# Patient Record
Sex: Male | Born: 1935
Health system: Southern US, Community
[De-identification: ages and names within clinical notes are randomized; demographics above are authoritative.]

## PROBLEM LIST (undated history)

## (undated) DIAGNOSIS — E785 Hyperlipidemia, unspecified: Secondary | ICD-10-CM

## (undated) DIAGNOSIS — C801 Malignant (primary) neoplasm, unspecified: Secondary | ICD-10-CM

## (undated) DIAGNOSIS — J189 Pneumonia, unspecified organism: Secondary | ICD-10-CM

## (undated) DIAGNOSIS — K219 Gastro-esophageal reflux disease without esophagitis: Secondary | ICD-10-CM

## (undated) DIAGNOSIS — M6289 Other specified disorders of muscle: Secondary | ICD-10-CM

## (undated) DIAGNOSIS — Z923 Personal history of irradiation: Secondary | ICD-10-CM

## (undated) DIAGNOSIS — I10 Essential (primary) hypertension: Secondary | ICD-10-CM

## (undated) DIAGNOSIS — K635 Polyp of colon: Secondary | ICD-10-CM

## (undated) DIAGNOSIS — N442 Benign cyst of testis: Secondary | ICD-10-CM

## (undated) HISTORY — PX: KNEE SURGERY: SHX244

## (undated) HISTORY — DX: Personal history of irradiation: Z92.3

## (undated) HISTORY — DX: Hyperlipidemia, unspecified: E78.5

## (undated) HISTORY — DX: Polyp of colon: K63.5

## (undated) HISTORY — PX: LUMBAR LAMINECTOMY: SHX95

## (undated) HISTORY — DX: Gastro-esophageal reflux disease without esophagitis: K21.9

## (undated) HISTORY — PX: CATARACT EXTRACTION: SUR2

## (undated) HISTORY — DX: Essential (primary) hypertension: I10

## (undated) HISTORY — DX: Other specified disorders of muscle: M62.89

## (undated) HISTORY — DX: Pneumonia, unspecified organism: J18.9

## (undated) HISTORY — PX: TRANSURETHRAL RESECTION OF PROSTATE: SHX73

## (undated) HISTORY — DX: Benign cyst of testis: N44.2

## (undated) SURGERY — IMAGING PROCEDURE, GI TRACT, INTRALUMINAL, VIA CAPSULE
Anesthesia: LOCAL

---

## 1998-07-27 ENCOUNTER — Other Ambulatory Visit: Admission: RE | Admit: 1998-07-27 | Discharge: 1998-07-27 | Payer: Self-pay | Admitting: Urology

## 1998-08-16 ENCOUNTER — Inpatient Hospital Stay (HOSPITAL_COMMUNITY): Admission: RE | Admit: 1998-08-16 | Discharge: 1998-08-18 | Payer: Self-pay | Admitting: Urology

## 1999-04-10 ENCOUNTER — Encounter: Payer: Self-pay | Admitting: Specialist

## 1999-04-15 ENCOUNTER — Observation Stay (HOSPITAL_COMMUNITY): Admission: RE | Admit: 1999-04-15 | Discharge: 1999-04-16 | Payer: Self-pay | Admitting: Specialist

## 1999-04-15 ENCOUNTER — Encounter: Payer: Self-pay | Admitting: Specialist

## 1999-04-15 ENCOUNTER — Encounter (INDEPENDENT_AMBULATORY_CARE_PROVIDER_SITE_OTHER): Payer: Self-pay

## 2000-05-19 DIAGNOSIS — J189 Pneumonia, unspecified organism: Secondary | ICD-10-CM

## 2000-05-19 HISTORY — DX: Pneumonia, unspecified organism: J18.9

## 2000-06-11 ENCOUNTER — Inpatient Hospital Stay (HOSPITAL_COMMUNITY): Admission: EM | Admit: 2000-06-11 | Discharge: 2000-06-13 | Payer: Self-pay | Admitting: Emergency Medicine

## 2000-06-11 ENCOUNTER — Encounter: Payer: Self-pay | Admitting: Emergency Medicine

## 2000-06-12 ENCOUNTER — Encounter: Payer: Self-pay | Admitting: Internal Medicine

## 2000-07-09 ENCOUNTER — Encounter (INDEPENDENT_AMBULATORY_CARE_PROVIDER_SITE_OTHER): Payer: Self-pay | Admitting: Specialist

## 2000-07-09 ENCOUNTER — Other Ambulatory Visit: Admission: RE | Admit: 2000-07-09 | Discharge: 2000-07-09 | Payer: Self-pay | Admitting: Internal Medicine

## 2002-06-28 ENCOUNTER — Ambulatory Visit (HOSPITAL_COMMUNITY): Admission: RE | Admit: 2002-06-28 | Discharge: 2002-06-28 | Payer: Self-pay | Admitting: Internal Medicine

## 2003-11-06 ENCOUNTER — Encounter: Payer: Self-pay | Admitting: Internal Medicine

## 2003-11-16 ENCOUNTER — Encounter: Payer: Self-pay | Admitting: Internal Medicine

## 2004-05-27 ENCOUNTER — Ambulatory Visit: Payer: Self-pay | Admitting: Internal Medicine

## 2005-09-19 ENCOUNTER — Ambulatory Visit: Payer: Self-pay | Admitting: Internal Medicine

## 2005-10-03 ENCOUNTER — Ambulatory Visit: Payer: Self-pay | Admitting: Internal Medicine

## 2005-11-17 ENCOUNTER — Ambulatory Visit: Payer: Self-pay | Admitting: Internal Medicine

## 2006-04-21 ENCOUNTER — Ambulatory Visit: Payer: Self-pay | Admitting: Internal Medicine

## 2006-10-21 ENCOUNTER — Ambulatory Visit: Payer: Self-pay | Admitting: Internal Medicine

## 2007-04-29 ENCOUNTER — Ambulatory Visit: Payer: Self-pay | Admitting: Internal Medicine

## 2007-04-29 DIAGNOSIS — Z860100 Personal history of colon polyps, unspecified: Secondary | ICD-10-CM | POA: Insufficient documentation

## 2007-04-29 DIAGNOSIS — I10 Essential (primary) hypertension: Secondary | ICD-10-CM

## 2007-04-29 DIAGNOSIS — E785 Hyperlipidemia, unspecified: Secondary | ICD-10-CM

## 2007-04-29 DIAGNOSIS — K219 Gastro-esophageal reflux disease without esophagitis: Secondary | ICD-10-CM

## 2007-04-30 LAB — CONVERTED CEMR LAB
ALT: 21 units/L (ref 0–53)
AST: 24 units/L (ref 0–37)
Albumin: 3.8 g/dL (ref 3.5–5.2)
Alkaline Phosphatase: 47 units/L (ref 39–117)
BUN: 11 mg/dL (ref 6–23)
Bilirubin, Direct: 0.1 mg/dL (ref 0.0–0.3)
CO2: 29 meq/L (ref 19–32)
Calcium: 9 mg/dL (ref 8.4–10.5)
Chloride: 105 meq/L (ref 96–112)
Cholesterol: 179 mg/dL (ref 0–200)
Creatinine, Ser: 1.1 mg/dL (ref 0.4–1.5)
GFR calc Af Amer: 85 mL/min
GFR calc non Af Amer: 70 mL/min
Glucose, Bld: 97 mg/dL (ref 70–99)
HDL: 31.7 mg/dL — ABNORMAL LOW (ref >39.0)
LDL Cholesterol: 119 mg/dL — ABNORMAL HIGH (ref 0–99)
Potassium: 4.5 meq/L (ref 3.5–5.1)
Sodium: 141 meq/L (ref 135–145)
Total Bilirubin: 0.8 mg/dL (ref 0.3–1.2)
Total CHOL/HDL Ratio: 5.6
Total Protein: 6.6 g/dL (ref 6.0–8.3)
Triglycerides: 144 mg/dL (ref 0–149)
VLDL: 29 mg/dL (ref 0–40)

## 2007-10-29 ENCOUNTER — Ambulatory Visit: Payer: Self-pay | Admitting: Internal Medicine

## 2008-02-02 ENCOUNTER — Encounter: Payer: Self-pay | Admitting: Internal Medicine

## 2008-04-28 ENCOUNTER — Ambulatory Visit: Payer: Self-pay | Admitting: Internal Medicine

## 2008-05-01 LAB — CONVERTED CEMR LAB
ALT: 18 units/L (ref 0–53)
AST: 21 units/L (ref 0–37)
Albumin: 4.1 g/dL (ref 3.5–5.2)
Alkaline Phosphatase: 38 units/L — ABNORMAL LOW (ref 39–117)
BUN: 22 mg/dL (ref 6–23)
Bilirubin, Direct: 0.1 mg/dL (ref 0.0–0.3)
CO2: 30 meq/L (ref 19–32)
Calcium: 9.4 mg/dL (ref 8.4–10.5)
Chloride: 101 meq/L (ref 96–112)
Cholesterol: 174 mg/dL (ref 0–200)
Creatinine, Ser: 1.3 mg/dL (ref 0.4–1.5)
Direct LDL: 104.9 mg/dL
GFR calc Af Amer: 70 mL/min
GFR calc non Af Amer: 58 mL/min
Glucose, Bld: 88 mg/dL (ref 70–99)
HDL: 31.9 mg/dL — ABNORMAL LOW (ref >39.0)
PSA: 0.93 ng/mL (ref 0.10–4.00)
Potassium: 4.5 meq/L (ref 3.5–5.1)
Sodium: 139 meq/L (ref 135–145)
Total Bilirubin: 0.7 mg/dL (ref 0.3–1.2)
Total CHOL/HDL Ratio: 5.5
Total Protein: 7.1 g/dL (ref 6.0–8.3)
Triglycerides: 227 mg/dL (ref 0–149)
VLDL: 45 mg/dL — ABNORMAL HIGH (ref 0–40)

## 2008-09-06 ENCOUNTER — Encounter (INDEPENDENT_AMBULATORY_CARE_PROVIDER_SITE_OTHER): Payer: Self-pay | Admitting: *Deleted

## 2008-10-24 ENCOUNTER — Ambulatory Visit: Payer: Self-pay | Admitting: Internal Medicine

## 2008-11-07 ENCOUNTER — Ambulatory Visit: Payer: Self-pay | Admitting: Internal Medicine

## 2009-04-27 ENCOUNTER — Encounter: Payer: Self-pay | Admitting: Internal Medicine

## 2009-12-24 ENCOUNTER — Ambulatory Visit: Payer: Self-pay | Admitting: Internal Medicine

## 2009-12-25 LAB — CONVERTED CEMR LAB
ALT: 16 units/L (ref 0–53)
AST: 19 units/L (ref 0–37)
Albumin: 4.3 g/dL (ref 3.5–5.2)
Alkaline Phosphatase: 40 units/L (ref 39–117)
BUN: 21 mg/dL (ref 6–23)
Basophils Absolute: 0 10*3/uL (ref 0.0–0.1)
Basophils Relative: 0.2 % (ref 0.0–3.0)
Bilirubin, Direct: 0.1 mg/dL (ref 0.0–0.3)
CO2: 30 meq/L (ref 19–32)
Calcium: 9.1 mg/dL (ref 8.4–10.5)
Chloride: 104 meq/L (ref 96–112)
Cholesterol: 167 mg/dL (ref 0–200)
Creatinine, Ser: 1.2 mg/dL (ref 0.4–1.5)
Direct LDL: 93.8 mg/dL
Eosinophils Absolute: 0.1 10*3/uL (ref 0.0–0.7)
Eosinophils Relative: 2.1 % (ref 0.0–5.0)
GFR calc non Af Amer: 61.84 mL/min (ref >60)
Glucose, Bld: 97 mg/dL (ref 70–99)
HCT: 42.1 % (ref 39.0–52.0)
HDL: 36.2 mg/dL — ABNORMAL LOW (ref >39.00)
Hemoglobin: 14.4 g/dL (ref 13.0–17.0)
Lymphocytes Relative: 33.5 % (ref 12.0–46.0)
Lymphs Abs: 2.2 10*3/uL (ref 0.7–4.0)
MCHC: 34.2 g/dL (ref 30.0–36.0)
MCV: 96.8 fL (ref 78.0–100.0)
Monocytes Absolute: 0.4 10*3/uL (ref 0.1–1.0)
Monocytes Relative: 6.7 % (ref 3.0–12.0)
Neutro Abs: 3.7 10*3/uL (ref 1.4–7.7)
Neutrophils Relative %: 57.5 % (ref 43.0–77.0)
PSA: 1.01 ng/mL (ref 0.10–4.00)
Platelets: 208 10*3/uL (ref 150.0–400.0)
Potassium: 4.6 meq/L (ref 3.5–5.1)
RBC: 4.35 M/uL (ref 4.22–5.81)
RDW: 13 % (ref 11.5–14.6)
Sodium: 143 meq/L (ref 135–145)
TSH: 2.77 microintl units/mL (ref 0.35–5.50)
Total Bilirubin: 0.6 mg/dL (ref 0.3–1.2)
Total CHOL/HDL Ratio: 5
Total Protein: 6.7 g/dL (ref 6.0–8.3)
Triglycerides: 209 mg/dL — ABNORMAL HIGH (ref 0.0–149.0)
VLDL: 41.8 mg/dL — ABNORMAL HIGH (ref 0.0–40.0)
WBC: 6.5 10*3/uL (ref 4.5–10.5)

## 2010-06-18 NOTE — Letter (Signed)
Summary: Request for Surgical Clearance/[facility]  Request for Surgical Clearance/[facility]   Imported By: Maryln Gottron 06/01/2009 09:26:00  _____________________________________________________________________  External Attachment:    Type:   Image     Comment:   External Document

## 2010-06-18 NOTE — Assessment & Plan Note (Signed)
Summary: emp/pt coming in fasting/cjr   Vital Signs:  Patient profile:   76 year old male Height:      71.5 inches Weight:      200 pounds BMI:     27.60 Pulse rate:   60 / minute Pulse rhythm:   regular Resp:     12 per minute BP sitting:   144 / 78  (left arm) Cuff size:   regular  Vitals Entered By: Gladis Riffle, RN (December 24, 2009 9:51 AM) CC: annual review of systems, fasting--needs Rx prilosec and HCTZ Is Patient Diabetic? No   CC:  annual review of systems and fasting--needs Rx prilosec and HCTZ.  History of Present Illness: Here for Medicare AWV:  1.   Risk factors based on Past M, S, F history: see list 2.   Physical Activities: --able to do all activities 3.   Depression/mood: ---denies 4.   Hearing: --denies complaints 5.   ADL's: --able to do all  6.   Fall Risk: --none noted 7.   Home Safety: ---no concerns 8.   Height, weight, &visual acuity:---see exam---q 6 month ophthalmologic exam 9.   Counseling: --advised regular exercise---he walks regularly 10.   Labs ordered based on risk factors: --see orders 11.           Referral Coordination---none necessary 12.           Care Plan--- advised regular exercise 13.            Cognitive Assessment---pt  is alert, motor and sensory functions are intact  Current Problems:  HYPERLIPIDEMIA (ICD-272.4): chronically low hdl despite normal weight and regular exercise HYPERTENSION (ICD-401.9): tolerating meds without difficulty GERD (ICD-530.81): no sxs on PPI    All other systems reviewed and were negative    Preventive Screening-Counseling & Management  Alcohol-Tobacco     Smoking Status: quit  Current Problems (verified): 1)  Special Screening Malignant Neoplasm of Prostate  (ICD-V76.44) 2)  Hyperlipidemia  (ICD-272.4) 3)  Hypertension  (ICD-401.9) 4)  Gerd  (ICD-530.81) 5)  Colonic Polyps, Hx of  (ICD-V12.72)  Current Medications (verified): 1)  Prilosec Otc 20 Mg  Tbec (Omeprazole Magnesium) .... Take  1 Tablet By Mouth Once A Day 2)  Hydrochlorothiazide 25 Mg  Tabs (Hydrochlorothiazide) .... .1/2tablet Once Daily 3)  Centrum Silver   Tabs (Multiple Vitamins-Minerals) .... Once Daily 4)  Vitamin C Cr 500 Mg  Tbcr (Ascorbic Acid) .... Once Daily 5)  Fish Oil   Oil (Fish Oil) .... Once Daily 6)  Icaps  Caps (Multiple Vitamins-Minerals) .... Two Daily 7)  Antioxidant A/c/e   Tabs (Multiple Vitamin)  Allergies (verified): No Known Drug Allergies  Past History:  Past Medical History: Colonic polyps, hx of GERD Hypertension pnueumonia 2002 Hyperlipidemia macular degeneration  Past Surgical History: knee surgery testicular cyst Lumbar laminectomy Transurethral resection of prostate Cataract extraction--left eye  Physical Exam  General:  Well-developed,well-nourished,in no acute distress; alert,appropriate and cooperative throughout examination Head:  normocephalic and atraumatic.   Eyes:  pupils equal and pupils round.   Ears:  R ear normal and L ear normal.   Nose:  no external deformity and no external erythema.   Neck:  No deformities, masses, or tenderness noted. Lungs:  normal respiratory effort and no intercostal retractions.   Heart:  normal rate and regular rhythm.   Abdomen:  soft and non-tender.   Msk:  No deformity or scoliosis noted of thoracic or lumbar spine.   Pulses:  R radial  normal and L radial normal.   Neurologic:  cranial nerves II-XII intact and gait normal.   Skin:  turgor normal and color normal.   Cervical Nodes:  no anterior cervical adenopathy and no posterior cervical adenopathy.   Psych:  memory intact for recent and remote and good eye contact.     Impression & Recommendations:  Problem # 1:  HYPERLIPIDEMIA (ICD-272.4)  real issue is HDL continue regular exercise  Labs Reviewed: SGOT: 21 (04/28/2008)   SGPT: 18 (04/28/2008)   HDL:31.9 (04/28/2008), 31.7 (04/29/2007)  LDL:DEL (04/28/2008), 119 (04/29/2007)  Chol:174 (04/28/2008), 179  (04/29/2007)  Trig:227 (04/28/2008), 144 (04/29/2007)  Orders: TLB-Lipid Panel (80061-LIPID) TLB-Hepatic/Liver Function Pnl (80076-HEPATIC) TLB-TSH (Thyroid Stimulating Hormone) (84443-TSH)  Problem # 2:  HYPERTENSION (ICD-401.9)  reasonable control continue current medications  His updated medication list for this problem includes:    Hydrochlorothiazide 25 Mg Tabs (Hydrochlorothiazide) ..... .1/2tablet once daily  BP today: 144/78 Prior BP: 110/80 (04/28/2008)  Labs Reviewed: K+: 4.5 (04/28/2008) Creat: : 1.3 (04/28/2008)   Chol: 174 (04/28/2008)   HDL: 31.9 (04/28/2008)   LDL: DEL (04/28/2008)   TG: 227 (04/28/2008)  Orders: TLB-BMP (Basic Metabolic Panel-BMET) (80048-METABOL)  Problem # 3:  GERD (ICD-530.81)  well controlled continue current medications  His updated medication list for this problem includes:    Prilosec Otc 20 Mg Tbec (Omeprazole magnesium) .Marland Kitchen... Take 1 tablet by mouth once a day  Orders: TLB-CBC Platelet - w/Differential (85025-CBCD)  Problem # 4:  PREVENTIVE HEALTH CARE (ICD-V70.0) health maint utd check labs today Colonoscopy: Location:  [facility].   (11/07/2008) Flu Vax: fluvirin (02/01/2008)   Pneumovax: pt's report (05/19/2002) Chol: 174 (04/28/2008)   HDL: 31.9 (04/28/2008)   LDL: DEL (04/28/2008)   TG: 227 (04/28/2008) PSA: 0.93 (04/28/2008) Next Colonoscopy due:: 11/2015 (11/07/2008)  Complete Medication List: 1)  Prilosec Otc 20 Mg Tbec (Omeprazole magnesium) .... Take 1 tablet by mouth once a day 2)  Hydrochlorothiazide 25 Mg Tabs (Hydrochlorothiazide) .... .1/2tablet once daily 3)  Centrum Silver Tabs (Multiple vitamins-minerals) .... Once daily 4)  Vitamin C Cr 500 Mg Tbcr (Ascorbic acid) .... Once daily 5)  Fish Oil Oil (Fish oil) .... Once daily 6)  Icaps Caps (Multiple vitamins-minerals) .... Two daily 7)  Antioxidant A/c/e Tabs (Multiple vitamin)  Other Orders: UA Dipstick w/o Micro (automated)   (81003) TLB-PSA (Prostate Specific Antigen) (84153-PSA)  Prescriptions: HYDROCHLOROTHIAZIDE 25 MG  TABS (HYDROCHLOROTHIAZIDE) .1/2tablet once daily  #90 x 3   Entered and Authorized by:   Birdie Sons MD   Signed by:   Birdie Sons MD on 12/24/2009   Method used:   Print then Give to Patient   RxID:   [id number] PRILOSEC OTC 20 MG  TBEC (OMEPRAZOLE MAGNESIUM) Take 1 tablet by mouth once a day  #90 x 3   Entered and Authorized by:   Birdie Sons MD   Signed by:   Birdie Sons MD on 12/24/2009   Method used:   Print then Give to Patient   RxID:   [id number]   Appended Document: emp/pt coming in fasting/cjr   Immunizations Administered:  Tetanus Vaccine:    Vaccine Type: Td    Site: left deltoid    Mfr: Sanofi Pasteur    Dose: 0.5 ml    Route: IM    Given by: Gladis Riffle, RN    Exp. Date: 07/21/2010    Lot #: [id number]   Appended Document: Orders Update    Clinical Lists Changes  Orders: Added new  Service order of Specimen Handling ([id number]) - Signed      Appended Document: emp/pt coming in fasting/cjr  Laboratory Results   Urine Tests    Routine Urinalysis   Color: yellow Appearance: Clear Glucose: negative   (Normal Range: Negative) Bilirubin: negative   (Normal Range: Negative) Ketone: negative   (Normal Range: Negative) Spec. Gravity: 1.020   (Normal Range: 1.003-1.035) Blood: negative   (Normal Range: Negative) pH: 6.0   (Normal Range: 5.0-8.0) Protein: negative   (Normal Range: Negative) Urobilinogen: 0.2   (Normal Range: 0-1) Nitrite: negative   (Normal Range: Negative) Leukocyte Esterace: negative   (Normal Range: Negative)    Comments: Rita Ohara  December 24, 2009 3:51 PM

## 2010-10-04 NOTE — Op Note (Signed)
   NAME:  LOGON, UTTECH                        ACCOUNT NO.:  [id number]   MEDICAL RECORD NO.:  [id number]                   PATIENT TYPE:  AMB   LOCATION:  ENDO                                 FACILITY:  [facility]   PHYSICIAN:  Lina Sar, M.D. [facility]               DATE OF BIRTH:  [birth year]   DATE OF PROCEDURE:  DATE OF DISCHARGE:                                 OPERATIVE REPORT   NAME OF PROCEDURE:  Flexible sigmoidoscopy and banding of hemorrhoids.   INDICATIONS:  This 76 year old gentleman has had symptomatic hemorrhoids  which have not responded to conservative measures.  These were found on last  colonoscopy in February 2002.  He has been treated with Anusol suppositories  and reexamination in December 2003 and in January 2004 hemorrhoids were  bleeding, first to second grade internal hemorrhoids.  He is now undergoing  banding after failure of conservative treatment.   ENDOSCOPE:  Olympus single channel video endoscope.   SEDATION:  Versed 5 mg IV, Demerol 25 mg IV.   FINDINGS:  Olympus single channel video endoscope passed under direct vision  through rectum to sigmoid colon.  The patient was monitored by pulse  oximeter.  His oxygen saturations were normal.  Prep was excellent.  Anal  canal was normal with first to second grade internal hemorrhoids.  At least  three or four of them were seen on retroflexed view of the rectal ampulla.  The sigmoid colon itself was normal up to 35 cm.  Endoscope was then brought  back into the rectum, retroflexed, and using a 6-0 Furniture conservator/restorer, four of the hemorrhoids were banded from retroflex view.  There was  minimal amount of bleeding from the banded hemorrhoids.  Reinspection of the  rectal ampulla confirmed successful banding of the four hemorrhoids.  The  patient tolerated the procedure well.   IMPRESSION:  Second grade internal hemorrhoids status post banding, band  ligation.   PLAN:  1. Anusol suppositories q.h.s.  x1 week.  2. Reinspection of the hemorrhoids in six weeks.  This may be a two-stage     procedure to make sure that all hemorrhoids have been banded.                                               Lina Sar, M.D. [facility]    DB/MEDQ  D:  06/28/2002  T:  06/28/2002  Job:  [id number]

## 2010-10-04 NOTE — H&P (Signed)
[facility]  Patient:    Tim Ross, Tim Ross                    MRN: [id number] Adm. Date:  06/11/00 Attending:  Titus Dubin. Alwyn Ren, M.D. [facility]                         History and Physical  REASON FOR ADMISSION:  Tim Ross is a 76 year old white male admitted with FUO.  HISTORY OF PRESENT ILLNESS:  His symptoms began approximately 10 days ago with onset of diarrhea for two days.  Following frequent bowel movements, he did have blood on toilet tissue, but denied rectal bleeding or melena.  He has had intermittent fever since.  In the last 24 hours his fevers have been 102 to 103.4, associated with rigor and chills.  For the last 2-3 days he has had shortness of breath without cough or sputum production.  He denied any other localizing signs or symptoms related to the respiratory tract, GI system or genitourinary system.  Specifically, there have been no purulent secretions, rash or localized erythema or edema.  PAST SURGICAL HISTORY:  He has had back surgery.  He also had knee surgery bilaterally.  On the left knee the procedure was an open procedure.  He has also had "testicular cyst" removed.  MEDICATIONS:  Presently he is on Nexium which was begun June 08, 2000 when he was seen by Dr. Arthur Holms with some epigastric pain.  ALLERGIES:  No known drug allergies.  SOCIAL HISTORY:  He no longer smokes or drinks.  FAMILY HISTORY:  Positive for myocardial infarction in his father, diabetes in his mother and cancer of the colon in an aunt.  He has had colon polyps removed by Dr. Lina Sar.  PHYSICAL EXAMINATION:  GENERAL:  On exam at this time he appears somewhat weak but in no acute distress.  VITAL SIGNS:  At the time of admission his temperature was 101.6, respiratory rate 22, pulse 116 and blood pressure 137/79.  He received Motrin with resolution of his fever.  His pulse is now 65 and regular.  Blood pressure is now 102/65.  O2 saturations are  96% on room air.  HEENT:  Fundal exam is normal.  Thyroid is small.  Otolaryngologic exam is unremarkable.  NECK:  He has no lymphadenopathy about the head, neck or axilla.  There are no carotid bruits.  CHEST:  Essentially clear with only minimal rales at the bases.  Dr. ______ stated he had heard rales, which apparently have cleared.  CARDIOVASCULAR:  He has an S4 without significant murmur.  ABDOMEN:  Nontender with normal bowel sounds.  There is no abdominal tenderness to deep palpation.  GENITOURINARY:  There are some minor varicocele changes in the right scrotum. There is a suggestion of a possible prostatic nodule on the right.  RECTAL:  There is no periprostate tenderness or pain on rectal exam.  EXTREMITIES:  Pedal pulses are intact but slightly decreased.  He has no edema.  Homans sign is negative.  SKIN:  No significant skin lesion were noted.  LABORATORY DATA:  His PO2 on 2 liters was 68 with a PCO2 of 36.  Urine revealed 0-5 white cells and 6-7 red cells.  White count is 7000 with 9% monos.  Potassium is 3.4, glucose 133 and calcium 8.3.  The remainder of the labs are normal.  ADMITTING IMPRESSION: 1. Fever of unknown origin. 2. Hypoxemia. 3. Diarrhea,  resolved.  This may represent a self-limited diverticulitis, but at this time there ear no localizing signs or symptoms, and as stated, the diarrhea has resolved.  He will be admitted for monitoring with blood cultures for temperature spikes. He has been started on Tequin and this will be continued.  If fevers persist, then a 2-D echocardiogram will be performed, although there is no murmur at this time.  Additionally, a follow-up film will be performed after he has been hydrated, as he has been febrile intermittently for 10 days.  ADDENDUM:  He has well water, which his wife drinks as well.  She is well. They have no sick pets and there is no significant travel exposure. DD:  06/11/00 TD:  06/11/00 Job:  [id number]

## 2010-10-04 NOTE — Discharge Summary (Signed)
[facility]  Patient:    Tim Ross, Tim Ross                     MRN: [id number] Adm. Date:  [id number] Disc. Date: [id number] Attending:  Dolores Patty CC:         Titus Dubin. Alwyn Ren, M.D. [facility]   Discharge Summary  DISCHARGE DIAGNOSES: 1. Right lower lobe pneumonia. 2. Fever secondary to above. 3. History of back surgery. 4. Bilateral knee surgery. 5. History of a testicular cyst removal.  DISCHARGE MEDICATIONS: 1. Nexium 40 mg p.o. q.d. 2. Tequin 400 mg p.o. q.d.  DISCHARGE CONDITION:  Improved.  FOLLOW-UP PLANS:  Dr. Alwyn Ren in two to three weeks (chest x-ray may need to be repeated).  HOSPITAL PROCEDURES:  Chest x-ray on June 12, 2000, demonstrated possible right lower lobe pneumonia.  The streaky opacities seen in the right lower lobe were not present on the admission chest x-ray of June 11, 2000.  Hospital laboratories:  Urine and blood cultures negative x 1 day.  PSA 0.88, free PSA 0.2%, free is 23%.  Urine microscopy: 6-10 red blood cells per high power field.  CBC on admission.  Urinalysis on admission demonstrated a trace ketones, moderate blood.  HOSPITAL COURSE:  The patient was admitted to the hospital service on June 11, 2000, by Dr. Alwyn Ren.  See his hospital note for details.  He was admitted with possible FUO.  The patient had appropriate studies performed, and then was started on IV Tequin.  The patient started to feel markedly better on June 12, 2000.  At that time, he was tapered off of his oxygen, medications changed to oral antibiotics.  Chest x-ray revealed right lower lobe pneumonia. At the time of discharge, the patient had no complaints, was well and was ambulating and eating without difficulty.  Genitourinary:  The patient was apparently noted to have a prostatic nodule on examination by Dr. Alwyn Ren.  PSA as above.  He did have 6-10 red blood cells per high power field.  This can be followed up as  an outpatient. DD:  06/13/00 TD:  06/13/00 Job: [id number]

## 2010-12-20 ENCOUNTER — Encounter: Payer: Self-pay | Admitting: Internal Medicine

## 2010-12-30 ENCOUNTER — Ambulatory Visit (INDEPENDENT_AMBULATORY_CARE_PROVIDER_SITE_OTHER): Payer: Medicare Other | Admitting: Internal Medicine

## 2010-12-30 ENCOUNTER — Encounter: Payer: Self-pay | Admitting: Internal Medicine

## 2010-12-30 DIAGNOSIS — Z0000 Encounter for general adult medical examination without abnormal findings: Secondary | ICD-10-CM

## 2010-12-30 DIAGNOSIS — K219 Gastro-esophageal reflux disease without esophagitis: Secondary | ICD-10-CM

## 2010-12-30 DIAGNOSIS — Z125 Encounter for screening for malignant neoplasm of prostate: Secondary | ICD-10-CM

## 2010-12-30 DIAGNOSIS — Z23 Encounter for immunization: Secondary | ICD-10-CM

## 2010-12-30 DIAGNOSIS — E785 Hyperlipidemia, unspecified: Secondary | ICD-10-CM

## 2010-12-30 DIAGNOSIS — I10 Essential (primary) hypertension: Secondary | ICD-10-CM

## 2010-12-30 LAB — CBC WITH DIFFERENTIAL/PLATELET
Basophils Relative: 0.2 % (ref 0.0–3.0)
Eosinophils Absolute: 0.2 10*3/uL (ref 0.0–0.7)
MCHC: 33.5 g/dL (ref 30.0–36.0)
MCV: 96.7 fl (ref 78.0–100.0)
Monocytes Absolute: 0.4 10*3/uL (ref 0.1–1.0)
Neutrophils Relative %: 60.1 % (ref 43.0–77.0)
Platelets: 216 10*3/uL (ref 150.0–400.0)
RBC: 4.48 Mil/uL (ref 4.22–5.81)
RDW: 12.6 % (ref 11.5–14.6)

## 2010-12-30 LAB — HEPATIC FUNCTION PANEL
ALT: 20 U/L (ref 0–53)
Bilirubin, Direct: 0.1 mg/dL (ref 0.0–0.3)
Total Bilirubin: 0.9 mg/dL (ref 0.3–1.2)
Total Protein: 6.8 g/dL (ref 6.0–8.3)

## 2010-12-30 LAB — LIPID PANEL
HDL: 39.9 mg/dL (ref >39.00)
LDL Cholesterol: 112 mg/dL — ABNORMAL HIGH (ref 0–99)
VLDL: 21.6 mg/dL (ref 0.0–40.0)

## 2010-12-30 LAB — POCT URINALYSIS DIPSTICK
Glucose, UA: NEGATIVE
Ketones, UA: NEGATIVE
Leukocytes, UA: NEGATIVE
Protein, UA: NEGATIVE
Urobilinogen, UA: 0.2

## 2010-12-30 LAB — TSH: TSH: 3.49 u[IU]/mL (ref 0.35–5.50)

## 2010-12-30 LAB — PSA: PSA: 1.2 ng/mL (ref 0.10–4.00)

## 2010-12-30 LAB — BASIC METABOLIC PANEL
Calcium: 9.1 mg/dL (ref 8.4–10.5)
Creatinine, Ser: 1.3 mg/dL (ref 0.4–1.5)
Sodium: 138 mEq/L (ref 135–145)

## 2010-12-30 MED ORDER — OMEPRAZOLE 20 MG PO CPDR: 20.0000 mg | DELAYED_RELEASE_CAPSULE | Freq: Every day | ORAL | Status: DC

## 2010-12-30 MED ORDER — HYDROCHLOROTHIAZIDE 25 MG PO TABS: 25.0000 mg | ORAL_TABLET | Freq: Every day | ORAL | Status: DC

## 2010-12-30 NOTE — Assessment & Plan Note (Signed)
No sxs Check labs today 

## 2010-12-30 NOTE — Assessment & Plan Note (Signed)
Adequate control--check labs today

## 2010-12-30 NOTE — Progress Notes (Signed)
Subjective:    Tim Ross is a 76 y.o. male who presents for Medicare Initial preventive examination.   In addition. Pt has the following problems: Patient Active Problem List  Diagnoses  . HYPERLIPIDEMIA---no treatment  . HYPERTENSION--tolerating hctz  . GERD--needs Rx    Preventive Screening-Counseling & Management  Tobacco History  Smoking status  . Former Smoker -- 40 years  . Types: Cigarettes  . Quit date: 12/30/1990  Smokeless tobacco  . Not on file    Problems Prior to Visit 1.   Current Problems (verified) Patient Active Problem List  Diagnoses  . HYPERLIPIDEMIA  . HYPERTENSION  . GERD  . COLONIC POLYPS, HX OF    Medications Prior to Visit Current Outpatient Prescriptions on File Prior to Visit  Medication Sig Dispense Refill  . fish oil-omega-3 fatty acids 1000 MG capsule Take 1 g by mouth daily.        . Multiple Vitamin (MULTIVITAMIN) tablet Take 1 tablet by mouth daily.        . vitamin C (ASCORBIC ACID) 500 MG tablet Take 500 mg by mouth daily.          Current Medications (verified) Current Outpatient Prescriptions  Medication Sig Dispense Refill  . fish oil-omega-3 fatty acids 1000 MG capsule Take 1 g by mouth daily.        . hydrochlorothiazide 25 MG tablet Take 1 tablet (25 mg total) by mouth daily.  90 tablet  3  . Multiple Vitamin (MULTIVITAMIN) tablet Take 1 tablet by mouth daily.        Marland Kitchen omeprazole (PRILOSEC) 20 MG capsule Take 1 capsule (20 mg total) by mouth daily.  90 capsule  3  . vitamin C (ASCORBIC ACID) 500 MG tablet Take 500 mg by mouth daily.           Allergies (verified) Review of patient's allergies indicates no known allergies.   PAST HISTORY  Family History Family History  Problem Relation Age of Onset  . Diabetes Mother   . Hypertension Mother   . Heart attack Father   . Heart disease Brother     Social History History  Substance Use Topics  . Smoking status: Former Smoker -- 40 years    Types:  Cigarettes    Quit date: 12/30/1990  . Smokeless tobacco: Not on file  . Alcohol Use: No    Are there smokers in your home (other than you)?  No  Risk Factors Current exercise habits: walks every morning  Dietary issues discussed: he eats a gealthy diet   Cardiac risk factors: advanced age (older than 67 for men, 22 for women) and dyslipidemia.  Depression Screen (Note: if answer to either of the following is "Yes", a more complete depression screening is indicated)   Q1: Over the past two weeks, have you felt down, depressed or hopeless? No Activities of Daily Living In your present state of health, do you have any difficulty performing the following activities?:  Driving?no Managing money?  no Feeding yourself? no Getting from bed to chair? no Climbing a flight of stairs? no Preparing food and eating?: No Bathing or showering? No   Hearing Difficulties: No Do you feel that you have a problem with memory? No Cognitive Testing  Alert? Yes  Normal Appearance?Yes  Oriented to person? Yes  Place? Yes   Time? Yes Advanced Directives have been discussed with the patient? No  Immunization History  Administered Date(s) Administered  . Influenza Whole 02/17/2007, 02/01/2008, 02/16/2010  .  Td 12/24/2009  . Zoster 05/19/2005    Screening Tests Health Maintenance  Topic Date Due  . Pneumococcal Polysaccharide Vaccine Age 71 And Over  09/15/2001  . Influenza Vaccine  02/17/2011  . Colonoscopy  11/08/2018  . Tetanus/tdap  12/25/2019  . Zostavax  Completed    All answers were reviewed with the patient and necessary referrals were made:  Judie Petit, MD   12/30/2010   History reviewed: allergies, current medications, past family history, past medical history, past social history, past surgical history and problem list  Review of Systems  patient denies chest pain, shortness of breath, orthopnea. Denies lower extremity edema, abdominal pain, change in appetite, change in  bowel movements. Patient denies rashes, musculoskeletal complaints. No other specific complaints in a complete review of systems. Tick bite May...still has as nodule which can be pruritic  Objective:      Blood pressure 142/84, pulse 68, temperature 97.1 F (36.2 C), temperature source Oral, height 6' (1.829 m), weight 196 lb (88.905 kg). Body mass index is 26.58 kg/(m^2).     Well-developed male in no acute distress. HEENT exam atraumatic, normocephalic, extraocular muscles are intact. Conjunctivae are pink without exudate. Neck is supple without lymphadenopathy, thyromegaly, jugular venous distention. Chest is clear to auscultation without increased work of breathing. Cardiac exam S1-S2 are regular. The PMI is normal. No significant murmurs or gallops. Abdominal exam active bowel sounds, soft, nontender. No abdominal bruits. Extremities no clubbing cyanosis or edema. Peripheral pulses are normal without bruits. Neurologic exam alert and oriented without any motor or sensory deficits. Rectal exam normal tone prostate normal size without masses or asymmetry.  Assessment:  Well visit        Plan:     During the course of the visit the patient was educated and counseled about appropriate screening and preventive services including:    Pneumococcal vaccine   Influenza vaccine  Td vaccine  Prostate cancer screening  Colorectal cancer screening  Diet review for nutrition referral? Yes ____  Not Indicated ___X_   Patient Instructions (the written plan) was given to the patient.  Medicare Attestation I have personally reviewed: The patient's medical and social history Their use of alcohol, tobacco or illicit drugs Their current medications and supplements The patient's functional ability including ADLs,fall risks, home safety risks, cognitive, and hearing and visual impairment Diet and physical activities Evidence for depression or mood disorders  The patient's weight,  height, BMI, and visual acuity have been recorded in the chart.  I have made referrals, counseling, and provided education to the patient based on review of the above and I have provided the patient with a written personalized care plan for preventive services.     Judie Petit, MD   12/30/2010

## 2010-12-30 NOTE — Assessment & Plan Note (Signed)
This has really been a problem of dyslipidemia. Will check today

## 2011-07-03 DIAGNOSIS — H353190 Nonexudative age-related macular degeneration, unspecified eye, stage unspecified: Secondary | ICD-10-CM | POA: Diagnosis not present

## 2011-07-03 DIAGNOSIS — H2510 Age-related nuclear cataract, unspecified eye: Secondary | ICD-10-CM | POA: Diagnosis not present

## 2011-07-03 DIAGNOSIS — H35369 Drusen (degenerative) of macula, unspecified eye: Secondary | ICD-10-CM | POA: Diagnosis not present

## 2011-07-03 DIAGNOSIS — H40029 Open angle with borderline findings, high risk, unspecified eye: Secondary | ICD-10-CM | POA: Diagnosis not present

## 2011-09-17 DIAGNOSIS — H40029 Open angle with borderline findings, high risk, unspecified eye: Secondary | ICD-10-CM | POA: Diagnosis not present

## 2011-12-31 ENCOUNTER — Ambulatory Visit (INDEPENDENT_AMBULATORY_CARE_PROVIDER_SITE_OTHER): Payer: Medicare Other | Admitting: Internal Medicine

## 2011-12-31 VITALS — BP 138/82 | HR 54 | Temp 98.1°F | Wt 197.0 lb

## 2011-12-31 DIAGNOSIS — K219 Gastro-esophageal reflux disease without esophagitis: Secondary | ICD-10-CM

## 2011-12-31 DIAGNOSIS — E785 Hyperlipidemia, unspecified: Secondary | ICD-10-CM | POA: Diagnosis not present

## 2011-12-31 DIAGNOSIS — I10 Essential (primary) hypertension: Secondary | ICD-10-CM | POA: Diagnosis not present

## 2011-12-31 LAB — LIPID PANEL: Cholesterol: 165 mg/dL (ref 0–200)

## 2011-12-31 MED ORDER — OMEPRAZOLE 20 MG PO CPDR: 20.0000 mg | DELAYED_RELEASE_CAPSULE | Freq: Every day | ORAL | Status: DC

## 2011-12-31 NOTE — Assessment & Plan Note (Signed)
Lab Results  Component Value Date   CHOL 173 12/30/2010   HDL 39.90 12/30/2010   LDLCALC 112* 12/30/2010   LDLDIRECT 93.8 12/24/2009   TRIG 108.0 12/30/2010   CHOLHDL 4 12/30/2010   BP Readings from Last 3 Encounters:  12/31/11 138/82  12/30/10 142/84  12/24/09 144/78   Reasonable control-- continue same meds

## 2011-12-31 NOTE — Assessment & Plan Note (Signed)
Check labs- CBC Refilled meds

## 2011-12-31 NOTE — Progress Notes (Signed)
Patient ID: Tim Ross, male   DOB: 03/21/1935, 77 y.o.   MRN: [id number] htn- tolerating meds Lipids-- no medications at this time GERD- no sxs  Past Medical History  Diagnosis Date  . GERD (gastroesophageal reflux disease)   . Hypertension   . Hyperlipidemia   . Colonic polyp   . Pneumonia 2002  . Muscular degeneration   . Testicular cyst     History   Social History  . Marital Status: Married    Spouse Name: N/A    Number of Children: N/A  . Years of Education: N/A   Occupational History  . Not on file.   Social History Main Topics  . Smoking status: Former Smoker -- 40 years    Types: Cigarettes    Quit date: 12/30/1990  . Smokeless tobacco: Not on file  . Alcohol Use: No  . Drug Use: No  . Sexually Active: Not on file   Other Topics Concern  . Not on file   Social History Narrative  . No narrative on file    Past Surgical History  Procedure Date  . Knee surgery   . Lumbar laminectomy   . Transurethral resection of prostate   . Cataract extraction     Family History  Problem Relation Age of Onset  . Diabetes Mother   . Hypertension Mother   . Heart attack Father   . Heart disease Brother     No Known Allergies  Current Outpatient Prescriptions on File Prior to Visit  Medication Sig Dispense Refill  . hydrochlorothiazide 25 MG tablet Take 1 tablet (25 mg total) by mouth daily.  90 tablet  3  . Multiple Vitamin (MULTIVITAMIN) tablet Take 1 tablet by mouth daily.        Marland Kitchen omeprazole (PRILOSEC) 20 MG capsule Take 1 capsule (20 mg total) by mouth daily.  90 capsule  3  . vitamin C (ASCORBIC ACID) 500 MG tablet Take 500 mg by mouth daily.           patient denies chest pain, shortness of breath, orthopnea. Denies lower extremity edema, abdominal pain, change in appetite, change in bowel movements. Patient denies rashes, musculoskeletal complaints. No other specific complaints in a complete review of systems.   BP 138/82  Pulse 54  Temp 98.1  F (36.7 C) (Oral)  Wt 197 lb (89.359 kg)  well-developed well-nourished male in no acute distress. HEENT exam atraumatic, normocephalic, neck supple without jugular venous distention. Chest clear to auscultation cardiac exam S1-S2 are regular. Abdominal exam overweight with bowel sounds, soft and nontender. Extremities no edema. Neurologic exam is alert with a normal gait.

## 2011-12-31 NOTE — Assessment & Plan Note (Signed)
Check lipid panel today 

## 2012-01-12 ENCOUNTER — Telehealth: Payer: Self-pay | Admitting: Internal Medicine

## 2012-01-12 MED ORDER — HYDROCHLOROTHIAZIDE 25 MG PO TABS: 25.0000 mg | ORAL_TABLET | Freq: Every day | ORAL | Status: DC

## 2012-01-12 NOTE — Telephone Encounter (Signed)
rx sent in electronically 

## 2012-01-12 NOTE — Telephone Encounter (Signed)
Patient called stating that he need his hydrochlorothiazide called into [facility] in [place]. Please assist.

## 2012-02-23 DIAGNOSIS — Z23 Encounter for immunization: Secondary | ICD-10-CM | POA: Diagnosis not present

## 2012-07-12 ENCOUNTER — Other Ambulatory Visit: Payer: Self-pay | Admitting: Dermatology

## 2012-07-12 DIAGNOSIS — D1801 Hemangioma of skin and subcutaneous tissue: Secondary | ICD-10-CM | POA: Diagnosis not present

## 2012-07-12 DIAGNOSIS — I781 Nevus, non-neoplastic: Secondary | ICD-10-CM | POA: Diagnosis not present

## 2012-07-12 DIAGNOSIS — D485 Neoplasm of uncertain behavior of skin: Secondary | ICD-10-CM | POA: Diagnosis not present

## 2012-07-12 DIAGNOSIS — D239 Other benign neoplasm of skin, unspecified: Secondary | ICD-10-CM | POA: Diagnosis not present

## 2012-07-12 DIAGNOSIS — L821 Other seborrheic keratosis: Secondary | ICD-10-CM | POA: Diagnosis not present

## 2012-10-12 DIAGNOSIS — H2510 Age-related nuclear cataract, unspecified eye: Secondary | ICD-10-CM | POA: Diagnosis not present

## 2012-10-12 DIAGNOSIS — H353190 Nonexudative age-related macular degeneration, unspecified eye, stage unspecified: Secondary | ICD-10-CM | POA: Diagnosis not present

## 2012-10-12 DIAGNOSIS — H26499 Other secondary cataract, unspecified eye: Secondary | ICD-10-CM | POA: Diagnosis not present

## 2012-10-12 DIAGNOSIS — H40019 Open angle with borderline findings, low risk, unspecified eye: Secondary | ICD-10-CM | POA: Diagnosis not present

## 2012-10-21 DIAGNOSIS — H26499 Other secondary cataract, unspecified eye: Secondary | ICD-10-CM | POA: Diagnosis not present

## 2012-10-21 DIAGNOSIS — Z961 Presence of intraocular lens: Secondary | ICD-10-CM | POA: Diagnosis not present

## 2012-10-21 DIAGNOSIS — H2510 Age-related nuclear cataract, unspecified eye: Secondary | ICD-10-CM | POA: Diagnosis not present

## 2012-10-21 DIAGNOSIS — H40019 Open angle with borderline findings, low risk, unspecified eye: Secondary | ICD-10-CM | POA: Diagnosis not present

## 2012-12-29 DIAGNOSIS — M76899 Other specified enthesopathies of unspecified lower limb, excluding foot: Secondary | ICD-10-CM | POA: Diagnosis not present

## 2012-12-29 DIAGNOSIS — M5137 Other intervertebral disc degeneration, lumbosacral region: Secondary | ICD-10-CM | POA: Diagnosis not present

## 2013-01-27 DIAGNOSIS — H02839 Dermatochalasis of unspecified eye, unspecified eyelid: Secondary | ICD-10-CM | POA: Diagnosis not present

## 2013-01-27 DIAGNOSIS — H353190 Nonexudative age-related macular degeneration, unspecified eye, stage unspecified: Secondary | ICD-10-CM | POA: Diagnosis not present

## 2013-01-27 DIAGNOSIS — H40029 Open angle with borderline findings, high risk, unspecified eye: Secondary | ICD-10-CM | POA: Diagnosis not present

## 2013-01-31 ENCOUNTER — Encounter (INDEPENDENT_AMBULATORY_CARE_PROVIDER_SITE_OTHER): Payer: Medicare Other | Admitting: Ophthalmology

## 2013-01-31 DIAGNOSIS — H2510 Age-related nuclear cataract, unspecified eye: Secondary | ICD-10-CM

## 2013-01-31 DIAGNOSIS — H3530 Unspecified macular degeneration: Secondary | ICD-10-CM | POA: Diagnosis not present

## 2013-01-31 DIAGNOSIS — H43819 Vitreous degeneration, unspecified eye: Secondary | ICD-10-CM | POA: Diagnosis not present

## 2013-01-31 DIAGNOSIS — H35039 Hypertensive retinopathy, unspecified eye: Secondary | ICD-10-CM

## 2013-01-31 DIAGNOSIS — I10 Essential (primary) hypertension: Secondary | ICD-10-CM | POA: Diagnosis not present

## 2013-02-02 ENCOUNTER — Telehealth: Payer: Self-pay | Admitting: Internal Medicine

## 2013-02-02 ENCOUNTER — Ambulatory Visit (INDEPENDENT_AMBULATORY_CARE_PROVIDER_SITE_OTHER): Payer: Medicare Other | Admitting: Internal Medicine

## 2013-02-02 ENCOUNTER — Encounter: Payer: Self-pay | Admitting: Internal Medicine

## 2013-02-02 VITALS — BP 128/64 | HR 68 | Temp 98.2°F | Wt 201.0 lb

## 2013-02-02 DIAGNOSIS — M7918 Myalgia, other site: Secondary | ICD-10-CM

## 2013-02-02 DIAGNOSIS — Z23 Encounter for immunization: Secondary | ICD-10-CM | POA: Diagnosis not present

## 2013-02-02 DIAGNOSIS — M25551 Pain in right hip: Secondary | ICD-10-CM

## 2013-02-02 DIAGNOSIS — M25559 Pain in unspecified hip: Secondary | ICD-10-CM

## 2013-02-02 DIAGNOSIS — IMO0001 Reserved for inherently not codable concepts without codable children: Secondary | ICD-10-CM

## 2013-02-02 MED ORDER — CELECOXIB 200 MG PO CAPS: 200.0000 mg | ORAL_CAPSULE | Freq: Every day | ORAL | Status: DC

## 2013-02-02 NOTE — Patient Instructions (Signed)
Can try adding celebrex oince a day woth caution at this time  This is an antiinflammatory   . uncertain  If pt other rx would be helpful  Will try to arrange earlier appt with ortho.

## 2013-02-02 NOTE — Telephone Encounter (Signed)
Patient Information:  Caller Name: EDITH  Phone: [phone]  Patient: Tim Ross, Tim Ross  Gender: Male  DOB: 06/27/1934  Age: 78 Years  PCP: Birdie Sons (Adults only)  Office Follow Up:  Does the office need to follow up with this patient?: No  Instructions For The Office: N/A  RN Note:  Pt was seen by Orthopedic at [facility] 11-16-12, for Right Hip Pain, diagnosed w/ Bursitis, Cortizone shot given, negative xray.  Cortizone helped for approx 2 weeks, pain has worsen than when elevated. Pt has appt w/ Orthopedic on 10-22, unable to get in any sooner per Wife.  Pt's pain becomes unbearable when he tries to get up or bend hip joint, denies injury, numbness or coolness to Right Leg/Hip.  Hip non-injury protocol used in [facility], ED dispo d/t unable to move weight bearing joing. Pt would like to discuss option w/ Dr Cato Mulligan instead of going to ED.  Dr Cato Mulligan unavailable same day or next day.  Appt scheduled at 11:30 w/ Dr Fabian Sharp on 9-17.  Wife verbalized understanding.   Symptoms  Reason For Call & Symptoms: Bursitis F/U  Reviewed Health History In EMR: Yes  Reviewed Medications In EMR: Yes  Reviewed Allergies In EMR: Yes  Reviewed Surgeries / Procedures: Yes  Date of Onset of Symptoms: 01/03/2013  Treatments Tried: Cortizone Shot, Tramadol, Ibuprofen, Xtra strength Tylenol  Treatments Tried Worked: No  Guideline(s) Used:  No Protocol Available - Information Only  No Protocol Available - Sick Adult  Disposition Per Guideline:   See Today or Tomorrow in Office  Reason For Disposition Reached:   Nursing judgment  Advice Given:  N/A  Patient Will Follow Care Advice:  YES  Appointment Scheduled:  02/02/2013 11:30:00 Appointment Scheduled Provider:  Berniece Andreas (Family Practice)

## 2013-02-02 NOTE — Telephone Encounter (Signed)
Noted  

## 2013-02-02 NOTE — Progress Notes (Signed)
Chief Complaint  Patient presents with  . Hip Pain    Rt side.  Started in June.  Has been seen for this before by ortho.  Has received a cortizone shot.  Tramadol does not help.    HPI: Patient comes in today for SDA for  new problem evaluation. PCP NA Has an ongoing problem with what he calls right hip pain. Started in June saw orthopedic doctor Gioffre  a shot of cortisone and a 12 day prednisone taper. It helped a good bit but only lasted for 2 weeks and then his pain slowly came back. He is listed going back for followup but his wife family realize how much pain he was in and call for an [facility] with Dr.Olin however Cant get in untiul October 22  .  Gradually came back  After that.  Pain for   A least a month    X ray showed?busritis and arthrits  advil had not worked when he tried it before not for long.  Back operation years agao.  Tryingtramadol for pain.   No help with this wakes up in the morning thinks he is a little bit better but starts walking or moving around and he has significant pain begins to limp pain is located in the lateral right hip and buttocks area  ROS: See pertinent positives and negatives per HPI. No history of fever falling recent trauma GI bleeding has some hypertension.  Past Medical History  Diagnosis Date  . GERD (gastroesophageal reflux disease)   . Hypertension   . Hyperlipidemia   . Colonic polyp   . Pneumonia 2002  . Muscular degeneration   . Testicular cyst     Family History  Problem Relation Age of Onset  . Diabetes Mother   . Hypertension Mother   . Heart attack Father   . Heart disease Brother     History   Social History  . Marital Status: Married    Spouse Name: N/A    Number of Children: N/A  . Years of Education: N/A   Social History Main Topics  . Smoking status: Former Smoker -- 40 years    Types: Cigarettes    Quit date: 12/30/1990  . Smokeless tobacco: None  . Alcohol Use: No  . Drug Use: No  .  Sexual Activity: None   Other Topics Concern  . None   Social History Narrative  . None    Outpatient Encounter Prescriptions as of 02/02/2013  Medication Sig Dispense Refill  . hydrochlorothiazide (HYDRODIURIL) 25 MG tablet Take 1 tablet (25 mg total) by mouth daily.  90 tablet  3  . Multiple Vitamin (MULTIVITAMIN) tablet Take 1 tablet by mouth daily.        . Multiple Vitamins-Minerals (ICAPS) CAPS Take by mouth.      . Omega-3 Fatty Acids (FISH OIL) 1000 MG CPDR Take by mouth.      Marland Kitchen omeprazole (PRILOSEC) 20 MG capsule Take 1 capsule (20 mg total) by mouth daily.  90 capsule  3  . vitamin C (ASCORBIC ACID) 500 MG tablet Take 500 mg by mouth daily.        Marland Kitchen VITAMIN E PO Take by mouth.      . celecoxib (CELEBREX) 200 MG capsule Take 1 capsule (200 mg total) by mouth daily.  9 capsule  0   No facility-administered encounter medications on file as of 02/02/2013.    EXAM:  BP 128/64  Pulse 68  Temp(Src) 98.2  F (36.8 C) (Oral)  Wt 201 lb (91.173 kg)  BMI 27.25 kg/m2  SpO2 97%  Body mass index is 27.25 kg/(m^2).  GENERAL: vitals reviewed and listed above, alert, oriented, appears well hydrated and in no acute distress ear with his wife nontoxic cognitively intact walks with a limp HEENT: atraumatic, conjunctiva  clear, no obvious abnormalities on inspection of external nose and ears  NECK: no obvious masses on inspection palpation  MS: moves all extremities tenderness right middle buttocks area negative midline well-healed scar on the back range of motion of joint is adequate toe and heel walk is normal. He has no groin pain or swelling unusual rashes PSYCH: pleasant and cooperative, no obvious depression or anxiety  ASSESSMENT AND PLAN:  Discussed the following assessment and plan:  Hip pain, right  Right buttock pain - Presumed to be hip bursitis with minimal to no response to previous treatment. Options discussed samples Celebrex #9 one a day Contact or so office for  earlier   Need for prophylactic vaccination and inoculation against influenza - Plan: Flu Vaccine QUAD 36+ mos PF IM (Fluarix) Risk benefit of medication discussed of Celebrex. This would be short-term anyway. Orthopedics officer going to call the patient and wife about getting him in sooner. -Patient advised to return or notify health care team  if symptoms worsen or persist or new concerns arise.  Patient Instructions  Can try adding celebrex oince a day woth caution at this time  This is an antiinflammatory   . uncertain  If pt other rx would be helpful  Will try to arrange earlier appt with ortho.    [name]. Panosh M.D.

## 2013-02-10 DIAGNOSIS — M76899 Other specified enthesopathies of unspecified lower limb, excluding foot: Secondary | ICD-10-CM | POA: Diagnosis not present

## 2013-02-17 DIAGNOSIS — R262 Difficulty in walking, not elsewhere classified: Secondary | ICD-10-CM | POA: Diagnosis not present

## 2013-02-18 DIAGNOSIS — R262 Difficulty in walking, not elsewhere classified: Secondary | ICD-10-CM | POA: Diagnosis not present

## 2013-02-22 DIAGNOSIS — R262 Difficulty in walking, not elsewhere classified: Secondary | ICD-10-CM | POA: Diagnosis not present

## 2013-02-24 DIAGNOSIS — R262 Difficulty in walking, not elsewhere classified: Secondary | ICD-10-CM | POA: Diagnosis not present

## 2013-02-25 DIAGNOSIS — R262 Difficulty in walking, not elsewhere classified: Secondary | ICD-10-CM | POA: Diagnosis not present

## 2013-03-04 DIAGNOSIS — R262 Difficulty in walking, not elsewhere classified: Secondary | ICD-10-CM | POA: Diagnosis not present

## 2013-03-07 ENCOUNTER — Ambulatory Visit (INDEPENDENT_AMBULATORY_CARE_PROVIDER_SITE_OTHER): Payer: Medicare Other | Admitting: Internal Medicine

## 2013-03-07 ENCOUNTER — Encounter: Payer: Self-pay | Admitting: Internal Medicine

## 2013-03-07 VITALS — BP 148/88 | HR 60 | Temp 97.9°F | Ht 72.5 in | Wt 201.0 lb

## 2013-03-07 DIAGNOSIS — Z0000 Encounter for general adult medical examination without abnormal findings: Secondary | ICD-10-CM

## 2013-03-07 LAB — HEPATIC FUNCTION PANEL
ALT: 23 U/L (ref 0–53)
AST: 33 U/L (ref 0–37)
Albumin: 4.1 g/dL (ref 3.5–5.2)
Alkaline Phosphatase: 43 U/L (ref 39–117)
Bilirubin, Direct: 0.1 mg/dL (ref 0.0–0.3)
Total Bilirubin: 0.7 mg/dL (ref 0.3–1.2)

## 2013-03-07 LAB — CBC WITH DIFFERENTIAL/PLATELET
Basophils Absolute: 0 10*3/uL (ref 0.0–0.1)
Basophils Relative: 0.2 % (ref 0.0–3.0)
Eosinophils Absolute: 0.3 10*3/uL (ref 0.0–0.7)
Eosinophils Relative: 4.2 % (ref 0.0–5.0)
HCT: 40.1 % (ref 39.0–52.0)
Hemoglobin: 13.6 g/dL (ref 13.0–17.0)
Lymphs Abs: 2.2 10*3/uL (ref 0.7–4.0)
MCV: 96 fl (ref 78.0–100.0)
Monocytes Absolute: 0.6 10*3/uL (ref 0.1–1.0)
Monocytes Relative: 9 % (ref 3.0–12.0)
Neutro Abs: 3.4 10*3/uL (ref 1.4–7.7)
RDW: 13.4 % (ref 11.5–14.6)
WBC: 6.4 10*3/uL (ref 4.5–10.5)

## 2013-03-07 LAB — POCT URINALYSIS DIPSTICK
Bilirubin, UA: NEGATIVE
Blood, UA: NEGATIVE
Ketones, UA: NEGATIVE
Leukocytes, UA: NEGATIVE
Protein, UA: NEGATIVE
Spec Grav, UA: 1.02
Urobilinogen, UA: 0.2
pH, UA: 7

## 2013-03-07 LAB — PSA: PSA: 0.93 ng/mL (ref 0.10–4.00)

## 2013-03-07 LAB — BASIC METABOLIC PANEL
BUN: 28 mg/dL — ABNORMAL HIGH (ref 6–23)
Chloride: 105 mEq/L (ref 96–112)
GFR: 60.17 mL/min (ref >60.00)
Glucose, Bld: 112 mg/dL — ABNORMAL HIGH (ref 70–99)
Potassium: 4.4 mEq/L (ref 3.5–5.1)
Sodium: 141 mEq/L (ref 135–145)

## 2013-03-07 LAB — IBC PANEL: Saturation Ratios: 31.6 % (ref 20.0–50.0)

## 2013-03-07 LAB — TSH: TSH: 3.39 u[IU]/mL (ref 0.35–5.50)

## 2013-03-07 NOTE — Progress Notes (Signed)
cpx  Past Medical History  Diagnosis Date  . GERD (gastroesophageal reflux disease)   . Hypertension   . Hyperlipidemia   . Colonic polyp   . Pneumonia 2002  . Muscular degeneration   . Testicular cyst     History   Social History  . Marital Status: Married    Spouse Name: N/A    Number of Children: N/A  . Years of Education: N/A   Occupational History  . Not on file.   Social History Main Topics  . Smoking status: Former Smoker -- 40 years    Types: Cigarettes    Quit date: 12/30/1990  . Smokeless tobacco: Not on file  . Alcohol Use: No  . Drug Use: No  . Sexual Activity: Not on file   Other Topics Concern  . Not on file   Social History Narrative  . No narrative on file    Past Surgical History  Procedure Laterality Date  . Knee surgery    . Lumbar laminectomy    . Transurethral resection of prostate    . Cataract extraction      Family History  Problem Relation Age of Onset  . Diabetes Mother   . Hypertension Mother   . Heart attack Father   . Heart disease Brother     Allergies  Allergen Reactions  . Celebrex [Celecoxib] Hives    Current Outpatient Prescriptions on File Prior to Visit  Medication Sig Dispense Refill  . hydrochlorothiazide (HYDRODIURIL) 25 MG tablet Take 1 tablet (25 mg total) by mouth daily.  90 tablet  3  . Multiple Vitamin (MULTIVITAMIN) tablet Take 1 tablet by mouth daily.        . Multiple Vitamins-Minerals (ICAPS) CAPS Take by mouth.      . Omega-3 Fatty Acids (FISH OIL) 1000 MG CPDR Take by mouth.      Marland Kitchen omeprazole (PRILOSEC) 20 MG capsule Take 1 capsule (20 mg total) by mouth daily.  90 capsule  3  . VITAMIN E PO Take by mouth.       No current facility-administered medications on file prior to visit.     patient denies chest pain, shortness of breath, orthopnea. Denies lower extremity edema, abdominal pain, change in appetite, change in bowel movements. Patient denies rashes, musculoskeletal complaints. No other  specific complaints in a complete review of systems.   BP 148/88  Pulse 60  Temp(Src) 97.9 F (36.6 C) (Oral)  Ht 6' 0.5" (1.842 m)  Wt 201 lb (91.173 kg)  BMI 26.87 kg/m2  well-developed well-nourished male in no acute distress. HEENT exam atraumatic, normocephalic, neck supple without jugular venous distention. Chest clear to auscultation cardiac exam S1-S2 are regular. Abdominal exam overweight with bowel sounds, soft and nontender. Extremities no edema. Neurologic exam is alert with a normal gait.   Well visit: health maint UTD

## 2013-03-08 DIAGNOSIS — R262 Difficulty in walking, not elsewhere classified: Secondary | ICD-10-CM | POA: Diagnosis not present

## 2013-03-11 DIAGNOSIS — R262 Difficulty in walking, not elsewhere classified: Secondary | ICD-10-CM | POA: Diagnosis not present

## 2013-03-14 DIAGNOSIS — R262 Difficulty in walking, not elsewhere classified: Secondary | ICD-10-CM | POA: Diagnosis not present

## 2013-05-26 ENCOUNTER — Telehealth: Payer: Self-pay | Admitting: Internal Medicine

## 2013-05-26 ENCOUNTER — Ambulatory Visit (INDEPENDENT_AMBULATORY_CARE_PROVIDER_SITE_OTHER): Payer: Medicare Other | Admitting: Family Medicine

## 2013-05-26 ENCOUNTER — Encounter: Payer: Self-pay | Admitting: Family Medicine

## 2013-05-26 ENCOUNTER — Ambulatory Visit (INDEPENDENT_AMBULATORY_CARE_PROVIDER_SITE_OTHER)
Admission: RE | Admit: 2013-05-26 | Discharge: 2013-05-26 | Disposition: A | Discharge status: Home / Self Care | Payer: Medicare Other | Source: Ambulatory Visit | Attending: Family Medicine | Admitting: Family Medicine

## 2013-05-26 VITALS — BP 155/84 | HR 90 | Temp 100.8°F | Resp 26 | Ht 72.5 in | Wt 205.0 lb

## 2013-05-26 DIAGNOSIS — R509 Fever, unspecified: Secondary | ICD-10-CM

## 2013-05-26 DIAGNOSIS — R059 Cough, unspecified: Secondary | ICD-10-CM

## 2013-05-26 DIAGNOSIS — J111 Influenza due to unidentified influenza virus with other respiratory manifestations: Secondary | ICD-10-CM

## 2013-05-26 DIAGNOSIS — R55 Syncope and collapse: Secondary | ICD-10-CM

## 2013-05-26 DIAGNOSIS — R69 Illness, unspecified: Principal | ICD-10-CM

## 2013-05-26 DIAGNOSIS — R05 Cough: Secondary | ICD-10-CM

## 2013-05-26 MED ORDER — OSELTAMIVIR PHOSPHATE 75 MG PO CAPS
75.0000 mg | ORAL_CAPSULE | Freq: Two times a day (BID) | ORAL | Status: DC
Start: 2013-05-26 — End: 2014-05-29

## 2013-05-26 MED ORDER — HYDROCODONE-HOMATROPINE 5-1.5 MG/5ML PO SYRP: ORAL_SOLUTION | ORAL | Status: DC

## 2013-05-26 NOTE — Progress Notes (Addendum)
OFFICE NOTE  05/26/2013  CC:  Chief Complaint  Patient presents with  . Cough    x Monday  . Fever     HPI: Patient is a 79 y.o. Caucasian male who is here for cough. Onset 4d/a ST, nasal congest, SOB/wheezy, achy all over.  No n/v/d. Hurts bad in chest to cough.  No n/v/d.   He had some presyncope last night in middle of night when he got up to get water/cough drop--actually passed out briefly, no injury. EMS was called and per pt report VS normal.  EKG tracing brought with him today shows nothing acute. He did get flu vaccine this year.  Pertinent PMH:  Past Medical History  Diagnosis Date  . GERD (gastroesophageal reflux disease)   . Hypertension   . Hyperlipidemia   . Colonic polyp   . Pneumonia 2002  . Muscular degeneration   . Testicular cyst     MEDS:  Outpatient Prescriptions Prior to Visit  Medication Sig Dispense Refill  . aspirin 81 MG tablet Take 81 mg by mouth daily.      . hydrochlorothiazide (HYDRODIURIL) 25 MG tablet Take 1 tablet (25 mg total) by mouth daily.  90 tablet  3  . Multiple Vitamin (MULTIVITAMIN) tablet Take 1 tablet by mouth daily.        . Multiple Vitamins-Minerals (ICAPS) CAPS Take by mouth.      . Omega-3 Fatty Acids (FISH OIL) 1000 MG CPDR Take by mouth.      Marland Kitchen omeprazole (PRILOSEC) 20 MG capsule Take 1 capsule (20 mg total) by mouth daily.  90 capsule  3  . VITAMIN E PO Take by mouth.       No facility-administered medications prior to visit.    PE: Blood pressure 155/84, pulse 90, temperature 100.8 F (38.2 C), temperature source Temporal, resp. rate 26, height 6' 0.5" (1.842 m), weight 205 lb (92.987 kg), SpO2 100.00%. Gen: alert, shivering but in no resp distress.  He attends well, is oriented x 4. AFFECT: pleasant, lucid thought and speech. HEENT: eyes without injection, drainage, or swelling.  Ears: EACs clear, TMs with normal light reflex and landmarks.  Nose: Clear rhinorrhea, with some dried, crusty exudate adherent to  mildly injected mucosa.  No purulent d/c.  No paranasal sinus TTP.  No facial swelling.  Throat and mouth without focal lesion.  No pharyngial swelling, erythema, or exudate.   Neck: supple, no LAD.   LUNGS: CTA bilat, nonlabored resps.  Some pseudowheezing heard.  RR in the 40s, no signs of accessory muscle use. CV: Regular, tachy to 100-110, no m/r/g. EXT: no c/c/e SKIN: no rash   IMPRESSION AND PLAN:  Influenza-like illness With recent orthostatic vs cough-related syncope. He is shivering in office currently but VS stable. I am going to send him for a CXR to r/o infiltrate since he is 4 days into this. I will go ahead and start tamiflu b/c I consider him a high risk pt: 75mg  bid x 5d. Mucinex DM +/- hycodan (for aches/cough). Tylenol 1000 mg q6h fever/aches.  Encouraged hydration with clear liquids. Signs/symptoms to call or return for were reviewed and pt expressed understanding.    An After Visit Summary was printed and given to the patient.  FOLLOW UP: prn   ADDENDUM 05/26/13 at 2:30 pm:   CXR today was normal.  Pt's wife notified.  No new orders.

## 2013-05-26 NOTE — Assessment & Plan Note (Signed)
With recent orthostatic vs cough-related syncope. He is shivering in office currently but VS stable. I am going to send him for a CXR to r/o infiltrate since he is 4 days into this. I will go ahead and start tamiflu b/c I consider him a high risk pt: 75mg  bid x 5d. Mucinex DM +/- hycodan (for aches/cough). Tylenol 1000 mg q6h fever/aches.  Encouraged hydration with clear liquids. Signs/symptoms to call or return for were reviewed and pt expressed understanding.

## 2013-05-26 NOTE — Telephone Encounter (Signed)
Patient Information:  Caller Name: Tim Ross  Phone: [phone]  Patient: Tim Ross, Tim Ross  Gender: Male  DOB: [birth year]  Age: 79 Years  PCP: Phoebe Sharps (Adults only)  Office Follow Up:  Does the office need to follow up with this patient?: Yes  Instructions For The Office: Please advise patient if they need to go to ED or schedule appointment in office.   Symptoms  Reason For Call & Symptoms: Edith/ spouse calling.  States he fainted at 01:30 when he got up to get something from the kitchen.  . Cough with pain in ribs, chills  and sore throat onset 05/23/13.  Go to ED Now or to Office with PCP Approval per Fainting guideline.  due to Age > 50 years.  Frequent cough heard during the call.  Reviewed Health History In EMR: Yes  Reviewed Medications In EMR: Yes  Reviewed Allergies In EMR: Yes  Reviewed Surgeries / Procedures: Yes  Date of Onset of Symptoms: 05/26/2013  Treatments Tried: EMS came; advised to follow up with PCP on 05/26/13.  Tylenol for fever, throat lozenges.  Treatments Tried Worked: No  Any Fever: Yes  Fever Taken: Oral  Fever Time Of Reading: 08:15:00  Fever Last Reading: 101.9  Guideline(s) Used:  Fainting  Disposition Per Guideline:   Go to ED Now (or to Office with PCP Approval)  Reason For Disposition Reached:   Age > 50 years  Advice Given:  Treatment:  Drink some fruit juice, especially if you have missed a meal or have not eaten in over 6 hours.  Warning Symptoms for Fainting:  Fainting usually has early warning symptoms (e.g., dizziness, blurred vision, nausea, feeling cold, or warm).  If you feel these warning symptoms, immediately lie down to prevent falling down. You only have 5 seconds to act (Reason: almost impossible to faint when lying down).  Lying down (even on the floor) is less embarrassing than fainting, no matter where you are.  Sitting down (with head between knees) is certainly better than staying standing up but is less effective than  lying down.  Call Back If:  You pass out again on the same day  You become worse.  RN Overrode Recommendation:  Document Patient  Please advise patient if they need to go to ED or schedule appointment in office.

## 2013-05-26 NOTE — Patient Instructions (Signed)
Take mucinex DM every 12h for cough/congestion

## 2013-05-26 NOTE — Progress Notes (Signed)
Pre visit review using our clinic review tool, if applicable. No additional management support is needed unless otherwise documented below in the visit note. 

## 2013-05-26 NOTE — Telephone Encounter (Signed)
appt scheduled with Dr Anitra Lauth at [facility]

## 2013-06-08 ENCOUNTER — Encounter: Payer: Self-pay | Admitting: Family Medicine

## 2013-06-08 ENCOUNTER — Ambulatory Visit (INDEPENDENT_AMBULATORY_CARE_PROVIDER_SITE_OTHER): Payer: Medicare Other | Admitting: Family Medicine

## 2013-06-08 VITALS — BP 154/82 | HR 85 | Temp 98.6°F | Ht 72.5 in | Wt 200.0 lb

## 2013-06-08 DIAGNOSIS — J019 Acute sinusitis, unspecified: Secondary | ICD-10-CM

## 2013-06-08 DIAGNOSIS — M546 Pain in thoracic spine: Secondary | ICD-10-CM | POA: Diagnosis not present

## 2013-06-08 MED ORDER — AMOXICILLIN-POT CLAVULANATE 875-125 MG PO TABS: 1.0000 | ORAL_TABLET | Freq: Two times a day (BID) | ORAL | Status: DC

## 2013-06-08 NOTE — Progress Notes (Signed)
   Subjective:    Patient ID: Tim Ross, male    DOB: 1934/07/26, 78 y.o.   MRN: [id number]  HPI Here for sinus congestion and for a pain in the right upper back beneath the shoulder blade. He became ill 2 weeks ago with what sounds like the flu, and he had aches, fevers, and a cough. The aches and fevers resolved but the cough has continued. It is non-productive. He developed the back pain when the coughing started. Heat and Advil help. Now he has sinus pressure and PND.    Review of Systems  Constitutional: Negative.   HENT: Positive for congestion, postnasal drip and sinus pressure.   Eyes: Negative.   Respiratory: Positive for cough.        Objective:   Physical Exam  Constitutional: He appears well-developed and well-nourished. No distress.  HENT:  Right Ear: External ear normal.  Left Ear: External ear normal.  Nose: Nose normal.  Mouth/Throat: Oropharynx is clear and moist.  Eyes: Conjunctivae are normal.  Pulmonary/Chest: Effort normal and breath sounds normal.  Musculoskeletal:  Tender in the right upper back between the spine and the scapula   Lymphadenopathy:    He has no cervical adenopathy.          Assessment & Plan:  He seems to have gotten over influenza but he has developed a sinusitis. Also he has a muscle strain in the back from coughing. Given Augmentin and he will use Advil for the back pain.

## 2013-06-08 NOTE — Progress Notes (Signed)
Pre visit review using our clinic review tool, if applicable. No additional management support is needed unless otherwise documented below in the visit note. 

## 2013-08-08 ENCOUNTER — Ambulatory Visit (INDEPENDENT_AMBULATORY_CARE_PROVIDER_SITE_OTHER): Payer: Medicare Other | Admitting: Family Medicine

## 2013-08-08 ENCOUNTER — Encounter: Payer: Self-pay | Admitting: Family Medicine

## 2013-08-08 VITALS — BP 132/78 | Temp 97.5°F | Wt 205.0 lb

## 2013-08-08 DIAGNOSIS — M79609 Pain in unspecified limb: Secondary | ICD-10-CM | POA: Diagnosis not present

## 2013-08-08 DIAGNOSIS — M79643 Pain in unspecified hand: Secondary | ICD-10-CM

## 2013-08-08 MED ORDER — DOXYCYCLINE HYCLATE 100 MG PO TABS: 100.0000 mg | ORAL_TABLET | Freq: Two times a day (BID) | ORAL | Status: DC

## 2013-08-08 NOTE — Progress Notes (Signed)
Chief Complaint  Patient presents with  . swollen hand    HPI:  Acute visit for hand pain: -after working working in yard 1 week ago -thinks got bit by something and strained hand cutting artificial flowers -had swelling and pain in this area the next day, now improving -denies: fevers, chills, malaise ROS: See pertinent positives and negatives per HPI.  Past Medical History  Diagnosis Date  . GERD (gastroesophageal reflux disease)   . Hypertension   . Hyperlipidemia   . Colonic polyp   . Pneumonia 2002  . Muscular degeneration   . Testicular cyst     Past Surgical History  Procedure Laterality Date  . Knee surgery    . Lumbar laminectomy    . Transurethral resection of prostate    . Cataract extraction      Family History  Problem Relation Age of Onset  . Diabetes Mother   . Hypertension Mother   . Heart attack Father   . Heart disease Brother     History   Social History  . Marital Status: Married    Spouse Name: N/A    Number of Children: N/A  . Years of Education: N/A   Social History Main Topics  . Smoking status: Former Smoker -- 40 years    Types: Cigarettes    Quit date: 12/30/1990  . Smokeless tobacco: Never Used  . Alcohol Use: No  . Drug Use: No  . Sexual Activity: None   Other Topics Concern  . None   Social History Narrative  . None    Current outpatient prescriptions:amoxicillin-clavulanate (AUGMENTIN) 875-125 MG per tablet, Take 1 tablet by mouth 2 (two) times daily., Disp: 20 tablet, Rfl: 0;  aspirin 81 MG tablet, Take 81 mg by mouth daily., Disp: , Rfl: ;  doxycycline (VIBRA-TABS) 100 MG tablet, Take 1 tablet (100 mg total) by mouth 2 (two) times daily., Disp: 20 tablet, Rfl: 0 hydrochlorothiazide (HYDRODIURIL) 25 MG tablet, Take 1 tablet (25 mg total) by mouth daily., Disp: 90 tablet, Rfl: 3;  HYDROcodone-homatropine (HYCODAN) 5-1.5 MG/5ML syrup, 1-2 tsp po q6h prn cough/aches, Disp: 240 mL, Rfl: 0;  Multiple Vitamin (MULTIVITAMIN)  tablet, Take 1 tablet by mouth daily.  , Disp: , Rfl: ;  Multiple Vitamins-Minerals (ICAPS) CAPS, Take by mouth., Disp: , Rfl:  Omega-3 Fatty Acids (FISH OIL) 1000 MG CPDR, Take by mouth., Disp: , Rfl: ;  omeprazole (PRILOSEC) 20 MG capsule, Take 1 capsule (20 mg total) by mouth daily., Disp: 90 capsule, Rfl: 3;  oseltamivir (TAMIFLU) 75 MG capsule, Take 1 capsule (75 mg total) by mouth 2 (two) times daily., Disp: 10 capsule, Rfl: 0;  VITAMIN E PO, Take by mouth., Disp: , Rfl:   EXAM:  Filed Vitals:   08/08/13 1049  BP: 132/78  Temp: 97.5 F (36.4 C)    Body mass index is 27.41 kg/(m^2).  GENERAL: vitals reviewed and listed above, alert, oriented, appears well hydrated and in no acute distress  HEENT: atraumatic, conjunttiva clear, no obvious abnormalities on inspection of external nose and ears  NECK: no obvious masses on inspection  LUNGS: clear to auscultation bilaterally, no wheezes, rales or rhonchi, good air movement  CV: HRRR, no peripheral edema  MS: moves all extremities without noticeable abnormality  SKIN: mild erythema and swelling and TTP R dorsal hand   PSYCH: pleasant and cooperative, no obvious depression or anxiety  ASSESSMENT AND PLAN:  Discussed the following assessment and plan:  Hand pain - Plan: doxycycline (VIBRA-TABS)  100 MG tablet, DG Hand Complete Right  -looks more like skin inflammation - query insect bite with possible inflammation versus mild cellulitis  -but pt also wrestled with clippers to cut artificial flowers so will obtain plain film to exclude fx -discussed other potential etiologies such as gout, joint infection, etc though less likely (as dorsal hand seems primarily involved) and workup for these - he prefers to do trial of doxy, tylenol (can not do nsaids) and plain film  -advised to go to see a doctor immediately if sig progressive swelling, redness, systemic symptoms -Patient advised to return or notify a doctor immediately if  symptoms worsen or persist or new concerns arise.  Patient Instructions  -go get xray today and start medication  -if worsening or spreading redness, pain or other symptoms see a doctor immediately       Clora Ohmer, Jarrett Soho R.

## 2013-08-08 NOTE — Patient Instructions (Addendum)
-  go get xray today and start medication  -if worsening or spreading redness, pain or other symptoms see a doctor immediately  -follow up in 2 days if the same

## 2013-08-08 NOTE — Progress Notes (Signed)
Pre visit review using our clinic review tool, if applicable. No additional management support is needed unless otherwise documented below in the visit note. 

## 2013-08-09 ENCOUNTER — Ambulatory Visit (INDEPENDENT_AMBULATORY_CARE_PROVIDER_SITE_OTHER)
Admission: RE | Admit: 2013-08-09 | Discharge: 2013-08-09 | Disposition: A | Discharge status: Home / Self Care | Payer: Medicare Other | Source: Ambulatory Visit | Attending: Family Medicine | Admitting: Family Medicine

## 2013-08-09 DIAGNOSIS — M79609 Pain in unspecified limb: Secondary | ICD-10-CM

## 2013-08-09 DIAGNOSIS — M7989 Other specified soft tissue disorders: Secondary | ICD-10-CM | POA: Diagnosis not present

## 2013-08-09 DIAGNOSIS — M79643 Pain in unspecified hand: Secondary | ICD-10-CM

## 2013-09-28 DIAGNOSIS — H02839 Dermatochalasis of unspecified eye, unspecified eyelid: Secondary | ICD-10-CM | POA: Diagnosis not present

## 2013-09-28 DIAGNOSIS — H2510 Age-related nuclear cataract, unspecified eye: Secondary | ICD-10-CM | POA: Diagnosis not present

## 2013-09-28 DIAGNOSIS — H5200 Hypermetropia, unspecified eye: Secondary | ICD-10-CM | POA: Diagnosis not present

## 2013-09-28 DIAGNOSIS — Z961 Presence of intraocular lens: Secondary | ICD-10-CM | POA: Diagnosis not present

## 2013-09-28 DIAGNOSIS — H40019 Open angle with borderline findings, low risk, unspecified eye: Secondary | ICD-10-CM | POA: Diagnosis not present

## 2013-09-28 DIAGNOSIS — H353190 Nonexudative age-related macular degeneration, unspecified eye, stage unspecified: Secondary | ICD-10-CM | POA: Diagnosis not present

## 2013-10-03 ENCOUNTER — Encounter (INDEPENDENT_AMBULATORY_CARE_PROVIDER_SITE_OTHER): Payer: Medicare Other | Admitting: Ophthalmology

## 2013-10-03 DIAGNOSIS — H3530 Unspecified macular degeneration: Secondary | ICD-10-CM | POA: Diagnosis not present

## 2013-10-03 DIAGNOSIS — H353290 Exudative age-related macular degeneration, unspecified eye, stage unspecified: Secondary | ICD-10-CM | POA: Diagnosis not present

## 2013-10-03 DIAGNOSIS — I10 Essential (primary) hypertension: Secondary | ICD-10-CM

## 2013-10-03 DIAGNOSIS — H43819 Vitreous degeneration, unspecified eye: Secondary | ICD-10-CM

## 2013-10-03 DIAGNOSIS — H35039 Hypertensive retinopathy, unspecified eye: Secondary | ICD-10-CM | POA: Diagnosis not present

## 2013-10-03 DIAGNOSIS — H2510 Age-related nuclear cataract, unspecified eye: Secondary | ICD-10-CM

## 2013-11-01 ENCOUNTER — Encounter (INDEPENDENT_AMBULATORY_CARE_PROVIDER_SITE_OTHER): Payer: Medicare Other | Admitting: Ophthalmology

## 2013-11-01 DIAGNOSIS — H2510 Age-related nuclear cataract, unspecified eye: Secondary | ICD-10-CM

## 2013-11-01 DIAGNOSIS — H35039 Hypertensive retinopathy, unspecified eye: Secondary | ICD-10-CM | POA: Diagnosis not present

## 2013-11-01 DIAGNOSIS — H43819 Vitreous degeneration, unspecified eye: Secondary | ICD-10-CM

## 2013-11-01 DIAGNOSIS — H3530 Unspecified macular degeneration: Secondary | ICD-10-CM | POA: Diagnosis not present

## 2013-11-01 DIAGNOSIS — H353290 Exudative age-related macular degeneration, unspecified eye, stage unspecified: Secondary | ICD-10-CM

## 2013-11-01 DIAGNOSIS — I10 Essential (primary) hypertension: Secondary | ICD-10-CM | POA: Diagnosis not present

## 2013-11-24 ENCOUNTER — Other Ambulatory Visit: Payer: Self-pay | Admitting: Physician Assistant

## 2013-11-24 ENCOUNTER — Encounter: Payer: Self-pay | Admitting: Physician Assistant

## 2013-11-24 ENCOUNTER — Ambulatory Visit (INDEPENDENT_AMBULATORY_CARE_PROVIDER_SITE_OTHER): Payer: Medicare Other | Admitting: Physician Assistant

## 2013-11-24 VITALS — BP 138/72 | HR 72 | Temp 97.7°F | Resp 18 | Wt 200.0 lb

## 2013-11-24 DIAGNOSIS — R109 Unspecified abdominal pain: Secondary | ICD-10-CM

## 2013-11-24 DIAGNOSIS — B351 Tinea unguium: Secondary | ICD-10-CM

## 2013-11-24 DIAGNOSIS — M545 Low back pain, unspecified: Secondary | ICD-10-CM | POA: Diagnosis not present

## 2013-11-24 LAB — POCT URINALYSIS DIPSTICK
BILIRUBIN UA: NEGATIVE
Glucose, UA: NEGATIVE
Ketones, UA: NEGATIVE
LEUKOCYTES UA: NEGATIVE
Nitrite, UA: NEGATIVE
PH UA: 5.5
Protein, UA: NEGATIVE
Spec Grav, UA: <=1.005
Urobilinogen, UA: 0.2

## 2013-11-24 MED ORDER — IBUPROFEN 600 MG PO TABS: 600.0000 mg | ORAL_TABLET | Freq: Three times a day (TID) | ORAL | Status: DC | PRN

## 2013-11-24 NOTE — Patient Instructions (Signed)
Ibuprofen 600 mg every 8 hours as needed. Do not mix with home Advil or Aleve (naproxen or ibuprofen).  Use heat on the area to relieve pain, and rest your back, avoid twisting and bending, as well as other motions that tend to cause pain.  For the nail fungus, you can use warm water soaks, apple cider vinegar soaks, Vicks vapor rub, which are all home remedies that seemed to offer some benefit over the long-term. If you feel as though your condition does not improve or worsens, call and schedule an appointment to see a podiatrist for evaluation.  If emergency symptoms discussed during visit developed, seek medical attention immediately.  Followup as needed, or for worsening or persistent symptoms despite treatment.   Back Pain, Adult Back pain is very common. The pain often gets better over time. The cause of back pain is usually not dangerous. Most people can learn to manage their back pain on their own.  HOME CARE   Stay active. Start with short walks on flat ground if you can. Try to walk farther each day.  Do not sit, drive, or stand in one place for more than 30 minutes. Do not stay in bed.  Do not avoid exercise or work. Activity can help your back heal faster.  Be careful when you bend or lift an object. Bend at your knees, keep the object close to you, and do not twist.  Sleep on a firm mattress. Lie on your side, and bend your knees. If you lie on your back, put a pillow under your knees.  Only take medicines as told by your doctor.  Put ice on the injured area.  Put ice in a plastic bag.  Place a towel between your skin and the bag.  Leave the ice on for 15-20 minutes, 03-04 times a day for the first 2 to 3 days. After that, you can switch between ice and heat packs.  Ask your doctor about back exercises or massage.  Avoid feeling anxious or stressed. Find good ways to deal with stress, such as exercise. GET HELP RIGHT AWAY IF:   Your pain does not go away with rest  or medicine.  Your pain does not go away in 1 week.  You have new problems.  You do not feel well.  The pain spreads into your legs.  You cannot control when you poop (bowel movement) or pee (urinate).  Your arms or legs feel weak or lose feeling (numbness).  You feel sick to your stomach (nauseous) or throw up (vomit).  You have belly (abdominal) pain.  You feel like you may pass out (faint). MAKE SURE YOU:   Understand these instructions.  Will watch your condition.  Will get help right away if you are not doing well or get worse. Document Released: 10/22/2007 Document Revised: 07/28/2011 Document Reviewed: 09/23/2010 [employer] Patient Information 2015 [place], [place]. This information is not intended to replace advice given to you by your health care provider. Make sure you discuss any questions you have with your health care provider.

## 2013-11-24 NOTE — Progress Notes (Addendum)
Subjective:    Patient ID: Tim Ross, male    DOB: [birth year], 79 y.o.   MRN: [id number]  Back Pain This is a new problem. The current episode started 1 to 4 weeks ago. The problem occurs constantly. The problem has been waxing and waning since onset. The pain is present in the lumbar spine, gluteal and sacro-iliac. The quality of the pain is described as burning. The pain does not radiate. The pain is at a severity of 5/10. The pain is moderate. The pain is the same all the time. The symptoms are aggravated by bending (when standing from sitting.). Associated symptoms include weight loss. Pertinent negatives include no abdominal pain, bladder incontinence, bowel incontinence, chest pain, dysuria, fever, headaches, leg pain, numbness, paresis, paresthesias, pelvic pain, perianal numbness, tingling or weakness. He has tried NSAIDs (muscle cream) for the symptoms. The treatment provided mild relief.  Pt has been seen in the past by orthopedics for right hip bursitis, which resolved with steroid injections. Had back surgery over a decade ago.  No history of kidney stone/infection.   Review of Systems  Constitutional: Positive for weight loss. Negative for fever and chills.  Respiratory: Negative for shortness of breath.   Cardiovascular: Negative for chest pain.  Gastrointestinal: Negative for nausea, vomiting, abdominal pain, diarrhea and bowel incontinence.  Genitourinary: Negative for bladder incontinence, dysuria, urgency, frequency, hematuria, flank pain and pelvic pain.  Musculoskeletal: Positive for back pain.  Neurological: Negative for tingling, weakness, numbness, headaches and paresthesias.  All other systems reviewed and are negative.     Past Medical History  Diagnosis Date  . GERD (gastroesophageal reflux disease)   . Hypertension   . Hyperlipidemia   . Colonic polyp   . Pneumonia 2002  . Muscular degeneration   . Testicular cyst     History   Social History    . Marital Status: Married    Spouse Name: N/A    Number of Children: N/A  . Years of Education: N/A   Occupational History  . Not on file.   Social History Main Topics  . Smoking status: Former Smoker -- 40 years    Types: Cigarettes    Quit date: 12/30/1990  . Smokeless tobacco: Never Used  . Alcohol Use: No  . Drug Use: No  . Sexual Activity: Not on file   Other Topics Concern  . Not on file   Social History Narrative  . No narrative on file    Past Surgical History  Procedure Laterality Date  . Knee surgery    . Lumbar laminectomy    . Transurethral resection of prostate    . Cataract extraction      Family History  Problem Relation Age of Onset  . Diabetes Mother   . Hypertension Mother   . Heart attack Father   . Heart disease Brother     Allergies  Allergen Reactions  . Celebrex [Celecoxib] Hives    Current Outpatient Prescriptions on File Prior to Visit  Medication Sig Dispense Refill  . aspirin 81 MG tablet Take 81 mg by mouth daily.      . hydrochlorothiazide (HYDRODIURIL) 25 MG tablet Take 1 tablet (25 mg total) by mouth daily.  90 tablet  3  . HYDROcodone-homatropine (HYCODAN) 5-1.5 MG/5ML syrup 1-2 tsp po q6h prn cough/aches  240 mL  0  . Multiple Vitamin (MULTIVITAMIN) tablet Take 1 tablet by mouth daily.        . Multiple Vitamins-Minerals (ICAPS) CAPS Take  by mouth.      . Omega-3 Fatty Acids (FISH OIL) 1000 MG CPDR Take by mouth.      Marland Kitchen omeprazole (PRILOSEC) 20 MG capsule Take 1 capsule (20 mg total) by mouth daily.  90 capsule  3  . oseltamivir (TAMIFLU) 75 MG capsule Take 1 capsule (75 mg total) by mouth 2 (two) times daily.  10 capsule  0  . VITAMIN E PO Take by mouth.       No current facility-administered medications on file prior to visit.    EXAM: BP 138/72  Pulse 72  Temp(Src) 97.7 F (36.5 C) (Oral)  Resp 18  Wt 200 lb (90.719 kg)     Objective:   Physical Exam  Nursing note and vitals reviewed. Constitutional: He is  oriented to person, place, and time. He appears well-developed and well-nourished. No distress.  HENT:  Head: Normocephalic and atraumatic.  Eyes: Conjunctivae and EOM are normal. Pupils are equal, round, and reactive to light.  Neck: Normal range of motion.  Cardiovascular: Normal rate, regular rhythm, normal heart sounds and intact distal pulses.   Pulmonary/Chest: Effort normal and breath sounds normal. No respiratory distress. He exhibits no tenderness.  Musculoskeletal: Normal range of motion. He exhibits tenderness (mild TTP left lumbar paraspinal musculature. No bony tenderness.). He exhibits no edema.  Neurological: He is alert and oriented to person, place, and time. He has normal reflexes. He displays normal reflexes. He exhibits normal muscle tone. Coordination normal.  Negative SLR. No GAIT abnormality.  Skin: Skin is warm and dry. No rash noted. He is not diaphoretic. No erythema. No pallor.  Right 1st toe has some yellowing color changes of the nail.  Psychiatric: He has a normal mood and affect. His behavior is normal. Judgment and thought content normal.     Lab Results  Component Value Date   WBC 6.4 03/07/2013   HGB 13.6 03/07/2013   HCT 40.1 03/07/2013   PLT 195.0 03/07/2013   GLUCOSE 112* 03/07/2013   CHOL 165 12/31/2011   TRIG 137.0 12/31/2011   HDL 40.20 12/31/2011   LDLDIRECT 93.8 12/24/2009   LDLCALC 97 12/31/2011   ALT 23 03/07/2013   AST 33 03/07/2013   NA 141 03/07/2013   K 4.4 03/07/2013   CL 105 03/07/2013   CREATININE 1.2 03/07/2013   BUN 28* 03/07/2013   CO2 28 03/07/2013   TSH 3.39 03/07/2013   PSA 0.93 03/07/2013        Assessment & Plan:  Dannis was seen today for left side pain.  Diagnoses and associated orders for this visit:  Flank pain Comments: UA with lysed blood cells. Otherwise negative. No urinary symptoms, no CVA tenderness. - POCT urinalysis dipstick  Acute low back pain Comments: Several weeks, mild to moderate symptoms.  will Rx anti-inflamatory.  - ibuprofen (ADVIL,MOTRIN) 600 MG tablet; Take 1 tablet (600 mg total) by mouth every 8 (eight) hours as needed.  Toenail fungus Comments: Recomended at home OTC care, foot soaks, apple cider vinegar, vicks vapor rub on nail. If persists or worsens, pt will schedule appt with podiatry.    Pt will also consider seeing ortho if his back pain does not improve, as he has prior relationship there.  Pt has allergy to Celebrex, however takes advil/ibuprofen fine, will use Rx strength advil.  Return precautions provided, and patient handout on back pain.  Plan to follow up as needed, or for worsening or persistent symptoms despite treatment.  Patient Instructions  Ibuprofen 600  mg every 8 hours as needed. Do not mix with home Advil or Aleve (naproxen or ibuprofen).  Use heat on the area to relieve pain, and rest your back, avoid twisting and bending, as well as other motions that tend to cause pain.  For the nail fungus, you can use warm water soaks, apple cider vinegar soaks, Vicks vapor rub, which are all home remedies that seemed to offer some benefit over the long-term. If you feel as though your condition does not improve or worsens, call and schedule an appointment to see a podiatrist for evaluation.  If emergency symptoms discussed during visit developed, seek medical attention immediately.  Followup as needed, or for worsening or persistent symptoms despite treatment.

## 2013-11-24 NOTE — Progress Notes (Signed)
Pre visit review using our clinic review tool, if applicable. No additional management support is needed unless otherwise documented below in the visit note. 

## 2013-11-29 ENCOUNTER — Encounter (INDEPENDENT_AMBULATORY_CARE_PROVIDER_SITE_OTHER): Payer: Medicare Other | Admitting: Ophthalmology

## 2013-11-29 DIAGNOSIS — H353290 Exudative age-related macular degeneration, unspecified eye, stage unspecified: Secondary | ICD-10-CM

## 2013-11-29 DIAGNOSIS — H43819 Vitreous degeneration, unspecified eye: Secondary | ICD-10-CM

## 2013-11-29 DIAGNOSIS — H35039 Hypertensive retinopathy, unspecified eye: Secondary | ICD-10-CM | POA: Diagnosis not present

## 2013-11-29 DIAGNOSIS — I10 Essential (primary) hypertension: Secondary | ICD-10-CM | POA: Diagnosis not present

## 2013-11-29 DIAGNOSIS — H3530 Unspecified macular degeneration: Secondary | ICD-10-CM

## 2013-11-29 DIAGNOSIS — H2510 Age-related nuclear cataract, unspecified eye: Secondary | ICD-10-CM

## 2014-01-10 ENCOUNTER — Encounter (INDEPENDENT_AMBULATORY_CARE_PROVIDER_SITE_OTHER): Payer: Medicare Other | Admitting: Ophthalmology

## 2014-01-12 ENCOUNTER — Encounter (INDEPENDENT_AMBULATORY_CARE_PROVIDER_SITE_OTHER): Payer: Medicare Other | Admitting: Ophthalmology

## 2014-01-12 DIAGNOSIS — I10 Essential (primary) hypertension: Secondary | ICD-10-CM | POA: Diagnosis not present

## 2014-01-12 DIAGNOSIS — H43819 Vitreous degeneration, unspecified eye: Secondary | ICD-10-CM

## 2014-01-12 DIAGNOSIS — H35039 Hypertensive retinopathy, unspecified eye: Secondary | ICD-10-CM

## 2014-01-12 DIAGNOSIS — H353290 Exudative age-related macular degeneration, unspecified eye, stage unspecified: Secondary | ICD-10-CM

## 2014-01-12 DIAGNOSIS — H3530 Unspecified macular degeneration: Secondary | ICD-10-CM | POA: Diagnosis not present

## 2014-02-01 ENCOUNTER — Ambulatory Visit (INDEPENDENT_AMBULATORY_CARE_PROVIDER_SITE_OTHER): Payer: Medicare Other | Admitting: Ophthalmology

## 2014-02-20 DIAGNOSIS — Z23 Encounter for immunization: Secondary | ICD-10-CM | POA: Diagnosis not present

## 2014-03-02 ENCOUNTER — Encounter: Payer: Self-pay | Admitting: Internal Medicine

## 2014-03-09 ENCOUNTER — Encounter (INDEPENDENT_AMBULATORY_CARE_PROVIDER_SITE_OTHER): Payer: Medicare Other | Admitting: Ophthalmology

## 2014-03-09 DIAGNOSIS — H3531 Nonexudative age-related macular degeneration: Secondary | ICD-10-CM

## 2014-03-09 DIAGNOSIS — H35033 Hypertensive retinopathy, bilateral: Secondary | ICD-10-CM | POA: Diagnosis not present

## 2014-03-09 DIAGNOSIS — H3532 Exudative age-related macular degeneration: Secondary | ICD-10-CM

## 2014-03-09 DIAGNOSIS — H43813 Vitreous degeneration, bilateral: Secondary | ICD-10-CM

## 2014-03-09 DIAGNOSIS — I10 Essential (primary) hypertension: Secondary | ICD-10-CM

## 2014-05-04 ENCOUNTER — Encounter (INDEPENDENT_AMBULATORY_CARE_PROVIDER_SITE_OTHER): Payer: Medicare Other | Admitting: Ophthalmology

## 2014-05-04 DIAGNOSIS — H3532 Exudative age-related macular degeneration: Secondary | ICD-10-CM | POA: Diagnosis not present

## 2014-05-04 DIAGNOSIS — H43813 Vitreous degeneration, bilateral: Secondary | ICD-10-CM | POA: Diagnosis not present

## 2014-05-04 DIAGNOSIS — H2511 Age-related nuclear cataract, right eye: Secondary | ICD-10-CM

## 2014-05-04 DIAGNOSIS — I10 Essential (primary) hypertension: Secondary | ICD-10-CM

## 2014-05-04 DIAGNOSIS — H35033 Hypertensive retinopathy, bilateral: Secondary | ICD-10-CM

## 2014-05-04 DIAGNOSIS — H3531 Nonexudative age-related macular degeneration: Secondary | ICD-10-CM | POA: Diagnosis not present

## 2014-05-29 ENCOUNTER — Encounter: Payer: Self-pay | Admitting: Family Medicine

## 2014-05-29 ENCOUNTER — Ambulatory Visit (INDEPENDENT_AMBULATORY_CARE_PROVIDER_SITE_OTHER): Payer: Medicare Other | Admitting: Family Medicine

## 2014-05-29 VITALS — BP 150/72 | HR 70 | Temp 97.1°F | Ht 72.5 in | Wt 201.0 lb

## 2014-05-29 DIAGNOSIS — Z87891 Personal history of nicotine dependence: Secondary | ICD-10-CM | POA: Insufficient documentation

## 2014-05-29 DIAGNOSIS — I10 Essential (primary) hypertension: Secondary | ICD-10-CM

## 2014-05-29 DIAGNOSIS — R739 Hyperglycemia, unspecified: Secondary | ICD-10-CM

## 2014-05-29 DIAGNOSIS — E785 Hyperlipidemia, unspecified: Secondary | ICD-10-CM | POA: Diagnosis not present

## 2014-05-29 DIAGNOSIS — H3530 Unspecified macular degeneration: Secondary | ICD-10-CM | POA: Insufficient documentation

## 2014-05-29 DIAGNOSIS — K219 Gastro-esophageal reflux disease without esophagitis: Secondary | ICD-10-CM

## 2014-05-29 MED ORDER — OMEPRAZOLE 20 MG PO CPDR: 20.0000 mg | DELAYED_RELEASE_CAPSULE | Freq: Every day | ORAL | Status: DC

## 2014-05-29 MED ORDER — HYDROCHLOROTHIAZIDE 25 MG PO TABS: 25.0000 mg | ORAL_TABLET | Freq: Every day | ORAL | Status: DC

## 2014-05-29 NOTE — Assessment & Plan Note (Signed)
prilosec 20mg . Printed rx so patient could send in for coverage for OTC product through his insurance.

## 2014-05-29 NOTE — Assessment & Plan Note (Signed)
Fish oil with history high trilycerides. Last LDL Diet controlled with value<100 when previously >100. Check lipids to make sure maintains diet/exercise control though unlikely to suggest statin for primary prevention in age group.

## 2014-05-29 NOTE — Progress Notes (Signed)
Tim Reddish, MD Phone: [phone]  Subjective:  Patient presents today to establish care with me as their new primary care provider. Patient was formerly a patient of Dr. Leanne Chang. Chief complaint-noted.   Hypertension-mild poor control on hctz 12.5mg   BP Readings from Last 3 Encounters:  05/29/14 150/72  11/24/13 138/72  08/08/13 132/78  Home BP monitoring-has cuff, does not check Compliant with medications-yes without side effects ROS-Denies any CP, HA, SOB, blurry vision, LE edema.  Hyperlipidemia-previously controlled on fish oil alone, history of both triglyceride and LDL elevations  Lab Results  Component Value Date   LDLCALC 97 12/31/2011  On statin: no Regular exercise: no, advised ROS- no chest pain or shortness of breath. No myalgias  GERD- reasonable control Patient states every once and a while he will have mild discomfort in the evening time if eats particularly spicy meal. He is taking prilosec daily.  ROS- no shortness of breath   The following were reviewed and entered/updated in epic: Past Medical History  Diagnosis Date  . GERD (gastroesophageal reflux disease)   . Hypertension   . Hyperlipidemia   . Colonic polyp   . Pneumonia 2002  . Muscular degeneration   . Testicular cyst     removed-noncancerous   Patient Active Problem List   Diagnosis Date Noted  . Hyperlipidemia 04/29/2007    Priority: Medium  . Essential hypertension 04/29/2007    Priority: Medium  . Former smoker 05/29/2014    Priority: Low  . Macular degeneration     Priority: Low  . GERD 04/29/2007    Priority: Low  . History of colonic polyps 04/29/2007    Priority: Low   Past Surgical History  Procedure Laterality Date  . Knee surgery      scope on both  . Lumbar laminectomy    . Transurethral resection of prostate      resolved issues  . Cataract extraction      right side    Family History  Problem Relation Age of Onset  . Diabetes Mother   . Hypertension  Mother   . Heart attack Father     late 53s, former smoker  . Heart disease Brother     62s, smokers    Medications- reviewed and updated Current Outpatient Prescriptions  Medication Sig Dispense Refill  . aspirin 81 MG tablet Take 81 mg by mouth daily.    . hydrochlorothiazide (HYDRODIURIL) 25 MG tablet Take 1 tablet (25 mg total) by mouth daily. 90 tablet 3  . Multiple Vitamin (MULTIVITAMIN) tablet Take 1 tablet by mouth daily.      . Omega-3 Fatty Acids (FISH OIL) 1000 MG CPDR Take by mouth.    Marland Kitchen omeprazole (PRILOSEC) 20 MG capsule Take 1 capsule (20 mg total) by mouth daily. 90 capsule 3   Allergies-reviewed and updated Allergies  Allergen Reactions  . Celebrex [Celecoxib] Hives    History   Social History  . Marital Status: Married    Spouse Name: N/A    Number of Children: N/A  . Years of Education: N/A   Social History Main Topics  . Smoking status: Former Smoker -- 1.50 packs/day for 40 years    Types: Cigarettes    Quit date: 12/30/1990  . Smokeless tobacco: Never Used  . Alcohol Use: No  . Drug Use: No  . Sexual Activity: Not on file   Other Topics Concern  . Not on file   Social History Narrative   Married (2nd marriage-79). No children. Dog.  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       Hobbies: tv, walks daily or every other day    ROS--See HPI   Objective: BP 150/72 mmHg  Pulse 70  Temp(Src) 97.1 F (36.2 C) (Oral)  Ht 6' 0.5" (1.842 m)  Wt 201 lb (91.173 kg)  BMI 26.87 kg/m2 Gen: NAD, resting comfortably CV: RRR no murmurs rubs or gallops Lungs: CTAB no crackles, wheeze, rhonchi Abdomen: soft/nontender/nondistended/normal bowel sounds.  Ext: no edema Skin: warm, dry   Assessment/Plan:  Essential hypertension Mild poor control on 12.5mg  hctz. Has been well controlled on last 2 readings. Patient to monitor at home, will return if regularly over 150 SBP and we would increase to 25mg  hctz at that time.    Hyperlipidemia Fish oil with history high trilycerides. Last LDL Diet controlled with value<100 when previously >100. Check lipids to make sure maintains diet/exercise control though unlikely to suggest statin for primary prevention in age group.   GERD prilosec 20mg . Printed rx so patient could send in for coverage for OTC product through his insurance.   Return precautions advised. Schedule next available AWV.   Return for future fasting labs in next 1-2 weeks. No PSA as minimal nocturia.  Orders Placed This Encounter  Procedures  . CBC    [place]    Standing Status: Future     Number of Occurrences:      Standing Expiration Date: 05/30/2015  . Comprehensive metabolic panel    [place]    Standing Status: Future     Number of Occurrences:      Standing Expiration Date: 05/30/2015    Order Specific Question:  Has the patient fasted?    Answer:  No  . Lipid panel    [place]    Standing Status: Future     Number of Occurrences:      Standing Expiration Date: 05/30/2015    Order Specific Question:  Has the patient fasted?    Answer:  No  . TSH    [place]    Standing Status: Future     Number of Occurrences:      Standing Expiration Date: 05/30/2015  . Hemoglobin A1c    [place]    Standing Status: Future     Number of Occurrences:      Standing Expiration Date: 05/30/2015   Refilled at 25mg  because suspicious will need full tab, but patient to take half for now Meds ordered this encounter  Medications  . omeprazole (PRILOSEC) 20 MG capsule    Sig: Take 1 capsule (20 mg total) by mouth daily.    Dispense:  90 capsule    Refill:  3  . hydrochlorothiazide (HYDRODIURIL) 25 MG tablet    Sig: Take 1 tablet (25 mg total) by mouth daily.    Dispense:  90 tablet    Refill:  3

## 2014-05-29 NOTE — Patient Instructions (Addendum)
Come in for fasting labs sometime in the next 1-2 weeks  Prevnar today  (pneumonia booster)  Stop vitamin E  Refilled prilosec for a year for you to mail in.   Blood pressure a little high today. Please keep an eye on it at home checking 1-2x a week. If regularly above 150 on the top number, come see me.   Otherwise, let's check in at your annual wellness visit which I would advise you to schedule today.

## 2014-05-29 NOTE — Progress Notes (Signed)
Pre visit review using our clinic review tool, if applicable. No additional management support is needed unless otherwise documented below in the visit note. 

## 2014-05-29 NOTE — Assessment & Plan Note (Signed)
Mild poor control on 12.5mg  hctz. Has been well controlled on last 2 readings. Patient to monitor at home, will return if regularly over 150 SBP and we would increase to 25mg  hctz at that time.

## 2014-06-07 ENCOUNTER — Other Ambulatory Visit (INDEPENDENT_AMBULATORY_CARE_PROVIDER_SITE_OTHER): Payer: Medicare Other

## 2014-06-07 DIAGNOSIS — I10 Essential (primary) hypertension: Secondary | ICD-10-CM | POA: Diagnosis not present

## 2014-06-07 DIAGNOSIS — R739 Hyperglycemia, unspecified: Secondary | ICD-10-CM

## 2014-06-07 DIAGNOSIS — E785 Hyperlipidemia, unspecified: Secondary | ICD-10-CM | POA: Diagnosis not present

## 2014-06-07 LAB — COMPREHENSIVE METABOLIC PANEL
ALT: 14 U/L (ref 0–53)
AST: 19 U/L (ref 0–37)
Albumin: 4.1 g/dL (ref 3.5–5.2)
Alkaline Phosphatase: 44 U/L (ref 39–117)
BILIRUBIN TOTAL: 0.5 mg/dL (ref 0.2–1.2)
BUN: 26 mg/dL — ABNORMAL HIGH (ref 6–23)
CALCIUM: 9.2 mg/dL (ref 8.4–10.5)
CO2: 30 mEq/L (ref 19–32)
CREATININE: 1.46 mg/dL (ref 0.40–1.50)
Chloride: 106 mEq/L (ref 96–112)
GFR: 49.67 mL/min — AB (ref >60.00)
Glucose, Bld: 105 mg/dL — ABNORMAL HIGH (ref 70–99)
Potassium: 4 mEq/L (ref 3.5–5.1)
SODIUM: 142 meq/L (ref 135–145)
TOTAL PROTEIN: 6.8 g/dL (ref 6.0–8.3)

## 2014-06-07 LAB — CBC
HCT: 42.2 % (ref 39.0–52.0)
HEMOGLOBIN: 14.4 g/dL (ref 13.0–17.0)
MCHC: 34.3 g/dL (ref 30.0–36.0)
MCV: 94.7 fl (ref 78.0–100.0)
Platelets: 193 10*3/uL (ref 150.0–400.0)
RBC: 4.45 Mil/uL (ref 4.22–5.81)
RDW: 13.6 % (ref 11.5–15.5)
WBC: 6.9 10*3/uL (ref 4.0–10.5)

## 2014-06-07 LAB — POCT URINALYSIS DIPSTICK
BILIRUBIN UA: NEGATIVE
GLUCOSE UA: NEGATIVE
Ketones, UA: NEGATIVE
Leukocytes, UA: NEGATIVE
Nitrite, UA: NEGATIVE
Protein, UA: NEGATIVE
RBC UA: NEGATIVE
Spec Grav, UA: 1.02
Urobilinogen, UA: 0.2
pH, UA: 7

## 2014-06-07 LAB — LIPID PANEL
CHOL/HDL RATIO: 4
CHOLESTEROL: 164 mg/dL (ref 0–200)
HDL: 36.8 mg/dL — AB (ref >39.00)
LDL Cholesterol: 91 mg/dL (ref 0–99)
NonHDL: 127.2
TRIGLYCERIDES: 182 mg/dL — AB (ref 0.0–149.0)
VLDL: 36.4 mg/dL (ref 0.0–40.0)

## 2014-06-07 LAB — HEMOGLOBIN A1C: Hgb A1c MFr Bld: 6.4 % (ref 4.6–6.5)

## 2014-06-07 LAB — TSH: TSH: 4.51 u[IU]/mL — ABNORMAL HIGH (ref 0.35–4.50)

## 2014-06-07 NOTE — Addendum Note (Signed)
Addended by: Donnita Falls on: 06/07/2014 08:23 AM   Modules accepted: Orders

## 2014-06-08 ENCOUNTER — Other Ambulatory Visit: Payer: Self-pay | Admitting: Family Medicine

## 2014-06-08 ENCOUNTER — Ambulatory Visit (INDEPENDENT_AMBULATORY_CARE_PROVIDER_SITE_OTHER): Payer: Medicare Other | Admitting: Family Medicine

## 2014-06-08 ENCOUNTER — Encounter: Payer: Self-pay | Admitting: Family Medicine

## 2014-06-08 VITALS — BP 142/58 | Temp 97.9°F | Wt 200.0 lb

## 2014-06-08 DIAGNOSIS — N183 Chronic kidney disease, stage 3 unspecified: Secondary | ICD-10-CM

## 2014-06-08 DIAGNOSIS — R739 Hyperglycemia, unspecified: Secondary | ICD-10-CM | POA: Insufficient documentation

## 2014-06-08 DIAGNOSIS — Z23 Encounter for immunization: Secondary | ICD-10-CM

## 2014-06-08 DIAGNOSIS — IMO0001 Reserved for inherently not codable concepts without codable children: Secondary | ICD-10-CM

## 2014-06-08 DIAGNOSIS — E785 Hyperlipidemia, unspecified: Secondary | ICD-10-CM

## 2014-06-08 DIAGNOSIS — Z0000 Encounter for general adult medical examination without abnormal findings: Secondary | ICD-10-CM

## 2014-06-08 DIAGNOSIS — I10 Essential (primary) hypertension: Secondary | ICD-10-CM

## 2014-06-08 MED ORDER — LISINOPRIL 5 MG PO TABS: 5.0000 mg | ORAL_TABLET | Freq: Every day | ORAL | Status: DC

## 2014-06-08 NOTE — Assessment & Plan Note (Signed)
Controlled with fish oil and diet alone. No statin indicated with LDL <100 and triglycerides ,200.

## 2014-06-08 NOTE — Patient Instructions (Signed)
You have some moderate age related changes to your kidney. In order to protect your kidneys, we are starting you on a medication called lisinopril. Take 2.5mg  for 1 week then increase to 5mg . See me in 2 weeks and we will recheck your blood pressure and kidney function.   Blood pressure still a hair high and this will help with that as well.

## 2014-06-08 NOTE — Assessment & Plan Note (Addendum)
Add lisinopril 5mg  to hctz 12.5mg  given CKD stage III noted on labs. F/u 2 weeks with BP and bmet.

## 2014-06-08 NOTE — Assessment & Plan Note (Signed)
Lab Results  Component Value Date   HGBA1C 6.4 06/07/2014   Discussed increased DM risk-advised healthy eating and regular exercise with recheck 6-12 months.

## 2014-06-08 NOTE — Progress Notes (Signed)
Tim Reddish, MD Phone: [phone]  Subjective:  Patient presents today for their annual wellness visit.    Chronic health issues Hypertension-mild poor control on hctz 12.5mg  Hyperglycemia-new issue added to problem list. Walks 2x a week CKD Stage III-denies changes in urination pattern. No regular nsaids-perhaps once a week HLD-controlled without medication other than fish oil.  ROS-no chest pain, shortness of breath, changes in urination pattern.   Preventive Screening-Counseling & Management  Smoking Status: Former Smoker, 60 pack years quit in 1992, outside CT scan window and AAA window Second Hand Smoking status: No smokers in home  Risk Factors Regular exercise: walk 2x a week, about a mile Diet: varied-fruits,vegetables, meats and generally healthy  Fall Risk: None   Cardiac risk factors:  advanced age (older than 59 for men, 52 for women) yes Hyperlipidemia history of high cholesterol but improved on rececnt checks, triglycerides slightly high but not in treatment range, HDL slightly low No diabetes.  Family History: MI father late 45s, brother in 76s   Depression Screen None. PHQ2 0  Activities of Daily Living Independent ADLs and IADLs   Hearing Difficulties: patient has hearing aid but does not wear them  Cognitive Testing No reported trouble.   Normal 3 word recall  List the Names of Other Physician/Practitioners you currently use: 1.Dr. Yong Channel Dentist 2. Optho Dr. Farley Ly degeration 3. Dr. Olevia Perches GI for colonoscopies  Immunization History  Administered Date(s) Administered  . Influenza Split 02/26/2011, 02/23/2012  . Influenza Whole 02/17/2007, 02/01/2008, 02/16/2010  . Influenza, High Dose Seasonal PF 02/20/2014  . Influenza,inj,Quad PF,36+ Mos 02/02/2013  . Pneumococcal Conjugate-13 06/08/2014  . Pneumococcal Polysaccharide-23 12/30/2010  . Td 12/24/2009  . Zoster 05/19/2005   Required Immunizations needed  today-given Prevnar  Screening tests- up to date  ROS- No pertinent positives discovered in course of AWV. Pertinent negative-does not have nocturia everynight. Not interested in PSA testing.   The following were reviewed and entered/updated in epic: Past Medical History  Diagnosis Date  . GERD (gastroesophageal reflux disease)   . Hypertension   . Hyperlipidemia   . Colonic polyp   . Pneumonia 2002  . Muscular degeneration   . Testicular cyst     removed-noncancerous   Patient Active Problem List   Diagnosis Date Noted  . CKD (chronic kidney disease), stage III 06/08/2014    Priority: Medium  . Hyperglycemia 06/08/2014    Priority: Medium  . Hyperlipidemia 04/29/2007    Priority: Medium  . Essential hypertension 04/29/2007    Priority: Medium  . Former smoker 05/29/2014    Priority: Low  . Macular degeneration     Priority: Low  . GERD 04/29/2007    Priority: Low  . History of colonic polyps 04/29/2007    Priority: Low   Past Surgical History  Procedure Laterality Date  . Knee surgery      scope on both  . Lumbar laminectomy    . Transurethral resection of prostate      resolved issues  . Cataract extraction      right side    Family History  Problem Relation Age of Onset  . Diabetes Mother   . Hypertension Mother   . Heart attack Father     late 77s, former smoker  . Heart disease Brother     71s, smokers    Medications- reviewed and updated Current Outpatient Prescriptions  Medication Sig Dispense Refill  . aspirin 81 MG tablet Take 81 mg by mouth daily.    Marland Kitchen  hydrochlorothiazide (HYDRODIURIL) 25 MG tablet Take 1 tablet (25 mg total) by mouth daily. 90 tablet 3  . Multiple Vitamin (MULTIVITAMIN) tablet Take 1 tablet by mouth daily.      . Omega-3 Fatty Acids (FISH OIL) 1000 MG CPDR Take by mouth.    Marland Kitchen omeprazole (PRILOSEC) 20 MG capsule Take 1 capsule (20 mg total) by mouth daily. 90 capsule 3  . lisinopril (PRINIVIL,ZESTRIL) 5 MG tablet Take 1  tablet (5 mg total) by mouth daily. 30 tablet 5   No current facility-administered medications for this visit.    Allergies-reviewed and updated Allergies  Allergen Reactions  . Celebrex [Celecoxib] Hives    History   Social History  . Marital Status: Married    Spouse Name: N/A    Number of Children: N/A  . Years of Education: N/A   Social History Main Topics  . Smoking status: Former Smoker -- 1.50 packs/day for 40 years    Types: Cigarettes    Quit date: 12/30/1990  . Smokeless tobacco: Never Used  . Alcohol Use: No  . Drug Use: No  . Sexual Activity: None   Other Topics Concern  . None   Social History Narrative   Married (2nd marriage-79). No children. Dog.       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       Hobbies: tv, walks daily or every other day    Objective: BP 142/58 mmHg  Temp(Src) 97.9 F (36.6 C)  Wt 200 lb (90.719 kg) Gen: NAD, resting comfortably Neck: no thyromegaly CV: RRR no murmurs rubs or gallops Lungs: CTAB no crackles, wheeze, rhonchi  Assessment/Plan:  AWV completed and only risk is hearing-patient does not want to wear hearing aids he has available.  Reviewed labs from last week.   Essential hypertension Add lisinopril 5mg  to hctz 12.5mg  given CKD stage III noted on labs. F/u 2 weeks with BP and bmet.    Hyperglycemia Lab Results  Component Value Date   HGBA1C 6.4 06/07/2014   Discussed increased DM risk-advised healthy eating and regular exercise with recheck 6-12 months.    CKD (chronic kidney disease), stage III Start lisinopril. GFR 45-55. Repeat bmet to ensure <30% increase in cr.    Hyperlipidemia Controlled with fish oil and diet alone. No statin indicated with LDL <100 and triglycerides ,200.     Return precautions advised. 2 week follow up.   Orders Placed This Encounter  Procedures  . Pneumococcal conjugate vaccine 13-valent

## 2014-06-08 NOTE — Assessment & Plan Note (Signed)
Start lisinopril. GFR 45-55. Repeat bmet to ensure <30% increase in cr.

## 2014-06-30 ENCOUNTER — Ambulatory Visit (INDEPENDENT_AMBULATORY_CARE_PROVIDER_SITE_OTHER): Payer: Medicare Other | Admitting: Family Medicine

## 2014-06-30 ENCOUNTER — Encounter: Payer: Self-pay | Admitting: Family Medicine

## 2014-06-30 VITALS — BP 124/70 | HR 64 | Temp 97.9°F | Wt 196.0 lb

## 2014-06-30 DIAGNOSIS — M549 Dorsalgia, unspecified: Secondary | ICD-10-CM

## 2014-06-30 DIAGNOSIS — N183 Chronic kidney disease, stage 3 unspecified: Secondary | ICD-10-CM

## 2014-06-30 DIAGNOSIS — R42 Dizziness and giddiness: Secondary | ICD-10-CM

## 2014-06-30 DIAGNOSIS — I10 Essential (primary) hypertension: Secondary | ICD-10-CM

## 2014-06-30 NOTE — Progress Notes (Signed)
Pre visit review using our clinic review tool, if applicable. No additional management support is needed unless otherwise documented below in the visit note. 

## 2014-06-30 NOTE — Progress Notes (Signed)
Garret Reddish, MD Phone: [phone]  Subjective:   Tim Ross is a 80 y.o. year old very pleasant male patient who presents with the following:  2 weeks ago started lisinopril for CKD III and HTN in addition to 12.5mg  HCTZ. He stopped lisinopril after 3 days due to continued stomach upset. He switched to taking a full tab of hctz at 25mg . Since the time of lowering blood pressure, patient has had intermittent lightheadedness and feelings that he may pass out. Blood pressures at home in the 120s to 130s range. He has not passed out  BP Readings from Last 3 Encounters:  06/30/14 124/70  06/08/14 142/58  05/29/14 150/72   He also has developed some mid back right sided pain that is worse with position changes. Ibuprofen helps this pain but used sparingly due to CKD.    ROS-Denies any CP, HA, SOB, blurry vision, LE edema. No slurred speech, extremity weakness. No palpitations. No fecal or urinary incontinence. No leg weakness. No dysuria. No fever/chills.   Past Medical History- Patient Active Problem List   Diagnosis Date Noted  . CKD (chronic kidney disease), stage III 06/08/2014    Priority: Medium  . Hyperglycemia 06/08/2014    Priority: Medium  . Hyperlipidemia 04/29/2007    Priority: Medium  . Essential hypertension 04/29/2007    Priority: Medium  . Former smoker 05/29/2014    Priority: Low  . Macular degeneration     Priority: Low  . GERD 04/29/2007    Priority: Low  . History of colonic polyps 04/29/2007    Priority: Low   Medications- reviewed and updated Current Outpatient Prescriptions  Medication Sig Dispense Refill  . aspirin 81 MG tablet Take 81 mg by mouth daily.    . hydrochlorothiazide (HYDRODIURIL) 25 MG tablet Take 1 tablet (25 mg total) by mouth daily. 90 tablet 3  . Multiple Vitamin (MULTIVITAMIN) tablet Take 1 tablet by mouth daily.      . Omega-3 Fatty Acids (FISH OIL) 1000 MG CPDR Take by mouth.    Marland Kitchen omeprazole (PRILOSEC) 20 MG capsule Take  1 capsule (20 mg total) by mouth daily. 90 capsule 3   No current facility-administered medications for this visit.    Objective: BP 124/70 mmHg  Pulse 64  Temp(Src) 97.9 F (36.6 C) (Oral)  Wt 196 lb (88.905 kg) Gen: NAD, resting comfortably CV: RRR no murmurs rubs or gallops Lungs: CTAB no crackles, wheeze, rhonchi Abdomen: soft/nontender/nondistended/normal bowel sounds.  Ext: no edema Skin: warm, dry, no rash Neuro: 5/5 strength in lower extremities, normal reflexes. CN II-XII intact, sensation and reflexes normal throughout, . Normal finger to nose. Normal rapid alternating movements. Normal gait.  No saddle anesthesia MSk: Right mid back with mild spasm. No cva tenderness    Assessment/Plan:  CKD (chronic kidney disease), stage III Unfortunately, does not appear patient can tolerate ace-i or improved BP control (lightheadedness). We will hope for SBP <150 remains tolerable and hope fo rno progression of disease.    Essential hypertension Lisinopril at low dose and HCTZ at 25 mg both seem to cause dizzy spells/presyncope feelings. We will cut lisinopril out and go back to hctz 12.5mg  and hope for BP goal <150 sbp.    R mid back pain-discussed spasm in the back. Would like to use flexeril but with dizzy/presyncope issues did not want to confue picutre. Advised massage and ice then heat.   Dizziness/presyncope-could not reproduce in office today. Could be orthostatic but unclear. Patient may simply not be  able to tolerate lower BP. Reduce Bp medications as above. This could also be arythmia related but normal rate and rhythm on exam today. Normal neurological exam today as well. We discussed reasons for immediate follow up but will follow up next week to make sure that there is resolution with reducing BP medications. Patient aware of potential cardaic risks without further workup at this time but also of strong possibility this is medication related given timing with increasing  BP meds.   Return precautions advised. Follow up next week.

## 2014-06-30 NOTE — Patient Instructions (Signed)
Blood pressure looks great but now you are feeling lightheaded and I am concerned your lower blood pressure is causing this. Let's have you go back to the 1/2 tab of your blood pressure medicine. Continue off lisinopril.   See [place] back next week and monitor your symptoms to see when they occur (do a little journal). If your symptoms resolve, then we will attribute it to the blood pressure. If you have new or worsening symptoms, please seek care sooner especially if you feel any chest pain or heart racing or shortness of breath  Spasm in your right mid back. Recommend daily massage. Ice packs 2-3x a day for 15-20 minutes for 3 days then transition to heat. Avoid ibuprofen due to kidney changes. Avoid muscle relaxant for now to not confuse the picture with the dizziness.

## 2014-07-01 NOTE — Assessment & Plan Note (Signed)
Unfortunately, does not appear patient can tolerate ace-i or improved BP control (lightheadedness). We will hope for SBP <150 remains tolerable and hope fo rno progression of disease.

## 2014-07-01 NOTE — Assessment & Plan Note (Signed)
Lisinopril at low dose and HCTZ at 25 mg both seem to cause dizzy spells/presyncope feelings. We will cut lisinopril out and go back to hctz 12.5mg  and hope for BP goal <150 sbp.

## 2014-07-07 ENCOUNTER — Ambulatory Visit: Payer: Medicare Other | Admitting: Family Medicine

## 2014-07-27 ENCOUNTER — Encounter (INDEPENDENT_AMBULATORY_CARE_PROVIDER_SITE_OTHER): Payer: Medicare Other | Admitting: Ophthalmology

## 2014-07-27 DIAGNOSIS — H35033 Hypertensive retinopathy, bilateral: Secondary | ICD-10-CM

## 2014-07-27 DIAGNOSIS — I10 Essential (primary) hypertension: Secondary | ICD-10-CM | POA: Diagnosis not present

## 2014-07-27 DIAGNOSIS — H3531 Nonexudative age-related macular degeneration: Secondary | ICD-10-CM | POA: Diagnosis not present

## 2014-07-27 DIAGNOSIS — H43813 Vitreous degeneration, bilateral: Secondary | ICD-10-CM | POA: Diagnosis not present

## 2014-07-27 DIAGNOSIS — H3532 Exudative age-related macular degeneration: Secondary | ICD-10-CM | POA: Diagnosis not present

## 2014-07-27 DIAGNOSIS — H2511 Age-related nuclear cataract, right eye: Secondary | ICD-10-CM | POA: Diagnosis not present

## 2014-09-18 ENCOUNTER — Ambulatory Visit (INDEPENDENT_AMBULATORY_CARE_PROVIDER_SITE_OTHER): Payer: Medicare Other | Admitting: Family Medicine

## 2014-09-18 ENCOUNTER — Ambulatory Visit (INDEPENDENT_AMBULATORY_CARE_PROVIDER_SITE_OTHER)
Admission: RE | Admit: 2014-09-18 | Discharge: 2014-09-18 | Disposition: A | Discharge status: Home / Self Care | Payer: Medicare Other | Source: Ambulatory Visit | Attending: Family Medicine | Admitting: Family Medicine

## 2014-09-18 ENCOUNTER — Encounter: Payer: Self-pay | Admitting: Family Medicine

## 2014-09-18 VITALS — BP 156/76 | HR 85 | Temp 99.3°F | Ht 72.05 in | Wt 198.0 lb

## 2014-09-18 DIAGNOSIS — Z9109 Other allergy status, other than to drugs and biological substances: Secondary | ICD-10-CM

## 2014-09-18 DIAGNOSIS — Z91048 Other nonmedicinal substance allergy status: Secondary | ICD-10-CM | POA: Diagnosis not present

## 2014-09-18 DIAGNOSIS — M546 Pain in thoracic spine: Secondary | ICD-10-CM

## 2014-09-18 DIAGNOSIS — M47814 Spondylosis without myelopathy or radiculopathy, thoracic region: Secondary | ICD-10-CM | POA: Diagnosis not present

## 2014-09-18 MED ORDER — DICLOFENAC SODIUM 75 MG PO TBEC: 75.0000 mg | DELAYED_RELEASE_TABLET | Freq: Two times a day (BID) | ORAL | Status: DC

## 2014-09-18 NOTE — Progress Notes (Signed)
   Subjective:    Patient ID: Tim Ross, male    DOB: 1935-04-25, 80 y.o.   MRN: [id number]  HPI Here for several weeks of sharp pain in the right middle back. No recent trauma or falls. No SOB. He has had a dry cough and some allergy symptoms like stuffy head and PND. Heat and Aleve help a little.    Review of Systems  Constitutional: Negative.   HENT: Positive for congestion and postnasal drip. Negative for sinus pressure.   Eyes: Negative.   Respiratory: Positive for cough. Negative for chest tightness and shortness of breath.   Cardiovascular: Negative.   Musculoskeletal: Positive for back pain.       Objective:   Physical Exam  Constitutional: He appears well-developed.  HENT:  Right Ear: External ear normal.  Left Ear: External ear normal.  Eyes: Conjunctivae are normal.  Cardiovascular: Normal rate, regular rhythm, normal heart sounds and intact distal pulses.   Pulmonary/Chest: Effort normal and breath sounds normal. No respiratory distress. He has no wheezes. He has no rales.  Musculoskeletal:  He is tender along the right side of the thoracic spine. There is some muscle spasm present. ROM is restricted by pain.  Lymphadenopathy:    He has no cervical adenopathy.          Assessment & Plan:  For the allergies he can use Claritin and Delsym prn. For the back pain, try Diclofenac and we will set up thoracic spine Xrays.

## 2014-09-18 NOTE — Progress Notes (Signed)
Pre visit review using our clinic review tool, if applicable. No additional management support is needed unless otherwise documented below in the visit note. 

## 2014-10-03 ENCOUNTER — Encounter: Payer: Self-pay | Admitting: Family Medicine

## 2014-10-03 ENCOUNTER — Ambulatory Visit (INDEPENDENT_AMBULATORY_CARE_PROVIDER_SITE_OTHER): Payer: Medicare Other | Admitting: Family Medicine

## 2014-10-03 ENCOUNTER — Ambulatory Visit (INDEPENDENT_AMBULATORY_CARE_PROVIDER_SITE_OTHER)
Admission: RE | Admit: 2014-10-03 | Discharge: 2014-10-03 | Disposition: A | Discharge status: Home / Self Care | Payer: Medicare Other | Source: Ambulatory Visit | Attending: Family Medicine | Admitting: Family Medicine

## 2014-10-03 VITALS — BP 140/72 | HR 72 | Temp 98.0°F | Wt 197.0 lb

## 2014-10-03 DIAGNOSIS — M549 Dorsalgia, unspecified: Secondary | ICD-10-CM

## 2014-10-03 DIAGNOSIS — J189 Pneumonia, unspecified organism: Secondary | ICD-10-CM

## 2014-10-03 DIAGNOSIS — R918 Other nonspecific abnormal finding of lung field: Secondary | ICD-10-CM | POA: Diagnosis not present

## 2014-10-03 DIAGNOSIS — R0781 Pleurodynia: Secondary | ICD-10-CM | POA: Diagnosis not present

## 2014-10-03 MED ORDER — AMOXICILLIN-POT CLAVULANATE 875-125 MG PO TABS: 1.0000 | ORAL_TABLET | Freq: Two times a day (BID) | ORAL | Status: DC

## 2014-10-03 MED ORDER — AZITHROMYCIN 250 MG PO TABS: ORAL_TABLET | ORAL | Status: DC

## 2014-10-03 NOTE — Progress Notes (Signed)
Garret Reddish, MD  Subjective:  Tim Ross is a 80 y.o. year old very pleasant male patient who presents with:  -R mid back pain issues since February- got better for a bit with massage and heat then worsened again, seen in early may and tried voltaren twice a day. Helped for a week then stopped helping while still taking. X-ray thoracic spine showed diffuse degenerative changes. Cough or riding lawnmower make it worse when hits bumps. Worse if trying to stand up using right elbow. Still no history of trauma or falls.1-2/10 at rest. With cough, 10/10 pain. L shoulder pain gone with voltaren.   ROS- no unintentional weight loss, worsening fatigue other than being limited by back. No regular cough issues or hemoptysis though obviously has had some cough though that allergy related. No saddle anesthesia, bladder incontinence, fecal incontinence, weakness in extremity, numbness or tingling in extremity. No fever.   Past Medical History- HLD, HTN, CKD III  Medications- reviewed and updated Current Outpatient Prescriptions  Medication Sig Dispense Refill  . aspirin 81 MG tablet Take 81 mg by mouth daily.    . diclofenac (VOLTAREN) 75 MG EC tablet Take 1 tablet (75 mg total) by mouth 2 (two) times daily. 60 tablet 0  . hydrochlorothiazide (MICROZIDE) 12.5 MG capsule Take 12.5 mg by mouth daily.    . Multiple Vitamin (MULTIVITAMIN) tablet Take 1 tablet by mouth daily.      . Omega-3 Fatty Acids (FISH OIL) 1000 MG CPDR Take by mouth.    Marland Kitchen omeprazole (PRILOSEC) 20 MG capsule Take 1 capsule (20 mg total) by mouth daily. 90 capsule 3   Objective: BP 140/72 mmHg  Pulse 72  Temp(Src) 98 F (36.7 C)  Wt 197 lb (89.359 kg) Gen: NAD, resting comfortably CV: RRR no murmurs rubs or gallops Lungs: CTAB no crackles, wheeze, rhonchi  MSK: no pain on left side of back, no pain in midline Pain palpated over right lower rib and there is a bony prominence when compared to left side. Previously I had  felt an area of muscle spasm just to the left of this area but I no longer feel this today.   Neuro: 5/5 strength in upper and lower extremities, no saddle anesthesia, normal gait  Dg Ribs Unilateral Right  10/03/2014   CLINICAL DATA:  Right mid posterior rib pain with turning and cough. No known injury.  EXAM: RIGHT RIBS - 2 VIEW  COMPARISON:  05/26/2013  FINDINGS: No focal rib lesion or fracture noted. Patchy opacity noted in the right upper lobe concerning for pneumonia. No right effusion pneumothorax.  IMPRESSION: No rib abnormality/ fracture.  Patchy right upper lobe opacity concerning for pneumonia. Followup PA and lateral chest X-ray is recommended in 3-4 weeks following trial of antibiotic therapy to ensure resolution and exclude underlying malignancy.   Electronically Signed   By: Rolm Baptise M.D.   On: 10/03/2014 13:36   Dg Thoracic Spine 2 View  09/18/2014   CLINICAL DATA:  Right-sided pain.  Initial evaluation .  EXAM: THORACIC SPINE - 2 VIEW  COMPARISON:  None.  FINDINGS: Diffuse degenerative changes noted of the thoracic spine. No acute bony abnormality identified.  IMPRESSION: No acute abnormality.  Diffuse degenerative change.   Electronically Signed   By: Marcello Moores  Register   On: 09/18/2014 11:58    Assessment/Plan:  R mid back pain which appears to be caused by pneumonia Results of rib films show patchy infiltrate concerning for PNA. This would be CAP. Treat  with azithromycin and Augmentin. Follow up 4 weeks after antibiotics. If pain resoled, plan repeat CXR for clearance. If not resolved, would plan on CT chest with contrast likely. No rib abnormalities as I was concerned about on exam noted on rib films.   5 week follow up-4 weeks after last day antibiotic  Orders Placed This Encounter  Procedures  . DG Ribs Unilateral Right   Meds ordered this encounter  Medications  . hydrochlorothiazide (MICROZIDE) 12.5 MG capsule    Sig: Take 12.5 mg by mouth daily.  Marland Kitchen  amoxicillin-clavulanate (AUGMENTIN) 875-125 MG per tablet    Sig: Take 1 tablet by mouth 2 (two) times daily.    Dispense:  14 tablet    Refill:  0  . azithromycin (ZITHROMAX) 250 MG tablet    Sig: Take 2 tabs on day 1, then 1 tab daily until finished    Dispense:  6 tablet    Refill:  0

## 2014-10-03 NOTE — Patient Instructions (Signed)
Get rib films. If negative, refer to Dr. Tonita Cong. We need to get to the bottom of this with 3 months of pain now.

## 2014-10-17 ENCOUNTER — Emergency Department (HOSPITAL_COMMUNITY)
Admission: EM | Admit: 2014-10-17 | Discharge: 2014-10-17 | Disposition: A | Discharge status: Home / Self Care | Payer: Medicare Other | Attending: Emergency Medicine | Admitting: Emergency Medicine

## 2014-10-17 ENCOUNTER — Emergency Department (HOSPITAL_COMMUNITY): Payer: Medicare Other

## 2014-10-17 ENCOUNTER — Telehealth: Payer: Self-pay | Admitting: Family Medicine

## 2014-10-17 ENCOUNTER — Encounter (HOSPITAL_COMMUNITY): Payer: Self-pay | Admitting: Emergency Medicine

## 2014-10-17 DIAGNOSIS — R918 Other nonspecific abnormal finding of lung field: Secondary | ICD-10-CM | POA: Insufficient documentation

## 2014-10-17 DIAGNOSIS — Z86010 Personal history of colonic polyps: Secondary | ICD-10-CM | POA: Insufficient documentation

## 2014-10-17 DIAGNOSIS — R9389 Abnormal findings on diagnostic imaging of other specified body structures: Secondary | ICD-10-CM

## 2014-10-17 DIAGNOSIS — Z87448 Personal history of other diseases of urinary system: Secondary | ICD-10-CM | POA: Diagnosis not present

## 2014-10-17 DIAGNOSIS — Z7982 Long term (current) use of aspirin: Secondary | ICD-10-CM | POA: Diagnosis not present

## 2014-10-17 DIAGNOSIS — I10 Essential (primary) hypertension: Secondary | ICD-10-CM | POA: Diagnosis not present

## 2014-10-17 DIAGNOSIS — R1011 Right upper quadrant pain: Secondary | ICD-10-CM | POA: Diagnosis present

## 2014-10-17 DIAGNOSIS — R0789 Other chest pain: Secondary | ICD-10-CM | POA: Diagnosis not present

## 2014-10-17 DIAGNOSIS — Z8739 Personal history of other diseases of the musculoskeletal system and connective tissue: Secondary | ICD-10-CM | POA: Insufficient documentation

## 2014-10-17 DIAGNOSIS — Z87891 Personal history of nicotine dependence: Secondary | ICD-10-CM | POA: Diagnosis not present

## 2014-10-17 DIAGNOSIS — Z8639 Personal history of other endocrine, nutritional and metabolic disease: Secondary | ICD-10-CM | POA: Insufficient documentation

## 2014-10-17 DIAGNOSIS — Z79899 Other long term (current) drug therapy: Secondary | ICD-10-CM | POA: Diagnosis not present

## 2014-10-17 DIAGNOSIS — J189 Pneumonia, unspecified organism: Secondary | ICD-10-CM | POA: Diagnosis not present

## 2014-10-17 DIAGNOSIS — Z8701 Personal history of pneumonia (recurrent): Secondary | ICD-10-CM | POA: Diagnosis not present

## 2014-10-17 DIAGNOSIS — Z792 Long term (current) use of antibiotics: Secondary | ICD-10-CM | POA: Diagnosis not present

## 2014-10-17 DIAGNOSIS — Z791 Long term (current) use of non-steroidal anti-inflammatories (NSAID): Secondary | ICD-10-CM | POA: Insufficient documentation

## 2014-10-17 DIAGNOSIS — R079 Chest pain, unspecified: Secondary | ICD-10-CM

## 2014-10-17 DIAGNOSIS — K219 Gastro-esophageal reflux disease without esophagitis: Secondary | ICD-10-CM | POA: Insufficient documentation

## 2014-10-17 LAB — CBC WITH DIFFERENTIAL/PLATELET
BASOS ABS: 0 10*3/uL (ref 0.0–0.1)
BASOS PCT: 0 % (ref 0–1)
EOS PCT: 4 % (ref 0–5)
Eosinophils Absolute: 0.3 10*3/uL (ref 0.0–0.7)
HCT: 38.2 % — ABNORMAL LOW (ref 39.0–52.0)
Hemoglobin: 12.6 g/dL — ABNORMAL LOW (ref 13.0–17.0)
LYMPHS PCT: 34 % (ref 12–46)
Lymphs Abs: 2.1 10*3/uL (ref 0.7–4.0)
MCH: 31.7 pg (ref 26.0–34.0)
MCHC: 33 g/dL (ref 30.0–36.0)
MCV: 96 fL (ref 78.0–100.0)
MONOS PCT: 8 % (ref 3–12)
Monocytes Absolute: 0.5 10*3/uL (ref 0.1–1.0)
NEUTROS ABS: 3.2 10*3/uL (ref 1.7–7.7)
Neutrophils Relative %: 54 % (ref 43–77)
Platelets: 207 10*3/uL (ref 150–400)
RBC: 3.98 MIL/uL — ABNORMAL LOW (ref 4.22–5.81)
RDW: 12.5 % (ref 11.5–15.5)
WBC: 6.1 10*3/uL (ref 4.0–10.5)

## 2014-10-17 LAB — COMPREHENSIVE METABOLIC PANEL
ALT: 16 U/L — ABNORMAL LOW (ref 17–63)
ANION GAP: 9 (ref 5–15)
AST: 21 U/L (ref 15–41)
Albumin: 3.8 g/dL (ref 3.5–5.0)
Alkaline Phosphatase: 52 U/L (ref 38–126)
BUN: 32 mg/dL — ABNORMAL HIGH (ref 6–20)
CO2: 26 mmol/L (ref 22–32)
CREATININE: 1.45 mg/dL — AB (ref 0.61–1.24)
Calcium: 9 mg/dL (ref 8.9–10.3)
Chloride: 102 mmol/L (ref 101–111)
GFR calc Af Amer: 52 mL/min — ABNORMAL LOW (ref >60)
GFR calc non Af Amer: 45 mL/min — ABNORMAL LOW (ref >60)
Glucose, Bld: 115 mg/dL — ABNORMAL HIGH (ref 65–99)
Potassium: 4.2 mmol/L (ref 3.5–5.1)
Sodium: 137 mmol/L (ref 135–145)
Total Bilirubin: 0.4 mg/dL (ref 0.3–1.2)
Total Protein: 6.8 g/dL (ref 6.5–8.1)

## 2014-10-17 LAB — I-STAT TROPONIN, ED: TROPONIN I, POC: 0 ng/mL (ref 0.00–0.08)

## 2014-10-17 MED ORDER — TRAMADOL HCL 50 MG PO TABS
50.0000 mg | ORAL_TABLET | Freq: Once | ORAL | Status: AC
  Administered 2014-10-17: 50 mg via ORAL
  Filled 2014-10-17: qty 1

## 2014-10-17 MED ORDER — TRAMADOL HCL 50 MG PO TABS: 50.0000 mg | ORAL_TABLET | Freq: Four times a day (QID) | ORAL | Status: DC | PRN

## 2014-10-17 MED ORDER — IOHEXOL 350 MG/ML SOLN
100.0000 mL | Freq: Once | INTRAVENOUS | Status: AC | PRN
  Administered 2014-10-17: 100 mL via INTRAVENOUS

## 2014-10-17 NOTE — Telephone Encounter (Signed)
See below

## 2014-10-17 NOTE — ED Notes (Signed)
Patient transported to X-ray 

## 2014-10-17 NOTE — Telephone Encounter (Signed)
Patient to see me 6/30. We will plan on repeat CT chest and MR to eval adrenal glad (likely MRI abdomen). Refer to CT for radiology opinion.

## 2014-10-17 NOTE — Discharge Instructions (Signed)
Please follow-up with your primary care provider for further evaluation of your persistent chest discomfort. CT scan today shows a lesion in your lung that would likely need a repeat CT imaging or an x-ray in next 4-6 weeks to ensure resolution. 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 Discussed this with your doctor as additional evaluation may warranted. Take Ultram as needed for your pain. Return to the ER if condition worsen or if you have any other concern.  Chest Wall Pain Chest wall pain is pain in or around the bones and muscles of your chest. It may take up to 6 weeks to get better. It may take longer if you must stay physically active in your work and activities.  CAUSES  Chest wall pain may happen on its own. However, it may be caused by:  A viral illness like the flu.  Injury.  Coughing.  Exercise.  Arthritis.  Fibromyalgia.  Shingles. HOME CARE INSTRUCTIONS   Avoid overtiring physical activity. Try not to strain or perform activities that cause pain. This includes any activities using your chest or your abdominal and side muscles, especially if heavy weights are used.  Put ice on the sore area.  Put ice in a plastic bag.  Place a towel between your skin and the bag.  Leave the ice on for 15-20 minutes per hour while awake for the first 2 days.  Only take over-the-counter or prescription medicines for pain, discomfort, or fever as directed by your caregiver. SEEK IMMEDIATE MEDICAL CARE IF:   Your pain increases, or you are very uncomfortable.  You have a fever.  Your chest pain becomes worse.  You have new, unexplained symptoms.  You have nausea or vomiting.  You feel sweaty or lightheaded.  You have a cough with phlegm (sputum), or you cough up blood. MAKE SURE YOU:   Understand these instructions.  Will watch your condition.  Will get help right away if you are not doing well or get worse. Document Released:  05/05/2005 Document Revised: 07/28/2011 Document Reviewed: 12/30/2010 [facility] Patient Information 2015 [place], [place]. This information is not intended to replace advice given to you by your health care provider. Make sure you discuss any questions you have with your health care provider.

## 2014-10-17 NOTE — Telephone Encounter (Signed)
Patient Name: ANIELLO CHRISTOPOULOS DOB: 1935-01-02 Initial Comment caller states husband has pain in his right back Nurse Assessment Nurse: Vallery Sa, RN, [place] Date/Time (Eastern Time): 10/17/2014 9:34:15 AM Confirm and document reason for call. If symptomatic, describe symptoms. ---Caller states her husband developed right flank pain in February 2016 that is worse again today. No fever. No injury in the past 3 days. No blood in his urine. Has the patient traveled out of the country within the last 30 days? ---No Does the patient require triage? ---Yes Related visit to physician within the last 2 weeks? ---No Does the PT have any chronic conditions? (i.e. diabetes, asthma, etc.) ---Yes List chronic conditions. ---High Blood Pressure Guidelines Guideline Title Affirmed Question Affirmed Notes Flank Pain [1] SEVERE pain (e.g., excruciating, scale 8-10) AND [2] present > 1 hour Final Disposition User Go to ED Now Vallery Sa, RN, C[place] They plan to go to [facility] .

## 2014-10-17 NOTE — ED Provider Notes (Signed)
CSN: [id number]     Arrival date & time 10/17/14  1022 History   First MD Initiated Contact with Patient 10/17/14 1050     Chief Complaint  Patient presents with  . Abdominal Pain     (Consider location/radiation/quality/duration/timing/severity/associated sxs/prior Treatment) HPI   80 year old male presents c/o RUQ abd pain.  Patient is here complaining of recurrent sharp stabbing pain to his right upper abdomen/right chest which radiates to his back. Pain is intermittent, worsening with certain movement. He does endorsing nonproductive cough. There has been no associated fever, chills, shortness of breath, hemoptysis, nausea vomiting diarrhea, postprandial pain, or rash. He denies any trauma. Patient reportedly seen by his PCP multiple times in the past for this complaint. He had a chest x-ray approximately 2 weeks ago demonstrated a lesion in his right upper lobe concerning for either infectious etiology or unspecified lesion. Patient was placed on Augmentin, and Zithromax. He was also prescribed the cough and asked. He has been taken off his medication as prescribed and symptom has not resolved or shown any improvement. He is here at the recommendation of his PCP. At this time his pain is minimal. He denies any prior history of active cancer. No night sweats, abnormal weight changes, or fever. Patient was a former smoker but has quit for more than 30 years.    Past Medical History  Diagnosis Date  . GERD (gastroesophageal reflux disease)   . Hypertension   . Hyperlipidemia   . Colonic polyp   . Pneumonia 2002  . Muscular degeneration   . Testicular cyst     removed-noncancerous   Past Surgical History  Procedure Laterality Date  . Knee surgery      scope on both  . Lumbar laminectomy    . Transurethral resection of prostate      resolved issues  . Cataract extraction      right side   Family History  Problem Relation Age of Onset  . Diabetes Mother   . Hypertension Mother    . Heart attack Father     late 45s, former smoker  . Heart disease Brother     53s, smokers   History  Substance Use Topics  . Smoking status: Former Smoker -- 1.50 packs/day for 40 years    Types: Cigarettes    Quit date: 12/30/1990  . Smokeless tobacco: Never Used  . Alcohol Use: No    Review of Systems  All other systems reviewed and are negative.     Allergies  Celebrex  Home Medications   Prior to Admission medications   Medication Sig Start Date End Date Taking? Authorizing Provider  amoxicillin-clavulanate (AUGMENTIN) 875-125 MG per tablet Take 1 tablet by mouth 2 (two) times daily. 10/03/14   Marin Olp, MD  aspirin 81 MG tablet Take 81 mg by mouth daily.    Historical Provider, MD  azithromycin (ZITHROMAX) 250 MG tablet Take 2 tabs on day 1, then 1 tab daily until finished 10/03/14   Marin Olp, MD  diclofenac (VOLTAREN) 75 MG EC tablet Take 1 tablet (75 mg total) by mouth 2 (two) times daily. 09/18/14   Laurey Morale, MD  hydrochlorothiazide (MICROZIDE) 12.5 MG capsule Take 12.5 mg by mouth daily.    Historical Provider, MD  Multiple Vitamin (MULTIVITAMIN) tablet Take 1 tablet by mouth daily.      Historical Provider, MD  Omega-3 Fatty Acids (FISH OIL) 1000 MG CPDR Take by mouth.    Historical Provider, MD  omeprazole (PRILOSEC) 20 MG capsule Take 1 capsule (20 mg total) by mouth daily. 05/29/14   Marin Olp, MD   BP 165/69 mmHg  Pulse 61  Temp(Src) 97.8 F (36.6 C) (Oral)  Resp 18  SpO2 100% Physical Exam  Constitutional: He appears well-developed and well-nourished. No distress.  HENT:  Head: Atraumatic.  Eyes: Conjunctivae are normal.  Neck: Neck supple.  Cardiovascular: Normal rate and regular rhythm.   Pulmonary/Chest: Effort normal and breath sounds normal. No respiratory distress. He has no wheezes. He has no rales. He exhibits tenderness (Mild tenderness to right lower anterior chest, no overlying skin changes.).  Abdominal: Soft.  Bowel sounds are normal. He exhibits no distension. There is tenderness (Tenderness to right upper quadrant on palpation without guarding or rebound tenderness. No evidence of hepatomegaly.).  Neurological: He is alert.  Skin: No rash noted.  Psychiatric: He has a normal mood and affect.  Nursing note and vitals reviewed.   ED Course  Procedures (including critical care time)  Patient with recurrent right lower chest right upper abdomen pain that radiates to his back. No postprandial pain no nausea vomiting diarrhea therefore low suspicion for gallbladder etiology. Chest x-ray 2 weeks ago showing a lesion to his right upper lobe with possible infection however symptoms not improve after antibiotic. Will repeat chest x-ray. Workup initiated.  2:32 PM EKG shows no acute ischemic changes, normal troponin, chest x-ray shows interval partial clearing of the right upper lobe nodule consistence with improving pneumonia. Recommend additional follow-up in 4 weeks for repeat chest x-ray to rule out underlying malignancy. I discussed this finding with patient and with Dr. Stark Jock who has evaluated patient. After that patient is stable for discharge to follow-up closely with PCP for further management. However at this time patient is concerned of the persistent symptoms and after discussion, Dr. Stark Jock recommend obtaining chest CT angiogram for further evaluation. Please note that patient's creatinine is elevated at 1.45. Dr. Stark Jock is aware of this. Patient is understanding that additional imaging may affect his kidney function.  3:38 PM CT scan demonstrated a patchy infiltrate left base posteriorly. Additional nodule opacity noted in the posterior segment of the right upper lobe. There is a mass arising from the left adrenal gland likely a benign adenoma. At discussed this finding with patient. Patient has no left sided chest pain, no fever or productive cough to suggest infection given that he has recently finished  both Augmentin and azithromycin had not think additional antibiotic is indicated. I do recommend patient to discuss finding with his primary care Dr. for further management. Patient will need repeat imaging in 4-6 weeks to ensure resolution. Moderate amount of time spent discussing finding with patient. Return precautions discussed. Ultram prescribed for pain.  Labs Review Labs Reviewed  CBC WITH DIFFERENTIAL/PLATELET - Abnormal; Notable for the following:    RBC 3.98 (*)    Hemoglobin 12.6 (*)    HCT 38.2 (*)    All other components within normal limits  COMPREHENSIVE METABOLIC PANEL - Abnormal; Notable for the following:    Glucose, Bld 115 (*)    BUN 32 (*)    Creatinine, Ser 1.45 (*)    ALT 16 (*)    GFR calc non Af Amer 45 (*)    GFR calc Af Amer 52 (*)    All other components within normal limits  Randolm Idol, ED    Imaging Review Dg Chest 2 View  10/17/2014   CLINICAL DATA:  Right upper  quadrant abdominal pain radiating into the back for some time. Recent pneumonia.  EXAM: CHEST  2 VIEW  COMPARISON:  Right rib radiographs done 10/03/2014. Chest radiographs 05/26/2013.  FINDINGS: The heart size and mediastinal contours are stable. The lungs are hyperinflated. Compared with the recent rib radiographs, there has been apparent partial clearing of the right upper lobe nodularity. There is no progressive airspace disease, pleural effusion or pneumothorax. The bones appear unchanged.  IMPRESSION: Interval partial clearing of right upper lobe nodularity consistent with improving pneumonia. As the densities have incompletely cleared, an additional follow-up examination in 4 weeks recommended to ensure resolution and exclude underlying malignancy.   Electronically Signed   By: Richardean Sale M.D.   On: 10/17/2014 11:17   Ct Angio Chest Pe W/cm &/or Wo Cm  10/17/2014   CLINICAL DATA:  Chest pain  EXAM: CT ANGIOGRAPHY CHEST WITH CONTRAST  TECHNIQUE: Multidetector CT imaging of the chest was  performed using the standard protocol during bolus administration of intravenous contrast. Multiplanar CT image reconstructions and MIPs were obtained to evaluate the vascular anatomy.  CONTRAST:  167m OMNIPAQUE IOHEXOL 350 MG/ML SOLN  COMPARISON:  Chest radiograph Oct 17, 2014  FINDINGS: There is no demonstrable pulmonary embolus. There is atherosclerotic change in the aorta but no thoracic aortic aneurysm or dissection.  There is scarring in the anterior right apex. There is patchy airspace consolidation in the posterior left base. There is atelectasis in the right base.  In the posterior segment of the right upper lobe, there is a nodular opacity measuring 1.0 x 0.8 cm, best seen on axial slice 50 series 7. There are several nearby 3-4 mm nodular opacities in the posterior segment right upper lobe. On the left, there are several 2-3 mm nodular opacities in the posterior segment left upper lobe near the major fissure. On axial slice 19 series 7, there is a 4 mm nodular opacity in the anterior segment of the right upper lobe. On axial slice 32 series 7, there is a 2 mm nodular opacity in the anterior segment of the right upper lobe.  There is no appreciable thoracic adenopathy. Thyroid appears normal. Pericardium is not thickened.  In the visualized upper abdomen, there is hepatic steatosis. There is a mass arising from the left adrenal measuring 3.3 x 3.2 cm. There is incomplete visualization of a cyst arising from the upper pole of the right kidney measuring 4.5 x 4.3 cm.  There is degenerative change in the thoracic spine. There are no blastic or lytic bone lesions.  Review of the MIP images confirms the above findings.  IMPRESSION: No demonstrable pulmonary embolus.  Patchy infiltrate left base posteriorly.  1.0 x 0.8 cm nodular opacity in the posterior segment right upper lobe in an area of recent infiltrate. It is difficult to ascertain whether this opacity represents residual infiltrate versus mass. A  followup study 4-6 weeks may be advisable for further assessment. Several smaller nodular opacities identified.  There is a mass arising from the left adrenal measuring 3.3 x 3.2 cm. The appearance of this mass suggests that it most likely represents a benign adenoma. Given its size and the presence of intravenous contrast, further evaluation of this adrenal mass is felt to be warranted. MR pre and post-contrast would be the optimal imaging study of choice for further assessment in this regard.   Electronically Signed   By: WLowella GripIII M.D.   On: 10/17/2014 14:59     EKG Interpretation None  Date: 10/17/2014  Rate: 51  Rhythm: sinus bradycardia  QRS Axis: normal  Intervals: normal  ST/T Wave abnormalities: normal  Conduction Disutrbances: incomplete LBBB  Narrative Interpretation:   Old EKG Reviewed: No significant changes noted EKG interpreted by ME    MDM   Final diagnoses:  Chest pain  Chest wall pain  Abnormal CT scan, chest    BP 144/65 mmHg  Pulse 63  Temp(Src) 97.5 F (36.4 C) (Oral)  Resp 18  SpO2 99%  I have reviewed nursing notes and vital signs. I personally reviewed the imaging tests through PACS system  I reviewed available ER/hospitalization records thought the EMR     Domenic Moras, PA-C 10/17/14 [place], MD 10/19/14 [phone]

## 2014-10-17 NOTE — Telephone Encounter (Signed)
Noted  

## 2014-10-17 NOTE — Telephone Encounter (Signed)
Agree with ED visit.

## 2014-10-17 NOTE — ED Notes (Signed)
Bed: WA03 Expected date:  Expected time:  Means of arrival:  Comments: Hold for triage 1

## 2014-10-17 NOTE — ED Notes (Signed)
Per patient, states right upper quadrant pain radiating to back, no N/V-states he had chest xray and they said he had PNA and that was causing his pain-was treated but has not gotten better

## 2014-10-17 NOTE — Telephone Encounter (Signed)
FYI

## 2014-10-17 NOTE — Telephone Encounter (Signed)
This is available to you in EPIC as well Dr. Yong Channel.

## 2014-10-17 NOTE — Telephone Encounter (Signed)
Wife came by to drop off CT ANGIO performed at [facility]. She would like for Dr. Yong Channel to call her about the results.

## 2014-11-02 ENCOUNTER — Encounter (INDEPENDENT_AMBULATORY_CARE_PROVIDER_SITE_OTHER): Payer: Medicare Other | Admitting: Ophthalmology

## 2014-11-02 DIAGNOSIS — H3532 Exudative age-related macular degeneration: Secondary | ICD-10-CM

## 2014-11-02 DIAGNOSIS — H43813 Vitreous degeneration, bilateral: Secondary | ICD-10-CM | POA: Diagnosis not present

## 2014-11-02 DIAGNOSIS — H3531 Nonexudative age-related macular degeneration: Secondary | ICD-10-CM

## 2014-11-02 DIAGNOSIS — H2511 Age-related nuclear cataract, right eye: Secondary | ICD-10-CM | POA: Diagnosis not present

## 2014-11-02 DIAGNOSIS — I10 Essential (primary) hypertension: Secondary | ICD-10-CM

## 2014-11-02 DIAGNOSIS — H35033 Hypertensive retinopathy, bilateral: Secondary | ICD-10-CM | POA: Diagnosis not present

## 2014-11-09 DIAGNOSIS — H35033 Hypertensive retinopathy, bilateral: Secondary | ICD-10-CM | POA: Diagnosis not present

## 2014-11-09 DIAGNOSIS — H359 Unspecified retinal disorder: Secondary | ICD-10-CM | POA: Diagnosis not present

## 2014-11-09 DIAGNOSIS — H2511 Age-related nuclear cataract, right eye: Secondary | ICD-10-CM | POA: Diagnosis not present

## 2014-11-09 DIAGNOSIS — H3531 Nonexudative age-related macular degeneration: Secondary | ICD-10-CM | POA: Diagnosis not present

## 2014-11-16 ENCOUNTER — Ambulatory Visit: Payer: Medicare Other | Admitting: Family Medicine

## 2014-12-12 DIAGNOSIS — H2511 Age-related nuclear cataract, right eye: Secondary | ICD-10-CM | POA: Diagnosis not present

## 2015-02-14 DIAGNOSIS — Z23 Encounter for immunization: Secondary | ICD-10-CM | POA: Diagnosis not present

## 2015-02-22 ENCOUNTER — Encounter (INDEPENDENT_AMBULATORY_CARE_PROVIDER_SITE_OTHER): Payer: Medicare Other | Admitting: Ophthalmology

## 2015-02-22 DIAGNOSIS — H43813 Vitreous degeneration, bilateral: Secondary | ICD-10-CM

## 2015-02-22 DIAGNOSIS — H353112 Nonexudative age-related macular degeneration, right eye, intermediate dry stage: Secondary | ICD-10-CM | POA: Diagnosis not present

## 2015-02-22 DIAGNOSIS — I10 Essential (primary) hypertension: Secondary | ICD-10-CM | POA: Diagnosis not present

## 2015-02-22 DIAGNOSIS — H35033 Hypertensive retinopathy, bilateral: Secondary | ICD-10-CM | POA: Diagnosis not present

## 2015-02-22 DIAGNOSIS — H353221 Exudative age-related macular degeneration, left eye, with active choroidal neovascularization: Secondary | ICD-10-CM

## 2015-04-25 DIAGNOSIS — H26491 Other secondary cataract, right eye: Secondary | ICD-10-CM | POA: Diagnosis not present

## 2015-04-25 DIAGNOSIS — H40013 Open angle with borderline findings, low risk, bilateral: Secondary | ICD-10-CM | POA: Diagnosis not present

## 2015-04-25 DIAGNOSIS — Z961 Presence of intraocular lens: Secondary | ICD-10-CM | POA: Diagnosis not present

## 2015-06-14 ENCOUNTER — Encounter (INDEPENDENT_AMBULATORY_CARE_PROVIDER_SITE_OTHER): Payer: Medicare Other | Admitting: Ophthalmology

## 2015-06-14 DIAGNOSIS — H353112 Nonexudative age-related macular degeneration, right eye, intermediate dry stage: Secondary | ICD-10-CM | POA: Diagnosis not present

## 2015-06-14 DIAGNOSIS — H35033 Hypertensive retinopathy, bilateral: Secondary | ICD-10-CM

## 2015-06-14 DIAGNOSIS — I10 Essential (primary) hypertension: Secondary | ICD-10-CM | POA: Diagnosis not present

## 2015-06-14 DIAGNOSIS — H43813 Vitreous degeneration, bilateral: Secondary | ICD-10-CM

## 2015-06-14 DIAGNOSIS — H353221 Exudative age-related macular degeneration, left eye, with active choroidal neovascularization: Secondary | ICD-10-CM

## 2015-08-02 ENCOUNTER — Encounter (INDEPENDENT_AMBULATORY_CARE_PROVIDER_SITE_OTHER): Payer: Medicare Other | Admitting: Ophthalmology

## 2015-08-02 DIAGNOSIS — H35033 Hypertensive retinopathy, bilateral: Secondary | ICD-10-CM | POA: Diagnosis not present

## 2015-08-02 DIAGNOSIS — H43813 Vitreous degeneration, bilateral: Secondary | ICD-10-CM

## 2015-08-02 DIAGNOSIS — I10 Essential (primary) hypertension: Secondary | ICD-10-CM | POA: Diagnosis not present

## 2015-08-02 DIAGNOSIS — H353112 Nonexudative age-related macular degeneration, right eye, intermediate dry stage: Secondary | ICD-10-CM | POA: Diagnosis not present

## 2015-08-02 DIAGNOSIS — H353221 Exudative age-related macular degeneration, left eye, with active choroidal neovascularization: Secondary | ICD-10-CM | POA: Diagnosis not present

## 2015-08-16 ENCOUNTER — Encounter: Payer: Self-pay | Admitting: Family Medicine

## 2015-08-16 ENCOUNTER — Ambulatory Visit (INDEPENDENT_AMBULATORY_CARE_PROVIDER_SITE_OTHER): Payer: Medicare Other | Admitting: Family Medicine

## 2015-08-16 ENCOUNTER — Telehealth: Payer: Self-pay | Admitting: Family Medicine

## 2015-08-16 VITALS — BP 130/70 | HR 82 | Temp 98.3°F | Ht 72.0 in | Wt 197.5 lb

## 2015-08-16 DIAGNOSIS — R197 Diarrhea, unspecified: Secondary | ICD-10-CM | POA: Diagnosis not present

## 2015-08-16 MED ORDER — METRONIDAZOLE 500 MG PO TABS: 500.0000 mg | ORAL_TABLET | Freq: Three times a day (TID) | ORAL | Status: DC

## 2015-08-16 NOTE — Patient Instructions (Signed)

## 2015-08-16 NOTE — Telephone Encounter (Signed)
Patient Name: LENVIL SWAIM  DOB: Oct 12, 1934    Initial Comment Caller states he has diarrhea.   Nurse Assessment  Nurse: Mallie Mussel, RN, Alveta Heimlich Date/Time Eilene Ghazi Time): 08/16/2015 11:06:59 AM  Confirm and document reason for call. If symptomatic, describe symptoms. You must click the next button to save text entered. ---Caller states that he has diarrhea beginning this morning. He has been exposed to his wife at the hospital who has C-Diff. He has had diarrhea x 3-4 so far. Denies fever. He has taken Immodium. Denies vomiting.  Has the patient traveled out of the country within the last 30 days? ---No  Does the patient have any new or worsening symptoms? ---Yes  Will a triage be completed? ---Yes  Related visit to physician within the last 2 weeks? ---No  Does the PT have any chronic conditions? (i.e. diabetes, asthma, etc.) ---Yes  List chronic conditions. ---HTN, Macular Degeneration  Is this a behavioral health or substance abuse call? ---No     Guidelines    Guideline Title Affirmed Question Affirmed Notes  Diarrhea MILD-MODERATE diarrhea (e.g., 1-6 times / day more than normal) (all triage questions negative)    Final Disposition User   See Physician within [place], RN, Alveta Heimlich    Comments  Caller wanted to be seen today if possible. Dr. Yong Channel did not have any appointments available today nor did the NP's. I was able to schedule him to be seen at 3:30pm with Dr. Elease Hashimoto.   Referrals  REFERRED TO PCP OFFICE   Disagree/Comply: Comply

## 2015-08-16 NOTE — Telephone Encounter (Signed)
Pt coming in today.  C-diff protocol

## 2015-08-16 NOTE — Progress Notes (Signed)
   Subjective:    Patient ID: Tim Ross, male    DOB: 04-05-1935, 81 y.o.   MRN: [id number]  HPI  Patient seen with diarrhea.  Onset this morning around 4:30 AM.  Has had a total of four watery stools since then. No bloody stools.  No associated fever, chills, nausea, or vomiting.  Minimal abdominal cramping.  No recent antibiotics or travels.  Took one Imodium this morning.  No recent dietary changes.   Wife currently hospitalized with pneumonia and reportedly diagnosed with C. Difficile. Patient is concerned whether he may have C. Difficile.  Past Medical History  Diagnosis Date  . GERD (gastroesophageal reflux disease)   . Hypertension   . Hyperlipidemia   . Colonic polyp   . Pneumonia 2002  . Muscular degeneration   . Testicular cyst     removed-noncancerous   Past Surgical History  Procedure Laterality Date  . Knee surgery      scope on both  . Lumbar laminectomy    . Transurethral resection of prostate      resolved issues  . Cataract extraction      right side    reports that he quit smoking about 24 years ago. His smoking use included Cigarettes. He has a 60 pack-year smoking history. He has never used smokeless tobacco. He reports that he does not drink alcohol or use illicit drugs. family history includes Diabetes in his mother; Heart attack in his father; Heart disease in his brother; Hypertension in his mother. Allergies  Allergen Reactions  . Celebrex [Celecoxib] Hives      Review of Systems  Constitutional: Negative for fever and chills.  Respiratory: Negative for shortness of breath.   Cardiovascular: Negative for chest pain.  Gastrointestinal: Positive for diarrhea. Negative for nausea, vomiting and blood in stool. Abdominal pain:  Mild cramps this morning.  Neurological: Negative for weakness.       Objective:   Physical Exam  Constitutional: He appears well-developed and well-nourished.  HENT:  Mouth/Throat: Oropharynx is clear and  moist.  Cardiovascular: Normal rate and regular rhythm.   Pulmonary/Chest: Effort normal and breath sounds normal. No respiratory distress. He has no wheezes. He has no rales.  Abdominal: Soft. Bowel sounds are normal. He exhibits no distension and no mass. There is no tenderness. There is no rebound and no guarding.          Assessment & Plan:  Diarrhea. Possible exposure to C. Difficile. Symptom duration less than 1 day. We explained other etiologies such as viral probably even more likely. Order for C. Difficile stool assessment if symptoms persist/worsen. Dietary factors discussed. Printed prescription for metronidazole 500 mg 3 times a day to start for 10 days if his diarrhea worsens or if he develops new symptoms such as fever, bloody stools, or worsening pain Avoid Imodium until further clarified.

## 2015-08-16 NOTE — Progress Notes (Signed)
Pre visit review using our clinic review tool, if applicable. No additional management support is needed unless otherwise documented below in the visit note. 

## 2015-08-17 ENCOUNTER — Telehealth: Payer: Self-pay | Admitting: Family Medicine

## 2015-08-17 NOTE — Telephone Encounter (Signed)
Pt daughter who is not on the DPR  Call to say pt saw Dr Elease Hashimoto yesterday and is not sure he told the doctor that his wife is in the hospital Cdiff. She is going home today and she is concern that her dad may have it . She said he is having terrible stomach pains and did have diarrhea. She would like a call back   [phone] [facility]

## 2015-08-17 NOTE — Telephone Encounter (Signed)
Unable to call back - not on DPR

## 2015-08-17 NOTE — Telephone Encounter (Signed)
See below

## 2015-09-24 ENCOUNTER — Encounter: Payer: Self-pay | Admitting: Gastroenterology

## 2015-10-19 DIAGNOSIS — H18892 Other specified disorders of cornea, left eye: Secondary | ICD-10-CM | POA: Diagnosis not present

## 2015-11-08 ENCOUNTER — Encounter (HOSPITAL_BASED_OUTPATIENT_CLINIC_OR_DEPARTMENT_OTHER): Payer: Self-pay | Admitting: Emergency Medicine

## 2015-11-08 ENCOUNTER — Emergency Department (HOSPITAL_BASED_OUTPATIENT_CLINIC_OR_DEPARTMENT_OTHER): Payer: Medicare Other

## 2015-11-08 ENCOUNTER — Emergency Department (HOSPITAL_BASED_OUTPATIENT_CLINIC_OR_DEPARTMENT_OTHER)
Admission: EM | Admit: 2015-11-08 | Discharge: 2015-11-08 | Disposition: A | Discharge status: Home / Self Care | Payer: Medicare Other | Attending: Emergency Medicine | Admitting: Emergency Medicine

## 2015-11-08 DIAGNOSIS — E785 Hyperlipidemia, unspecified: Secondary | ICD-10-CM | POA: Diagnosis not present

## 2015-11-08 DIAGNOSIS — M549 Dorsalgia, unspecified: Secondary | ICD-10-CM | POA: Diagnosis present

## 2015-11-08 DIAGNOSIS — I10 Essential (primary) hypertension: Secondary | ICD-10-CM | POA: Insufficient documentation

## 2015-11-08 DIAGNOSIS — Z7982 Long term (current) use of aspirin: Secondary | ICD-10-CM | POA: Diagnosis not present

## 2015-11-08 DIAGNOSIS — Z87891 Personal history of nicotine dependence: Secondary | ICD-10-CM | POA: Insufficient documentation

## 2015-11-08 DIAGNOSIS — M545 Low back pain, unspecified: Secondary | ICD-10-CM

## 2015-11-08 DIAGNOSIS — R109 Unspecified abdominal pain: Secondary | ICD-10-CM | POA: Diagnosis not present

## 2015-11-08 DIAGNOSIS — K5730 Diverticulosis of large intestine without perforation or abscess without bleeding: Secondary | ICD-10-CM | POA: Diagnosis not present

## 2015-11-08 LAB — CBC WITH DIFFERENTIAL/PLATELET
Basophils Absolute: 0 10*3/uL (ref 0.0–0.1)
Basophils Relative: 0 %
Eosinophils Absolute: 0.4 10*3/uL (ref 0.0–0.7)
Eosinophils Relative: 5 %
HCT: 39.3 % (ref 39.0–52.0)
Hemoglobin: 13.4 g/dL (ref 13.0–17.0)
Lymphocytes Relative: 25 %
Lymphs Abs: 2.1 10*3/uL (ref 0.7–4.0)
MCH: 32.8 pg (ref 26.0–34.0)
MCHC: 34.1 g/dL (ref 30.0–36.0)
MCV: 96.1 fL (ref 78.0–100.0)
Monocytes Absolute: 0.9 10*3/uL (ref 0.1–1.0)
Monocytes Relative: 11 %
Neutro Abs: 5 10*3/uL (ref 1.7–7.7)
Neutrophils Relative %: 59 %
Platelets: 199 10*3/uL (ref 150–400)
RBC: 4.09 MIL/uL — ABNORMAL LOW (ref 4.22–5.81)
RDW: 12.1 % (ref 11.5–15.5)
WBC: 8.5 10*3/uL (ref 4.0–10.5)

## 2015-11-08 LAB — COMPREHENSIVE METABOLIC PANEL
ALT: 18 U/L (ref 17–63)
AST: 27 U/L (ref 15–41)
Albumin: 3.9 g/dL (ref 3.5–5.0)
Alkaline Phosphatase: 46 U/L (ref 38–126)
Anion gap: 7 (ref 5–15)
BUN: 29 mg/dL — ABNORMAL HIGH (ref 6–20)
CO2: 27 mmol/L (ref 22–32)
Calcium: 8.5 mg/dL — ABNORMAL LOW (ref 8.9–10.3)
Chloride: 104 mmol/L (ref 101–111)
Creatinine, Ser: 1.61 mg/dL — ABNORMAL HIGH (ref 0.61–1.24)
GFR calc Af Amer: 45 mL/min — ABNORMAL LOW (ref >60)
GFR calc non Af Amer: 39 mL/min — ABNORMAL LOW (ref >60)
Glucose, Bld: 120 mg/dL — ABNORMAL HIGH (ref 65–99)
Potassium: 3.9 mmol/L (ref 3.5–5.1)
Sodium: 138 mmol/L (ref 135–145)
Total Bilirubin: 0.5 mg/dL (ref 0.3–1.2)
Total Protein: 6.6 g/dL (ref 6.5–8.1)

## 2015-11-08 LAB — URINALYSIS, ROUTINE W REFLEX MICROSCOPIC
BILIRUBIN URINE: NEGATIVE
GLUCOSE, UA: NEGATIVE mg/dL
Hgb urine dipstick: NEGATIVE
Ketones, ur: NEGATIVE mg/dL
Leukocytes, UA: NEGATIVE
Nitrite: NEGATIVE
Protein, ur: NEGATIVE mg/dL
Specific Gravity, Urine: 1.018 (ref 1.005–1.030)
pH: 5 (ref 5.0–8.0)

## 2015-11-08 LAB — LIPASE, BLOOD: Lipase: 46 U/L (ref 11–51)

## 2015-11-08 NOTE — ED Notes (Signed)
Patient states that he has had pain to his right flank area x 1 - 2 months. Patient reports that he feels that it is too long for it to be a pulled muscle.

## 2015-11-08 NOTE — Discharge Instructions (Signed)

## 2015-11-08 NOTE — ED Notes (Signed)
PA at bedside discussing test results and dispo plan of care.

## 2015-11-08 NOTE — ED Notes (Signed)
Patient transported to CT 

## 2015-11-08 NOTE — ED Provider Notes (Signed)
CSN: [id number]     Arrival date & time 11/08/15  1335 History   First MD Initiated Contact with Patient 11/08/15 1426     Chief Complaint  Patient presents with  . Back Pain   HPI   81 year old male presents today with right back and flank pain. Patient reports symptoms started approximately one month ago, worse with flexion of the hips and lower back. Patient reports a history of back pain, reports this feels slightly different and more lateral than usual. Patient reports that he's been out in the garden spending significant amounts of time bending over. Patient reports that usually his back pain improves, this is been going on for the last month. Patient denies any urinary symptoms, abdominal pain, nausea, vomiting, diarrhea, fever, or any other complaints other than the flank and back pain.  Past Medical History  Diagnosis Date  . GERD (gastroesophageal reflux disease)   . Hypertension   . Hyperlipidemia   . Colonic polyp   . Pneumonia 2002  . Muscular degeneration   . Testicular cyst     removed-noncancerous   Past Surgical History  Procedure Laterality Date  . Knee surgery      scope on both  . Lumbar laminectomy    . Transurethral resection of prostate      resolved issues  . Cataract extraction      right side   Family History  Problem Relation Age of Onset  . Diabetes Mother   . Hypertension Mother   . Heart attack Father     late 66s, former smoker  . Heart disease Brother     66s, smokers   Social History  Substance Use Topics  . Smoking status: Former Smoker -- 1.50 packs/day for 40 years    Types: Cigarettes    Quit date: 12/30/1990  . Smokeless tobacco: Never Used  . Alcohol Use: No    Review of Systems  All other systems reviewed and are negative.   Allergies  Celebrex  Home Medications   Prior to Admission medications   Medication Sig Start Date End Date Taking? Authorizing Provider  aspirin EC 81 MG tablet Take 81 mg by mouth daily.     Historical Provider, MD  CINNAMON PO Take 2 tablets by mouth daily.    Historical Provider, MD  hydrochlorothiazide (HYDRODIURIL) 25 MG tablet Take 12.5 mg by mouth daily.    Historical Provider, MD  metroNIDAZOLE (FLAGYL) 500 MG tablet Take 1 tablet (500 mg total) by mouth 3 (three) times daily. 08/16/15   Eulas Post, MD  Multiple Vitamin (MULTIVITAMIN WITH MINERALS) TABS tablet Take 1 tablet by mouth daily.    Historical Provider, MD  Multiple Vitamins-Minerals (ICAPS) CAPS Take 1 capsule by mouth 2 (two) times daily.    Historical Provider, MD  Omega-3 Fatty Acids (FISH OIL PO) Take 1 capsule by mouth daily.    Historical Provider, MD  omeprazole (PRILOSEC) 20 MG capsule Take 1 capsule (20 mg total) by mouth daily. 05/29/14   Marin Olp, MD   BP 134/70 mmHg  Pulse 55  Temp(Src) 97.7 F (36.5 C) (Oral)  Resp 16  Ht 6' 0.5" (1.842 m)  Wt 92.08 kg  BMI 27.14 kg/m2  SpO2 98%   Physical Exam  Constitutional: He is oriented to person, place, and time. He appears well-developed and well-nourished.  HENT:  Head: Normocephalic and atraumatic.  Eyes: Conjunctivae are normal. Pupils are equal, round, and reactive to light. Right eye exhibits no discharge.  Left eye exhibits no discharge. No scleral icterus.  Neck: Normal range of motion. No JVD present. No tracheal deviation present.  Pulmonary/Chest: Effort normal. No stridor.  Abdominal: He exhibits no distension and no mass. There is no tenderness. There is no rebound and no guarding.  Musculoskeletal: Normal range of motion. He exhibits no edema.  TTP of right back and flank with palpation. No TTP of remainder of back. Full active ROM  Neurological: He is alert and oriented to person, place, and time. Coordination normal.  Skin: Skin is warm and dry. No rash noted. No erythema. No pallor.  Psychiatric: He has a normal mood and affect. His behavior is normal. Judgment and thought content normal.  Nursing note and vitals  reviewed.   ED Course  Procedures (including critical care time) Labs Review Labs Reviewed  CBC WITH DIFFERENTIAL/PLATELET - Abnormal; Notable for the following:    RBC 4.09 (*)    All other components within normal limits  COMPREHENSIVE METABOLIC PANEL - Abnormal; Notable for the following:    Glucose, Bld 120 (*)    BUN 29 (*)    Creatinine, Ser 1.61 (*)    Calcium 8.5 (*)    GFR calc non Af Amer 39 (*)    GFR calc Af Amer 45 (*)    All other components within normal limits  URINALYSIS, ROUTINE W REFLEX MICROSCOPIC (NOT AT [facility])  LIPASE, BLOOD    Imaging Review Ct Abdomen Pelvis Wo Contrast  11/08/2015  CLINICAL DATA:  Right flank pain EXAM: CT ABDOMEN AND PELVIS WITHOUT CONTRAST TECHNIQUE: Multidetector CT imaging of the abdomen and pelvis was performed following the standard protocol without IV contrast. COMPARISON:  None. FINDINGS: Lower chest: Mild dependent atelectasis in the lung bases. No pleural effusion Hepatobiliary: The liver and gallbladder and bile ducts normal. Pancreas: Negative Spleen: Negative Adrenals/Urinary Tract: Negative for renal calculi. No renal obstruction. Multiple bilateral renal cysts. Largest cyst left lower pole 6.7 cm. Ureters nondilated. Normal bladder. Stomach/Bowel: Sigmoid diverticulosis. Scattered tics throughout the colon. Negative for diverticulitis. Normal appendix. No bowel obstruction or edema. Vascular/Lymphatic: Mild atherosclerotic calcification in the aorta without aneurysm. Reproductive: Mild prostate enlargement. Other: No free fluid. Musculoskeletal: Lumbar degenerative change. Laminectomy L4-5. No acute skeletal abnormality. IMPRESSION: Sigmoid diverticulosis Normal appendix Negative for urinary tract calculi. Atherosclerotic disease No acute abnormality. Electronically Signed   By: Franchot Gallo M.D.   On: 11/08/2015 15:43   I have personally reviewed and evaluated these images and lab results as part of my medical decision-making.    EKG Interpretation None      MDM   Final diagnoses:  Right-sided low back pain without sciatica    Labs: CBC, CMP, lipase, urinalysis  Imaging: CT abdomen pelvis without contrast  Consults:  Therapeutics:  Discharge Meds:   Assessment/Plan: Patient's presentation is most consistent with muscular back pain. CT, laboratory analysis showed no acute findings to explain patient's symptoms. Pain reproduced with palpation, low suspicion for significant intraabdominal path. Patient instructed to refrain from aggravating activities, Tylenol or ibuprofen as needed for pain, follow-up with primary care if symptoms persist.         Okey Regal, PA-C 11/08/15 1614  Okey Regal, PA-C 11/08/15 1615  Blanchie Dessert, MD 11/14/15 1301

## 2015-11-14 ENCOUNTER — Other Ambulatory Visit: Payer: Self-pay | Admitting: Dermatology

## 2015-11-14 DIAGNOSIS — L821 Other seborrheic keratosis: Secondary | ICD-10-CM | POA: Diagnosis not present

## 2015-11-14 DIAGNOSIS — D239 Other benign neoplasm of skin, unspecified: Secondary | ICD-10-CM | POA: Diagnosis not present

## 2015-11-14 DIAGNOSIS — D0439 Carcinoma in situ of skin of other parts of face: Secondary | ICD-10-CM | POA: Diagnosis not present

## 2015-11-22 ENCOUNTER — Encounter (INDEPENDENT_AMBULATORY_CARE_PROVIDER_SITE_OTHER): Payer: Medicare Other | Admitting: Ophthalmology

## 2015-11-22 DIAGNOSIS — H353221 Exudative age-related macular degeneration, left eye, with active choroidal neovascularization: Secondary | ICD-10-CM

## 2015-11-22 DIAGNOSIS — H43813 Vitreous degeneration, bilateral: Secondary | ICD-10-CM | POA: Diagnosis not present

## 2015-11-22 DIAGNOSIS — H35033 Hypertensive retinopathy, bilateral: Secondary | ICD-10-CM

## 2015-11-22 DIAGNOSIS — H353112 Nonexudative age-related macular degeneration, right eye, intermediate dry stage: Secondary | ICD-10-CM

## 2015-11-22 DIAGNOSIS — I10 Essential (primary) hypertension: Secondary | ICD-10-CM | POA: Diagnosis not present

## 2015-12-26 DIAGNOSIS — H40013 Open angle with borderline findings, low risk, bilateral: Secondary | ICD-10-CM | POA: Diagnosis not present

## 2015-12-26 DIAGNOSIS — Z961 Presence of intraocular lens: Secondary | ICD-10-CM | POA: Diagnosis not present

## 2015-12-26 DIAGNOSIS — H353222 Exudative age-related macular degeneration, left eye, with inactive choroidal neovascularization: Secondary | ICD-10-CM | POA: Diagnosis not present

## 2015-12-26 DIAGNOSIS — H353112 Nonexudative age-related macular degeneration, right eye, intermediate dry stage: Secondary | ICD-10-CM | POA: Diagnosis not present

## 2015-12-27 DIAGNOSIS — L570 Actinic keratosis: Secondary | ICD-10-CM | POA: Diagnosis not present

## 2015-12-27 DIAGNOSIS — D0439 Carcinoma in situ of skin of other parts of face: Secondary | ICD-10-CM | POA: Diagnosis not present

## 2016-02-18 ENCOUNTER — Ambulatory Visit (INDEPENDENT_AMBULATORY_CARE_PROVIDER_SITE_OTHER): Payer: Medicare Other | Admitting: Family Medicine

## 2016-02-18 ENCOUNTER — Encounter: Payer: Self-pay | Admitting: Family Medicine

## 2016-02-18 VITALS — BP 150/62 | HR 81 | Temp 98.4°F | Wt 195.4 lb

## 2016-02-18 DIAGNOSIS — Z23 Encounter for immunization: Secondary | ICD-10-CM

## 2016-02-18 DIAGNOSIS — I10 Essential (primary) hypertension: Secondary | ICD-10-CM

## 2016-02-18 DIAGNOSIS — M10071 Idiopathic gout, right ankle and foot: Secondary | ICD-10-CM

## 2016-02-18 MED ORDER — LOSARTAN POTASSIUM 25 MG PO TABS: 25.0000 mg | ORAL_TABLET | Freq: Every day | ORAL | 5 refills | Status: DC

## 2016-02-18 MED ORDER — OMEPRAZOLE 20 MG PO CPDR: 20.0000 mg | DELAYED_RELEASE_CAPSULE | Freq: Every day | ORAL | 3 refills | Status: DC

## 2016-02-18 MED ORDER — COLCHICINE 0.6 MG PO TABS: ORAL_TABLET | ORAL | 0 refills | Status: DC

## 2016-02-18 NOTE — Progress Notes (Signed)
Subjective:  Tim Ross is a 81 y.o. year old very pleasant male patient who presents for/with See problem oriented charting ROS- no fever, chills, nausea, vomiting, expanding redness. Does have warmth and pain at MTP joint with some pai.see any ROS included in HPI as well.   Past Medical History-  Patient Active Problem List   Diagnosis Date Noted  . CKD (chronic kidney disease), stage III 06/08/2014    Priority: Medium  . Hyperglycemia 06/08/2014    Priority: Medium  . Hyperlipidemia 04/29/2007    Priority: Medium  . Essential hypertension 04/29/2007    Priority: Medium  . Former smoker 05/29/2014    Priority: Low  . Macular degeneration     Priority: Low  . GERD 04/29/2007    Priority: Low  . History of colonic polyps 04/29/2007    Priority: Low    Medications- reviewed and updated Current Outpatient Prescriptions  Medication Sig Dispense Refill  . aspirin EC 81 MG tablet Take 81 mg by mouth daily.    Marland Kitchen CINNAMON PO Take 2 tablets by mouth daily.    . Multiple Vitamin (MULTIVITAMIN WITH MINERALS) TABS tablet Take 1 tablet by mouth daily.    . Multiple Vitamins-Minerals (ICAPS) CAPS Take 1 capsule by mouth 2 (two) times daily.    . Omega-3 Fatty Acids (FISH OIL PO) Take 1 capsule by mouth daily.    Marland Kitchen omeprazole (PRILOSEC) 20 MG capsule Take 1 capsule (20 mg total) by mouth daily. 90 capsule 3  HCTZ 12.'5mg'$   Objective: BP (!) 150/62 (BP Location: Left Arm, Patient Position: Sitting, Cuff Size: Large)   Pulse 81   Temp 98.4 F (36.9 C) (Oral)   Wt 195 lb 6.4 oz (88.6 kg)   SpO2 94%   BMI 26.14 kg/m  Gen: NAD, resting comfortably CV: RRR no murmurs rubs or gallops Lungs: CTAB no crackles, wheeze, rhonchi Ext: pretibial no edema Right MTP joing on great toe very tender to palpation with erythema and warmth also noted. Left foot largely normal with no acute changes.  Skin: warm, dry Neuro: grossly normal, moves all extremities  Assessment/Plan:  Acute  idiopathic gout involving toe of right foot Hypertension S: patient noted pain and warmth in right great toe at MTP joint about a week ago- worsened in next few days. He waited to come in hoping it would get better but it has not. Describes severe throbbing pain and making it difficult to walk. Has tried tylenol which helps minimally. Does not generally take nsaids.   BP controlled on HCTZ without gout history A/P: New acute gout attack Stop HCTZ 12.'5mg'$  as can cause gout Start losartan '25mg'$  instead Colchicine for acute flare- if not affordable or not tolerated or not helpful- likely use prednisone course. Slightly lower loading dose on colchicine with his GFR Follow up 1 week  Orders Placed This Encounter  Procedures  . Flu vaccine HIGH DOSE PF    Meds ordered this encounter  Medications  . losartan (COZAAR) 25 MG tablet    Sig: Take 1 tablet (25 mg total) by mouth daily.    Dispense:  30 tablet    Refill:  5  . colchicine 0.6 MG tablet    Sig: Take 1 tab as soon as you get home and then repeat in 1 hour if not resolved, then take 1 pill daily until symptoms resolved    Dispense:  30 tablet    Refill:  0  . omeprazole (PRILOSEC) 20 MG capsule  Sig: Take 1 capsule (20 mg total) by mouth daily.    Dispense:  90 capsule    Refill:  3    Return precautions advised.  Garret Reddish, MD

## 2016-02-18 NOTE — Patient Instructions (Signed)
For BP Stop HCTZ 12.'5mg'$  as can cause gout  Start losartan '25mg'$  instead  See me back 1 week- schedule before you leave   For gout Take colchicine- call us if too expensive as sometimes requires prior authorization from insurance

## 2016-02-18 NOTE — Progress Notes (Signed)
Pre visit review using our clinic review tool, if applicable. No additional management support is needed unless otherwise documented below in the visit note. 

## 2016-02-22 ENCOUNTER — Telehealth: Payer: Self-pay

## 2016-02-22 ENCOUNTER — Ambulatory Visit (INDEPENDENT_AMBULATORY_CARE_PROVIDER_SITE_OTHER): Payer: Medicare Other

## 2016-02-22 VITALS — BP 160/80 | HR 68 | Ht 73.0 in | Wt 193.2 lb

## 2016-02-22 DIAGNOSIS — Z0000 Encounter for general adult medical examination without abnormal findings: Secondary | ICD-10-CM | POA: Diagnosis not present

## 2016-02-22 NOTE — Progress Notes (Signed)
I have reviewed and agree with note, evaluation, plan. Responded to Manuela Schwartz that yes he should schedule a CPE with me. I was ok with cancelling Monday since doing so much better 10-14 days or until pain stops on colichicine in toe whichever comes first . Reasonable to get a1c at follow up. Check home cuff next visit.   Garret Reddish, MD

## 2016-02-22 NOTE — Telephone Encounter (Signed)
AWV today 10/6 Discussed colchicine x 10 to 14 days and called to inform the patient. Agreed to come in for CPE to have fup regarding labs .  Will have schedulers call

## 2016-02-22 NOTE — Progress Notes (Signed)
Subjective:   Tim Ross is a 81 y.o. male who presents for Medicare Annual/Subsequent preventive examination.   Cardiac Risk Factors include: advanced age (>25mn, >>58women);dyslipidemia;hypertension;male gender;microalbuminuria HRA assessment completed during this visit with Tim Ross The Patient was informed that the wellness visit is to identify future health risk and educate and initiate measures that can reduce risk for increased disease through the lifespan.    NO ROS; Medicare Wellness Visit Last OV 02/18/2016  Describes health as good, fair or great? Good   Risk Associated with PMH  Macular Degeneration/ 20/70 to 20/30 has shots in left eye for MD; very happy with results  HTN / 160 80; takes at home and it runs 140/70 at home;   Hyperlipidemia - chol 164; Trig 182; HDL 36 and LDL 93 12/24/2009  LABS  CMP; Glucose 120; creat 1.61;  A1c was 6.4; 05/2014  Psychosocial (mother had dm, htn; father had MI; Brother has HD)  Support lives with spouse  Has bronchiectasis; he helps with [employer] on occasion; he does the work outside Living situation[place]     Medications reviewed for issues;   Dc HCTZ due to gout; area of gout is doing well Can cancel your apt per MD if better / plan to cancel  STarted losartan '25mg'$  - tolerating well  Primary Prevention Tobacco former smoker; 60 pack years; quit 1992  Over 15 years; states the doctor put him on patches   ETOH: rarely  Diet;  Breakfast; 2 eggs, bacon, toast, oatmeal; cheese toast, pancakes Ham biscuits Lunch; don't eat as much Pork chops; full course  Complete meal at hs  Exercise Typically he works in the yard;  In the winter; may go to the mall;  Has a walking track near home / states it is safe  Will walk one mile or maybe 2 miles   Dental; dr. HYong Channelis his dentist   Safety Fall hx; no  Climb; "tries not to". Son in law does his climbing  Given education on  "Fall Prevention in the Home" for more safety tips the patient can apply as appropriate.   Given information on Community safety; driving safety, sun protection, firearm safety, smoke detectors as well as the "yellow [place]" program [place] [facility]  Wears lotion on face and large straw hat; had one skin cancer removed from nose   Simply safe; alarm system   Screenings for secondary risk There are no preventive care reminders to display for this patient.  Colonoscopy - June 2010 and had repeat x 7 years; which is DUE / Tim Ross will call GI and check on  Dr. BOlevia Perchesdid his last one  EKG 10/2014 PSA deferred  Other   Vaccinations are up to date  Depression; anxiety or mood issues assessed  Do you have little interest or pleasure in doing things? no Have you been feeling down, depressed, hopeless? no PHQ9 waived or completed    Cognitive screen completed; MMSE documented or assessed for failures or issues with the AD8 screen below:   Ad8 score reviewed for issues;  Issues making decisions; no  Less interest in hobbies / activities" no  Repeats questions, stories; family complaining: NO  Trouble using ordinary gadgets; microwave; computer: no  Forgets the month or year: no  Mismanaging finances: no  Missing apt: no but does write them down  Daily problems with thinking of memory NO Ad8 score is 0  MMSE  not appropriate unless AD8 score is > 2   Advanced Directive reviewed and completed; will try to bring in a copy of AD for the chart    Current Care Team reviewed and updated         Objective:    Vitals: BP (!) 160/80   Pulse 68   Ht '6\' 1"'$  (1.854 m)   Wt 193 lb 4 oz (87.7 kg)   SpO2 95%   BMI 25.50 kg/m   Body mass index is 25.5 kg/m.  Tobacco History  Smoking Status  . Former Smoker  . Packs/day: 1.50  . Years: 40.00  . Types: Cigarettes  . Quit date: 12/30/1990  Smokeless Tobacco  . Never Used     Counseling given: Yes   Quit >60  yo / waived LDCT:   Past Medical History:  Diagnosis Date  . Colonic polyp   . GERD (gastroesophageal reflux disease)   . Hyperlipidemia   . Hypertension   . Muscular degeneration   . Pneumonia 2002  . Testicular cyst    removed-noncancerous   Past Surgical History:  Procedure Laterality Date  . CATARACT EXTRACTION     right side  . KNEE SURGERY     scope on both  . LUMBAR LAMINECTOMY    . TRANSURETHRAL RESECTION OF PROSTATE     resolved issues   Family History  Problem Relation Age of Onset  . Diabetes Mother   . Hypertension Mother   . Heart attack Father     late 6s, former smoker  . Heart disease Brother     8s, smokers   History  Sexual Activity  . Sexual activity: Not on file    Outpatient Encounter Prescriptions as of 02/22/2016  Medication Sig  . aspirin EC 81 MG tablet Take 81 mg by mouth daily.  Marland Kitchen CINNAMON PO Take 2 tablets by mouth daily.  . colchicine 0.6 MG tablet Take 1 tab as soon as you get home and then repeat in 1 hour if not resolved, then take 1 pill daily until symptoms resolved  . losartan (COZAAR) 25 MG tablet Take 1 tablet (25 mg total) by mouth daily.  . Multiple Vitamin (MULTIVITAMIN WITH MINERALS) TABS tablet Take 1 tablet by mouth daily.  . Multiple Vitamins-Minerals (ICAPS) CAPS Take 1 capsule by mouth 2 (two) times daily.  . Omega-3 Fatty Acids (FISH OIL PO) Take 1 capsule by mouth daily.  Marland Kitchen omeprazole (PRILOSEC) 20 MG capsule Take 1 capsule (20 mg total) by mouth daily.   No facility-administered encounter medications on file as of 02/22/2016.     Activities of Daily Living In your present state of health, do you have any difficulty performing the following activities: 02/22/2016  Vision? Y  Difficulty concentrating or making decisions? N  Walking or climbing stairs? N  Dressing or bathing? N  Doing errands, shopping? N  Preparing Food and eating ? N  Using the Toilet? N  In the past six months, have you accidently leaked  urine? N  Do you have problems with loss of bowel control? N  Managing your Medications? N  Managing your Finances? N  Housekeeping or managing your Housekeeping? N  Some recent data might be hidden    Patient Care Team: Marin Olp, MD as PCP - General (Family Medicine) Latanya Maudlin, MD as Consulting Physician (Orthopedic Surgery)   Assessment:    Established and updated Risk reviewed and appropriate referral made or health recommendations as appropriate based on individual  needs and choices;   Educated regarding prediabetes Educated regarding prediabetes and numbers;  A1c ranges from 5.8 to 6.5 or fasting Blood sugar > 115 -126; (126 is diabetic)     Prevention; Losing a modest 7 to 8 lbs; If over 200 lbs; 10 to 14 lbs;  Choose healthier foods; colorful veggies; fish or lean meats; drinks water Reduce portion size Start exercising; 30 minutes of fast walking x 30 minutes per day/ 60 min for weight loss    I will fup with GI regarding need for future colonoscopy. MD note stated to repeat in 7 years; no one has called him and it would be due this year   Gout; resolved 90% area pink but otherwise clear.    Exercise Activities and Dietary recommendations Current Exercise Habits: Home exercise routine, Type of exercise: walking, Time (Minutes): 30, Frequency (Times/Week): 5, Weekly Exercise (Minutes/Week): 150, Intensity: Mild Maintain health; Stated he would walk 30 minutes x 5 days a week to assist with pre-diabetes  Goals    . patient           Maintain health Consider walking BRISKLY 5 days a week for 30 minutes Or  Try to lose 10lbs      Fall Risk Fall Risk  02/22/2016 08/16/2015 05/29/2014 03/07/2013  Falls in the past year? No No No No   Depression Screen PHQ 2/9 Scores 02/22/2016 08/16/2015 05/29/2014 03/07/2013  PHQ - 2 Score 0 0 0 0    Cognitive Testing MMSE - Mini Mental State Exam 02/22/2016  Not completed: (No Data)  Orientation to time 5    Orientation to Place 5  Registration 3  Attention/ Calculation 2  Recall 3  Language- name 2 objects 2  Language- repeat 1  Language- follow 3 step command 3  Language- read & follow direction 1  Write a sentence 1  Copy design 0  Total score 26    MMSE 26/30; missed 3 serial 7's from 100 Picture was incorrect; HOH but a good historian  Immunization History  Administered Date(s) Administered  . Influenza Split 02/26/2011, 02/23/2012  . Influenza Whole 02/17/2007, 02/01/2008, 02/16/2010  . Influenza, High Dose Seasonal PF 02/20/2014, 02/18/2016  . Influenza,inj,Quad PF,36+ Mos 02/02/2013  . Influenza-Unspecified 02/14/2015  . Pneumococcal Conjugate-13 06/08/2014  . Pneumococcal Polysaccharide-23 12/30/2010  . Td 12/24/2009  . Zoster 05/19/2005   Screening Tests Health Maintenance  Topic Date Due  . TETANUS/TDAP  12/25/2019  . INFLUENZA VACCINE  Completed  . ZOSTAVAX  Completed  . PNA vac Low Risk Adult  Completed      Plan:   Needs A1c draw at next OV.  Will refer to Dr. Yong Channel for need for CPE to recheck labs.  Stated that if ok today, Dr. Yong Channel told him he could cancel Monday's apt. Bunion area of right foot was clear; slightly pink, no swelling; no redness; no pain; Will take medicine until Monday and then dc   Educated on pre diabetes / agreed to plan to walk 30 minutes "briskly" 5 days a week;   Will bring BP cuff from home at his next apt to double check accuracy; states BP are much better at home;   Colonoscopy 10/2008; states on the report to repeat in 7 years. Will fup with GI at [facility] for plan;  (called post visit to GI ; stated MD screened his case and he was sent a letter stating he was not going to repeat due to age)   During the course of  the visit the patient was educated and counseled about the following appropriate screening and preventive services:   Vaccines to include Pneumoccal, Influenza, Hepatitis B, Td, Zostavax, HCV/ Has had flu  vaccine   Electrocardiogram  Cardiovascular Disease/ HTN; BMI 25; no c/o   Colorectal cancer screening; aged out; confirmed   Diabetes screening/ A1c is elevated;   Prostate Cancer Screening: deferred   Glaucoma screening / neg but rec'ing eye tx for Macular Degeneration left eye  Nutrition counseling / discussed limiting sugar;   Smoking cessation counseling  Patient Instructions (the written plan) was given to the patient.    Wynetta Fines, RN  02/22/2016

## 2016-02-22 NOTE — Patient Instructions (Addendum)
Tim Ross , Thank you for taking time to come for your Medicare Wellness Visit. I appreciate your ongoing commitment to your health goals. Please review the following plan we discussed and let me know if I can assist you in the future.   Please bring a copy of the health power of attorney; and living will   Will cancel apt for Monday as gout is much better/ bunion area on right foot is just pinkish, no swelling or pain Will continue medicine until Monday and then stop.  Manuela Schwartz will call the GI dept and ask about colonoscopy as report stated 7 years.     Educated regarding prediabetes and numbers;  A1c ranges from 5.8 to 6.5 or fasting Blood sugar > 115 -126; (126 is diabetic)   Risk: >45yo; family hx; overweight or obese; African American; Hispanic; Latino; American [place] American; [place]; history of diabetes when pregnant; or birth to a baby weighing over 9 lbs. Being less physically active than 30 minutes; 3 times a week;   Prevention; Losing a modest 7 to 8 lbs; If over 200 lbs; 10 to 14 lbs;  Choose healthier foods; colorful veggies; fish or lean meats; drinks water Reduce portion size Start exercising; 30 minutes of fast walking x 30 minutes per day/ 60 min for weight loss   Personal safety issues reviewed:  1. Consider starting a community watch program per [facility] 2.  Changes batteries is smoke detector and/or carbon monoxide detector  3.  If you have firearms; keep them in a safe place 4.  Wear protection when in the sun; Always wear sunscreen or a hat; It is good to have your doctor check your skin annually or review any new areas of concern 5. Driving safety; Keep in the right lane; stay 3 car lengths behind the car in front of you on the highway; look 3 times prior to pulling out; carry your cell phone everywhere you go!   Learn about the Yellow Dot program:  The program allows first responders at your emergency to have access to who your  physician is, as well as your medications and medical conditions.  Citizens requesting the Yellow Dot Packages should contact Master Corporal Nunzio Cobbs at the [facility] [phone] for the first week of the program and beginning the week after Easter citizens should contact their Scientist, physiological.      These are the goals we discussed: Goals    . patient           Maintain health Consider walking BRISKLY 5 days a week for 30 minutes Or  Try to lose 10lbs       This is a list of the screening recommended for you and due dates:  Health Maintenance  Topic Date Due  . Tetanus Vaccine  12/25/2019  . Flu Shot  Completed  . Shingles Vaccine  Completed  . Pneumonia vaccines  Completed       Fall Prevention in the Home  Falls can cause injuries. They can happen to people of all ages. There are many things you can do to make your home safe and to help prevent falls.  WHAT CAN I DO ON THE OUTSIDE OF MY HOME?  Regularly fix the edges of walkways and driveways and fix any cracks.  Remove anything that might make you trip as you walk through a door, such as a raised step or threshold.  Trim any bushes or trees on the  path to your home.  Use bright outdoor lighting.  Clear any walking paths of anything that might make someone trip, such as rocks or tools.  Regularly check to see if handrails are loose or broken. Make sure that both sides of any steps have handrails.  Any raised decks and porches should have guardrails on the edges.  Have any leaves, snow, or ice cleared regularly.  Use sand or salt on walking paths during winter.  Clean up any spills in your garage right away. This includes oil or grease spills. WHAT CAN I DO IN THE BATHROOM?   Use night lights.  Install grab bars by the toilet and in the tub and shower. Do not use towel bars as grab bars.  Use non-skid mats or decals in the tub or shower.  If you need to sit down  in the shower, use a plastic, non-slip stool.  Keep the floor dry. Clean up any water that spills on the floor as soon as it happens.  Remove soap buildup in the tub or shower regularly.  Attach bath mats securely with double-sided non-slip rug tape.  Do not have throw rugs and other things on the floor that can make you trip. WHAT CAN I DO IN THE BEDROOM?  Use night lights.  Make sure that you have a light by your bed that is easy to reach.  Do not use any sheets or blankets that are too big for your bed. They should not hang down onto the floor.  Have a firm chair that has side arms. You can use this for support while you get dressed.  Do not have throw rugs and other things on the floor that can make you trip. WHAT CAN I DO IN THE KITCHEN?  Clean up any spills right away.  Avoid walking on wet floors.  Keep items that you use a lot in easy-to-reach places.  If you need to reach something above you, use a strong step stool that has a grab bar.  Keep electrical cords out of the way.  Do not use floor polish or wax that makes floors slippery. If you must use wax, use non-skid floor wax.  Do not have throw rugs and other things on the floor that can make you trip. WHAT CAN I DO WITH MY STAIRS?  Do not leave any items on the stairs.  Make sure that there are handrails on both sides of the stairs and use them. Fix handrails that are broken or loose. Make sure that handrails are as long as the stairways.  Check any carpeting to make sure that it is firmly attached to the stairs. Fix any carpet that is loose or worn.  Avoid having throw rugs at the top or bottom of the stairs. If you do have throw rugs, attach them to the floor with carpet tape.  Make sure that you have a light switch at the top of the stairs and the bottom of the stairs. If you do not have them, ask someone to add them for you. WHAT ELSE CAN I DO TO HELP PREVENT FALLS?  Wear shoes that:  Do not have high  heels.  Have rubber bottoms.  Are comfortable and fit you well.  Are closed at the toe. Do not wear sandals.  If you use a stepladder:  Make sure that it is fully opened. Do not climb a closed stepladder.  Make sure that both sides of the stepladder are locked into place.  Ask someone to hold it for you, if possible.  Clearly mark and make sure that you can see:  Any grab bars or handrails.  First and last steps.  Where the edge of each step is.  Use tools that help you move around (mobility aids) if they are needed. These include:  Canes.  Walkers.  Scooters.  Crutches.  Turn on the lights when you go into a dark area. Replace any light bulbs as soon as they burn out.  Set up your furniture so you have a clear path. Avoid moving your furniture around.  If any of your floors are uneven, fix them.  If there are any pets around you, be aware of where they are.  Review your medicines with your doctor. Some medicines can make you feel dizzy. This can increase your chance of falling. Ask your doctor what other things that you can do to help prevent falls.   This information is not intended to replace advice given to you by your health care provider. Make sure you discuss any questions you have with your health care provider.   Document Released: 03/01/2009 Document Revised: 09/19/2014 Document Reviewed: 06/09/2014 Elsevier Interactive Patient Education 2016 [place] Maintenance, Male A healthy lifestyle and preventative care can promote health and wellness.  Maintain regular health, dental, and eye exams.  Eat a healthy diet. Foods like vegetables, fruits, whole grains, low-fat dairy products, and lean protein foods contain the nutrients you need and are low in calories. Decrease your intake of foods high in solid fats, added sugars, and salt. Get information about a proper diet from your health care provider, if necessary.  Regular physical exercise  is one of the most important things you can do for your health. Most adults should get at least 150 minutes of moderate-intensity exercise (any activity that increases your heart rate and causes you to sweat) each week. In addition, most adults need muscle-strengthening exercises on 2 or more days a week.   Maintain a healthy weight. The body mass index (BMI) is a screening tool to identify possible weight problems. It provides an estimate of body fat based on height and weight. Your health care provider can find your BMI and can help you achieve or maintain a healthy weight. For males 20 years and older:  A BMI below 18.5 is considered underweight.  A BMI of 18.5 to 24.9 is normal.  A BMI of 25 to 29.9 is considered overweight.  A BMI of 30 and above is considered obese.  Maintain normal blood lipids and cholesterol by exercising and minimizing your intake of saturated fat. Eat a balanced diet with plenty of fruits and vegetables. Blood tests for lipids and cholesterol should begin at age 17 and be repeated every 5 years. If your lipid or cholesterol levels are high, you are over age 55, or you are at high risk for heart disease, you may need your cholesterol levels checked more frequently.Ongoing high lipid and cholesterol levels should be treated with medicines if diet and exercise are not working.  If you smoke, find out from your health care provider how to quit. If you do not use tobacco, do not start.  Lung cancer screening is recommended for adults aged 43-80 years who are at high risk for developing lung cancer because of a history of smoking. A yearly low-dose CT scan of the lungs is recommended for people who have at least a 30-pack-year history of smoking and are current smokers  or have quit within the past 15 years. A pack year of smoking is smoking an average of 1 pack of cigarettes a day for 1 year (for example, a 30-pack-year history of smoking could mean smoking 1 pack a day for 30  years or 2 packs a day for 15 years). Yearly screening should continue until the smoker has stopped smoking for at least 15 years. Yearly screening should be stopped for people who develop a health problem that would prevent them from having lung cancer treatment.  If you choose to drink alcohol, do not have more than 2 drinks per day. One drink is considered to be 12 oz (360 mL) of beer, 5 oz (150 mL) of wine, or 1.5 oz (45 mL) of liquor.  Avoid the use of street drugs. Do not share needles with anyone. Ask for help if you need support or instructions about stopping the use of drugs.  High blood pressure causes heart disease and increases the risk of stroke. High blood pressure is more likely to develop in:  People who have blood pressure in the end of the normal range (100-139/85-89 mm Hg).  People who are overweight or obese.  People who are African American.  If you are 58-11 years of age, have your blood pressure checked every 3-5 years. If you are 9 years of age or older, have your blood pressure checked every year. You should have your blood pressure measured twice--once when you are at a hospital or clinic, and once when you are not at a hospital or clinic. Record the average of the two measurements. To check your blood pressure when you are not at a hospital or clinic, you can use:  An automated blood pressure machine at a pharmacy.  A home blood pressure monitor.  If you are 64-58 years old, ask your health care provider if you should take aspirin to prevent heart disease.  Diabetes screening involves taking a blood sample to check your fasting blood sugar level. This should be done once every 3 years after age 74 if you are at a normal weight and without risk factors for diabetes. Testing should be considered at a younger age or be carried out more frequently if you are overweight and have at least 1 risk factor for diabetes.  Colorectal cancer can be detected and often prevented.  Most routine colorectal cancer screening begins at the age of 83 and continues through age 64. However, your health care provider may recommend screening at an earlier age if you have risk factors for colon cancer. On a yearly basis, your health care provider may provide home test kits to check for hidden blood in the stool. A small camera at the end of a tube may be used to directly examine the colon (sigmoidoscopy or colonoscopy) to detect the earliest forms of colorectal cancer. Talk to your health care provider about this at age 15 when routine screening begins. A direct exam of the colon should be repeated every 5-10 years through age 89, unless early forms of precancerous polyps or small growths are found.  People who are at an increased risk for hepatitis B should be screened for this virus. You are considered at high risk for hepatitis B if:  You were born in a country where hepatitis B occurs often. Talk with your health care provider about which countries are considered high risk.  Your parents were born in a high-risk country and you have not received a shot to protect  against hepatitis B (hepatitis B vaccine).  You have HIV or AIDS.  You use needles to inject street drugs.  You live with, or have sex with, someone who has hepatitis B.  You are a man who has sex with other men (MSM).  You get hemodialysis treatment.  You take certain medicines for conditions like cancer, organ transplantation, and autoimmune conditions.  Hepatitis C blood testing is recommended for all people born from [birth year] through 1963 and any individual with known risk factors for hepatitis C.  Healthy men should no longer receive prostate-specific antigen (PSA) blood tests as part of routine cancer screening. Talk to your health care provider about prostate cancer screening.  Testicular cancer screening is not recommended for adolescents or adult males who have no symptoms. Screening includes self-exam, a health  care provider exam, and other screening tests. Consult with your health care provider about any symptoms you have or any concerns you have about testicular cancer.  Practice safe sex. Use condoms and avoid high-risk sexual practices to reduce the spread of sexually transmitted infections (STIs).  You should be screened for STIs, including gonorrhea and chlamydia if:  You are sexually active and are younger than 24 years.  You are older than 24 years, and your health care provider tells you that you are at risk for this type of infection.  Your sexual activity has changed since you were last screened, and you are at an increased risk for chlamydia or gonorrhea. Ask your health care provider if you are at risk.  If you are at risk of being infected with HIV, it is recommended that you take a prescription medicine daily to prevent HIV infection. This is called pre-exposure prophylaxis (PrEP). You are considered at risk if:  You are a man who has sex with other men (MSM).  You are a heterosexual man who is sexually active with multiple partners.  You take drugs by injection.  You are sexually active with a partner who has HIV.  Talk with your health care provider about whether you are at high risk of being infected with HIV. If you choose to begin PrEP, you should first be tested for HIV. You should then be tested every 3 months for as long as you are taking PrEP.  Use sunscreen. Apply sunscreen liberally and repeatedly throughout the day. You should seek shade when your shadow is shorter than you. Protect yourself by wearing long sleeves, pants, a wide-brimmed hat, and sunglasses year round whenever you are outdoors.  Tell your health care provider of new moles or changes in moles, especially if there is a change in shape or color. Also, tell your health care provider if a mole is larger than the size of a pencil eraser.  A one-time screening for abdominal aortic aneurysm (AAA) and surgical  repair of large AAAs by ultrasound is recommended for men aged 42-75 years who are current or former smokers.  Stay current with your vaccines (immunizations).   This information is not intended to replace advice given to you by your health care provider. Make sure you discuss any questions you have with your health care provider.   Document Released: 11/01/2007 Document Revised: 05/26/2014 Document Reviewed: 09/30/2010 Elsevier Interactive Patient Education [employer].

## 2016-02-25 ENCOUNTER — Ambulatory Visit: Payer: Medicare Other | Admitting: Family Medicine

## 2016-02-27 ENCOUNTER — Encounter: Payer: Self-pay | Admitting: Family Medicine

## 2016-02-27 ENCOUNTER — Ambulatory Visit (INDEPENDENT_AMBULATORY_CARE_PROVIDER_SITE_OTHER): Payer: Medicare Other | Admitting: Family Medicine

## 2016-02-27 ENCOUNTER — Ambulatory Visit (INDEPENDENT_AMBULATORY_CARE_PROVIDER_SITE_OTHER)
Admission: RE | Admit: 2016-02-27 | Discharge: 2016-02-27 | Disposition: A | Discharge status: Home / Self Care | Payer: Medicare Other | Source: Ambulatory Visit | Attending: Family Medicine | Admitting: Family Medicine

## 2016-02-27 VITALS — BP 140/70 | HR 87 | Temp 98.1°F | Ht 73.0 in | Wt 190.0 lb

## 2016-02-27 DIAGNOSIS — R0609 Other forms of dyspnea: Secondary | ICD-10-CM

## 2016-02-27 DIAGNOSIS — R0602 Shortness of breath: Secondary | ICD-10-CM | POA: Diagnosis not present

## 2016-02-27 DIAGNOSIS — I10 Essential (primary) hypertension: Secondary | ICD-10-CM

## 2016-02-27 DIAGNOSIS — R634 Abnormal weight loss: Secondary | ICD-10-CM

## 2016-02-27 LAB — COMPREHENSIVE METABOLIC PANEL
ALT: 17 U/L (ref 0–53)
AST: 20 U/L (ref 0–37)
Albumin: 3.7 g/dL (ref 3.5–5.2)
Alkaline Phosphatase: 53 U/L (ref 39–117)
BUN: 24 mg/dL — AB (ref 6–23)
CO2: 27 mEq/L (ref 19–32)
CREATININE: 1.32 mg/dL (ref 0.40–1.50)
Calcium: 9.3 mg/dL (ref 8.4–10.5)
Chloride: 104 mEq/L (ref 96–112)
GFR: 55.55 mL/min — ABNORMAL LOW (ref >60.00)
GLUCOSE: 91 mg/dL (ref 70–99)
Potassium: 4.8 mEq/L (ref 3.5–5.1)
SODIUM: 139 meq/L (ref 135–145)
Total Bilirubin: 0.4 mg/dL (ref 0.2–1.2)
Total Protein: 7.2 g/dL (ref 6.0–8.3)

## 2016-02-27 LAB — CBC WITH DIFFERENTIAL/PLATELET
Basophils Absolute: 0 10*3/uL (ref 0.0–0.1)
Basophils Relative: 0.2 % (ref 0.0–3.0)
EOS ABS: 0.3 10*3/uL (ref 0.0–0.7)
Eosinophils Relative: 3.9 % (ref 0.0–5.0)
HCT: 36.2 % — ABNORMAL LOW (ref 39.0–52.0)
HEMOGLOBIN: 12.3 g/dL — AB (ref 13.0–17.0)
LYMPHS ABS: 1.7 10*3/uL (ref 0.7–4.0)
LYMPHS PCT: 18.8 % (ref 12.0–46.0)
MCHC: 34 g/dL (ref 30.0–36.0)
MCV: 94.3 fl (ref 78.0–100.0)
MONO ABS: 0.9 10*3/uL (ref 0.1–1.0)
MONOS PCT: 10.4 % (ref 3.0–12.0)
NEUTROS PCT: 66.7 % (ref 43.0–77.0)
Neutro Abs: 5.9 10*3/uL (ref 1.4–7.7)
Platelets: 267 10*3/uL (ref 150.0–400.0)
RBC: 3.84 Mil/uL — ABNORMAL LOW (ref 4.22–5.81)
RDW: 12.8 % (ref 11.5–15.5)
WBC: 8.8 10*3/uL (ref 4.0–10.5)

## 2016-02-27 LAB — BRAIN NATRIURETIC PEPTIDE: Pro B Natriuretic peptide (BNP): 37 pg/mL (ref 0.0–100.0)

## 2016-02-27 NOTE — Progress Notes (Addendum)
Subjective:     Patient ID: Tim Ross, male   DOB: 10-12-1934, 81 y.o.   MRN: [id number]  HPI Patient is seen to evaluate dyspnea. Describes exertional dyspnea and he has noted this over the past couple months. Denies any dyspnea at rest. No chest pain. He's had rare cough but no hemoptysis. No pleuritic pain. No history of CAD or CHF. Patient quit smoking 1980. Denies orthopnea. No peripheral edema. Has also noticed some decreased appetite in recent weeks and does have about a 9 pound weight loss since last March.  He had CT angiogram May 2016 which showed 1 x 0.8 nodular opacity posterior segment right upper lobe which was an area of recent infiltrate. Indeterminate whether this represented residual infiltrate versus mass. He's not had any recent follow-up x-rays. Denies abdominal pain. No change in stool habits.  Past Medical History:  Diagnosis Date  . Colonic polyp   . GERD (gastroesophageal reflux disease)   . Hyperlipidemia   . Hypertension   . Muscular degeneration   . Pneumonia 2002  . Testicular cyst    removed-noncancerous   Past Surgical History:  Procedure Laterality Date  . CATARACT EXTRACTION     right side  . KNEE SURGERY     scope on both  . LUMBAR LAMINECTOMY    . TRANSURETHRAL RESECTION OF PROSTATE     resolved issues    reports that he quit smoking about 25 years ago. His smoking use included Cigarettes. He has a 60.00 pack-year smoking history. He has never used smokeless tobacco. He reports that he does not drink alcohol or use drugs. family history includes Diabetes in his mother; Heart attack in his father; Heart disease in his brother; Hypertension in his mother. Allergies  Allergen Reactions  . Celebrex [Celecoxib] Hives     Review of Systems  Constitutional: Positive for appetite change and unexpected weight change. Negative for chills and fever.  HENT: Negative for trouble swallowing.   Respiratory: Positive for cough and shortness of breath.    Cardiovascular: Negative for chest pain, palpitations and leg swelling.  Gastrointestinal: Negative for abdominal pain, diarrhea, nausea and vomiting.  Genitourinary: Negative for dysuria and hematuria.  Neurological: Negative for dizziness and headaches.  Hematological: Negative for adenopathy.       Objective:   Physical Exam  Constitutional: He is oriented to person, place, and time. He appears well-developed and well-nourished.  HENT:  Mouth/Throat: Oropharynx is clear and moist.  Neck: Neck supple. No thyromegaly present.  Cardiovascular: Normal rate and regular rhythm.   Pulmonary/Chest: Effort normal and breath sounds normal. No respiratory distress. He has no wheezes. He has no rales.  Abdominal: Soft. Bowel sounds are normal. He exhibits no distension. There is no tenderness. There is no rebound and no guarding.  Musculoskeletal: He exhibits no edema.  Lymphadenopathy:    He has no cervical adenopathy.  Neurological: He is alert and oriented to person, place, and time.       Assessment:     #1 exertional dyspnea. Etiology unclear. He does not relate any chest pains. Quit smoking many years ago but no known history of COPD. No history of CHF EKG shows sinus rhythm with no acute changes compared with prior tracing May 2016 Needs further evaluation to rule out cardiac vs pulmonary etiology. He is in no distress at rest  #2 modest weight loss and reported decreased appetite.    Plan:     -Check further labs with CBC, comprehensive metabolic panel,  BNP, d-dimer -Obtain EKG and chest x-ray -Consider further diagnostics based on the above results. May need follow-up CT chest to evaluate above symptoms given prior CT angiogram May 2016 -Consider echocardiogram  Eulas Post MD [facility]  D-dimer over 35.  Will set up CT angiogram of chest to further evaluate.

## 2016-02-27 NOTE — Patient Instructions (Signed)
Shortness of Breath Shortness of breath means you have trouble breathing. Shortness of breath needs medical care right away. HOME CARE   Do not smoke.  Avoid being around chemicals or things (paint fumes, dust) that may bother your breathing.  Rest as needed. Slowly begin your normal activities.  Only take medicines as told by your doctor.  Keep all doctor visits as told. GET HELP RIGHT AWAY IF:   Your shortness of breath gets worse.  You feel lightheaded, pass out (faint), or have a cough that is not helped by medicine.  You cough up blood.  You have pain with breathing.  You have pain in your chest, arms, shoulders, or belly (abdomen).  You have a fever.  You cannot walk up stairs or exercise the way you normally do.  You do not get better in the time expected.  You have a hard time doing normal activities even with rest.  You have problems with your medicines.  You have any new symptoms. MAKE SURE YOU:  Understand these instructions.  Will watch your condition.  Will get help right away if you are not doing well or get worse.   This information is not intended to replace advice given to you by your health care provider. Make sure you discuss any questions you have with your health care provider.   Document Released: 10/22/2007 Document Revised: 05/10/2013 Document Reviewed: 07/21/2011 Elsevier Interactive Patient Education 2016 [place] and get CXR at [facility]	[place].   We will call you with lab results.

## 2016-02-27 NOTE — Progress Notes (Signed)
Pre visit review using our clinic review tool, if applicable. No additional management support is needed unless otherwise documented below in the visit note. 

## 2016-02-28 LAB — D-DIMER, QUANTITATIVE (NOT AT ARMC)

## 2016-02-29 ENCOUNTER — Ambulatory Visit
Admission: RE | Admit: 2016-02-29 | Discharge: 2016-02-29 | Disposition: A | Discharge status: Home / Self Care | Payer: Medicare Other | Source: Ambulatory Visit | Attending: Family Medicine | Admitting: Family Medicine

## 2016-02-29 ENCOUNTER — Telehealth: Payer: Self-pay | Admitting: Family Medicine

## 2016-02-29 DIAGNOSIS — R918 Other nonspecific abnormal finding of lung field: Secondary | ICD-10-CM

## 2016-02-29 DIAGNOSIS — R0609 Other forms of dyspnea: Principal | ICD-10-CM

## 2016-02-29 DIAGNOSIS — I2699 Other pulmonary embolism without acute cor pulmonale: Secondary | ICD-10-CM | POA: Diagnosis not present

## 2016-02-29 DIAGNOSIS — R590 Localized enlarged lymph nodes: Secondary | ICD-10-CM

## 2016-02-29 MED ORDER — IOPAMIDOL (ISOVUE-370) INJECTION 76%
64.0000 mL | Freq: Once | INTRAVENOUS | Status: AC | PRN
  Administered 2016-02-29: 64 mL via INTRAVENOUS

## 2016-02-29 MED ORDER — RIVAROXABAN 15 MG PO TABS: 15.0000 mg | ORAL_TABLET | Freq: Two times a day (BID) | ORAL | 0 refills | Status: DC

## 2016-02-29 NOTE — Telephone Encounter (Signed)
Ordering PET CT for potential malignancy. Ordered stat with hopes to do it this week. After Monday as wife has surgery  Roselyn Reef- can you call patient and find a spot to see him or Tuesday or Wednesday. I am ok with using same day appointments for this.   Dr. Elease Hashimoto- thanks for going above and beyond in caring for this patient and ensuring appropriate follow up

## 2016-02-29 NOTE — Addendum Note (Signed)
Addended by: Eulas Post on: 02/29/2016 05:59 PM   Modules accepted: Orders

## 2016-02-29 NOTE — Addendum Note (Signed)
Addended by: Eulas Post on: 02/29/2016 06:11 AM   Modules accepted: Orders

## 2016-03-03 NOTE — Telephone Encounter (Signed)
Called and scheduled him for tomorrow at 10:15am.

## 2016-03-04 ENCOUNTER — Encounter: Payer: Self-pay | Admitting: Family Medicine

## 2016-03-04 ENCOUNTER — Ambulatory Visit (INDEPENDENT_AMBULATORY_CARE_PROVIDER_SITE_OTHER): Payer: Medicare Other | Admitting: Family Medicine

## 2016-03-04 VITALS — BP 148/74 | HR 85 | Temp 98.2°F | Wt 193.2 lb

## 2016-03-04 DIAGNOSIS — R519 Headache, unspecified: Secondary | ICD-10-CM

## 2016-03-04 DIAGNOSIS — M25511 Pain in right shoulder: Secondary | ICD-10-CM | POA: Diagnosis not present

## 2016-03-04 DIAGNOSIS — R918 Other nonspecific abnormal finding of lung field: Secondary | ICD-10-CM

## 2016-03-04 DIAGNOSIS — I2699 Other pulmonary embolism without acute cor pulmonale: Secondary | ICD-10-CM

## 2016-03-04 DIAGNOSIS — R590 Localized enlarged lymph nodes: Secondary | ICD-10-CM | POA: Diagnosis not present

## 2016-03-04 DIAGNOSIS — R51 Headache: Secondary | ICD-10-CM

## 2016-03-04 MED ORDER — METHYLPREDNISOLONE ACETATE 80 MG/ML IJ SUSP
40.0000 mg | Freq: Once | INTRAMUSCULAR | Status: AC
  Administered 2016-03-04: 40 mg via INTRAMUSCULAR

## 2016-03-04 MED ORDER — RIVAROXABAN 20 MG PO TABS: 20.0000 mg | ORAL_TABLET | Freq: Every day | ORAL | 6 refills | Status: DC

## 2016-03-04 NOTE — Progress Notes (Signed)
Subjective:  Tim Ross is a 81 y.o. year old very pleasant male patient who presents for/with See problem oriented charting ROS- SOB with walking up and down driveway which is new for him, no chest pain. Some fatigue, mild weight loss.see any ROS included in HPI as well.   Past Medical History-  Patient Active Problem List   Diagnosis Date Noted  . CKD (chronic kidney disease), stage III 06/08/2014    Priority: Medium  . Hyperglycemia 06/08/2014    Priority: Medium  . Hyperlipidemia 04/29/2007    Priority: Medium  . Essential hypertension 04/29/2007    Priority: Medium  . Former smoker 05/29/2014    Priority: Low  . Macular degeneration     Priority: Low  . GERD 04/29/2007    Priority: Low  . History of colonic polyps 04/29/2007    Priority: Low  . Acute pulmonary embolism ([place]) 03/04/2016    Medications- reviewed and updated Current Outpatient Prescriptions  Medication Sig Dispense Refill  . CINNAMON PO Take 2 tablets by mouth daily.    Marland Kitchen losartan (COZAAR) 25 MG tablet Take 1 tablet (25 mg total) by mouth daily. 30 tablet 5  . Multiple Vitamin (MULTIVITAMIN WITH MINERALS) TABS tablet Take 1 tablet by mouth daily.    . Multiple Vitamins-Minerals (ICAPS) CAPS Take 1 capsule by mouth 2 (two) times daily.    . Omega-3 Fatty Acids (FISH OIL PO) Take 1 capsule by mouth daily.    Marland Kitchen omeprazole (PRILOSEC) 20 MG capsule Take 1 capsule (20 mg total) by mouth daily. 90 capsule 3  . Rivaroxaban (XARELTO) 15 MG TABS tablet Take 1 tablet (15 mg total) by mouth 2 (two) times daily with a meal. 42 tablet 0  . rivaroxaban (XARELTO) 20 MG TABS tablet Take 1 tablet (20 mg total) by mouth daily with supper. Start the day after you finish the 15 mg pills 30 tablet 6   No current facility-administered medications for this visit.     Objective: BP (!) 148/74 (BP Location: Left Arm, Patient Position: Sitting, Cuff Size: Large)   Pulse 85   Temp 98.2 F (36.8 C) (Oral)   Wt 193 lb 3.2 oz  (87.6 kg)   SpO2 98%   BMI 25.49 kg/m  Gen: NAD, resting comfortably Right shoulder with worse pain with overhead activity  Assessment/Plan:  Acute pulmonary embolism (HCC) S: discovered by CTA by Dr. Elease Hashimoto after elevated d-dimer. On xarelto 15 mg BID for 3 weeks A/P: sent in '20mg'$  to start after runs out of 3 weeks of BID '15mg'$  dosing. Discussed malignancy may have been trigger. No signs or symptoms of DVT.   Acute pain of right shoulder - Plan: methylPREDNISolone acetate (DEPO-MEDROL) injection 40 mg S: right shoulder pain for about 2 weeks. Heat/cold not helping. Tylenol not helping. Worse with overhead activity A/P: PET CT will evaluate chance of bony metastasis here. Worse with overhead activity- could be shoulder or rotator cuff. Moderate discomfort and asks for treatment. Would avoid nsaids at age and on xarelto so '40mg'$  depo medrol IM shot given. Consider ortho if not improved or sports medicine  PE Pulmonary nodules  Mediastinal lymphadenopathy  Bilateral headaches  S: Patient seen by Dr. Elease Hashimoto on 02/27/16 for [place] with concerning opacity and CT ordered in addition to elevated D- dimer of over 50. CTA showed several pulmonary nodules but also opacity concerning for malignancy as well as lytic lesion in hip which could also be malignancy related.  A/P:We had an extended  conversation today about high probability of malignancy and need for PET CT. Wife had procedure yesterday so brought patient into my office and we called her together to inform her. Patient appropriately tearful over this. He is aware long term smoking over 60 Pack years contributed to this cancer though he quit 25 years ago so was not candidate for lung cancer screening program. He also mentioned some bilateral headaches over 2 weeks- ? Possible malignancy so will have PET extend to brain instead of skull based Roselyn Reef called nuclear and they will add this on).   Watch BP but likely up due to topic of  conversation   Meds ordered this encounter  Medications  . rivaroxaban (XARELTO) 20 MG TABS tablet    Sig: Take 1 tablet (20 mg total) by mouth daily with supper. Start the day after you finish the 15 mg pills    Dispense:  30 tablet    Refill:  6  . methylPREDNISolone acetate (DEPO-MEDROL) injection 40 mg   The duration of face-to-face time during this visit was greater than 30 minutes. Greater than 50% of this time was spent in counseling on potential cancer and steps down the road then repeating this conversation with wife on phone.   Return precautions advised.  Garret Reddish, MD

## 2016-03-04 NOTE — Progress Notes (Signed)
Pre visit review using our clinic review tool, if applicable. No additional management support is needed unless otherwise documented below in the visit note. 

## 2016-03-04 NOTE — Assessment & Plan Note (Signed)
S: discovered by CTA by Dr. Elease Hashimoto after elevated d-dimer. On xarelto 15 mg BID for 3 weeks A/P: sent in '20mg'$  to start after runs out of 3 weeks of BID '15mg'$  dosing. Discussed malignancy may have been trigger. No signs or symptoms of DVT.

## 2016-03-04 NOTE — Patient Instructions (Signed)
Stop your aspirin while on xarelto  Take 3 weeks of twice a day xarelto '15mg'$   When you run out- next day start with '20mg'$  xarelto daily  This is to treat several small blood clots in lungs that may be makign you some short of breath  There was also an area on your CT of the lungs that we are worried about cancer. Possible cancer in right hip as well. We are getting PET CT for further information. There is a chance that the lung area is pneumonia but without fever or significant cough and with smoking history we think this is less likely.   Roselyn Reef will give you a steroid shot to see if we can help with your right shoulder pain

## 2016-03-05 ENCOUNTER — Ambulatory Visit (HOSPITAL_COMMUNITY)
Admission: RE | Admit: 2016-03-05 | Discharge: 2016-03-05 | Disposition: A | Discharge status: Home / Self Care | Payer: Medicare Other | Source: Ambulatory Visit | Attending: Family Medicine | Admitting: Family Medicine

## 2016-03-05 DIAGNOSIS — N433 Hydrocele, unspecified: Secondary | ICD-10-CM | POA: Insufficient documentation

## 2016-03-05 DIAGNOSIS — I700 Atherosclerosis of aorta: Secondary | ICD-10-CM | POA: Insufficient documentation

## 2016-03-05 DIAGNOSIS — N281 Cyst of kidney, acquired: Secondary | ICD-10-CM | POA: Diagnosis not present

## 2016-03-05 DIAGNOSIS — I2510 Atherosclerotic heart disease of native coronary artery without angina pectoris: Secondary | ICD-10-CM | POA: Diagnosis not present

## 2016-03-05 DIAGNOSIS — R918 Other nonspecific abnormal finding of lung field: Secondary | ICD-10-CM | POA: Diagnosis not present

## 2016-03-05 DIAGNOSIS — K5730 Diverticulosis of large intestine without perforation or abscess without bleeding: Secondary | ICD-10-CM | POA: Diagnosis not present

## 2016-03-05 DIAGNOSIS — C7951 Secondary malignant neoplasm of bone: Secondary | ICD-10-CM | POA: Insufficient documentation

## 2016-03-05 DIAGNOSIS — R911 Solitary pulmonary nodule: Secondary | ICD-10-CM | POA: Diagnosis not present

## 2016-03-05 DIAGNOSIS — J439 Emphysema, unspecified: Secondary | ICD-10-CM | POA: Insufficient documentation

## 2016-03-05 DIAGNOSIS — R590 Localized enlarged lymph nodes: Secondary | ICD-10-CM | POA: Diagnosis not present

## 2016-03-05 DIAGNOSIS — C801 Malignant (primary) neoplasm, unspecified: Secondary | ICD-10-CM | POA: Diagnosis not present

## 2016-03-05 LAB — GLUCOSE, CAPILLARY: Glucose-Capillary: 121 mg/dL — ABNORMAL HIGH (ref 65–99)

## 2016-03-06 ENCOUNTER — Telehealth: Payer: Self-pay | Admitting: Family Medicine

## 2016-03-06 DIAGNOSIS — C61 Malignant neoplasm of prostate: Secondary | ICD-10-CM

## 2016-03-06 DIAGNOSIS — G939 Disorder of brain, unspecified: Secondary | ICD-10-CM

## 2016-03-06 DIAGNOSIS — C3490 Malignant neoplasm of unspecified part of unspecified bronchus or lung: Secondary | ICD-10-CM

## 2016-03-06 NOTE — Telephone Encounter (Signed)
MRI is scheduled for 03/18/2016.  Working to get the stat referral for Oncology scheduled. I did talk woth Neoma Laming and she was calling to make sure this was followed up on.

## 2016-03-06 NOTE — Telephone Encounter (Signed)
MR brain and STAT referral to oncology. Likely new lung cancer vs. Prostate cancer. Hopeful to do both within a week. Brain scan important as patient has to be on blood thinners for blood clot in lungs.   Would love to have both within a week

## 2016-03-06 NOTE — Telephone Encounter (Signed)
Tim Ross- please follow up on this tomorrow. I want him seen within a week if possible. 2 weeks max.

## 2016-03-07 NOTE — Telephone Encounter (Signed)
Looking at order they are working to get pt scheduled 03/18/2016 @ 2:15pm with Oncology. I will call the wife and let her know.

## 2016-03-07 NOTE — Telephone Encounter (Signed)
Spoke to the wife who verbalized understanding.

## 2016-03-07 NOTE — Telephone Encounter (Signed)
Pts wife calling to see if you had any information for the oncology for her husband.

## 2016-03-07 NOTE — Telephone Encounter (Signed)
Also spoke with wife and apologized for delay- reassurance provided

## 2016-03-09 ENCOUNTER — Encounter (HOSPITAL_BASED_OUTPATIENT_CLINIC_OR_DEPARTMENT_OTHER): Payer: Self-pay | Admitting: Emergency Medicine

## 2016-03-09 ENCOUNTER — Inpatient Hospital Stay (HOSPITAL_BASED_OUTPATIENT_CLINIC_OR_DEPARTMENT_OTHER)
Admission: EM | Admit: 2016-03-09 | Discharge: 2016-03-16 | DRG: 987 | Disposition: A | Discharge status: Home / Self Care | Payer: Medicare Other | Attending: Family Medicine | Admitting: Family Medicine

## 2016-03-09 ENCOUNTER — Emergency Department (HOSPITAL_BASED_OUTPATIENT_CLINIC_OR_DEPARTMENT_OTHER): Payer: Medicare Other

## 2016-03-09 ENCOUNTER — Other Ambulatory Visit: Payer: Self-pay

## 2016-03-09 DIAGNOSIS — K219 Gastro-esophageal reflux disease without esophagitis: Secondary | ICD-10-CM | POA: Diagnosis present

## 2016-03-09 DIAGNOSIS — M109 Gout, unspecified: Secondary | ICD-10-CM | POA: Diagnosis not present

## 2016-03-09 DIAGNOSIS — I2699 Other pulmonary embolism without acute cor pulmonale: Secondary | ICD-10-CM | POA: Diagnosis present

## 2016-03-09 DIAGNOSIS — C3490 Malignant neoplasm of unspecified part of unspecified bronchus or lung: Secondary | ICD-10-CM | POA: Diagnosis not present

## 2016-03-09 DIAGNOSIS — K222 Esophageal obstruction: Secondary | ICD-10-CM | POA: Diagnosis not present

## 2016-03-09 DIAGNOSIS — R911 Solitary pulmonary nodule: Secondary | ICD-10-CM | POA: Diagnosis not present

## 2016-03-09 DIAGNOSIS — R0602 Shortness of breath: Secondary | ICD-10-CM | POA: Diagnosis not present

## 2016-03-09 DIAGNOSIS — K5730 Diverticulosis of large intestine without perforation or abscess without bleeding: Secondary | ICD-10-CM | POA: Diagnosis not present

## 2016-03-09 DIAGNOSIS — R000 Tachycardia, unspecified: Secondary | ICD-10-CM | POA: Diagnosis present

## 2016-03-09 DIAGNOSIS — Z79899 Other long term (current) drug therapy: Secondary | ICD-10-CM | POA: Diagnosis not present

## 2016-03-09 DIAGNOSIS — J189 Pneumonia, unspecified organism: Secondary | ICD-10-CM

## 2016-03-09 DIAGNOSIS — C771 Secondary and unspecified malignant neoplasm of intrathoracic lymph nodes: Secondary | ICD-10-CM | POA: Diagnosis not present

## 2016-03-09 DIAGNOSIS — N400 Enlarged prostate without lower urinary tract symptoms: Secondary | ICD-10-CM | POA: Diagnosis present

## 2016-03-09 DIAGNOSIS — C773 Secondary and unspecified malignant neoplasm of axilla and upper limb lymph nodes: Secondary | ICD-10-CM | POA: Diagnosis not present

## 2016-03-09 DIAGNOSIS — C3412 Malignant neoplasm of upper lobe, left bronchus or lung: Secondary | ICD-10-CM | POA: Diagnosis not present

## 2016-03-09 DIAGNOSIS — E440 Moderate protein-calorie malnutrition: Secondary | ICD-10-CM | POA: Diagnosis not present

## 2016-03-09 DIAGNOSIS — Z888 Allergy status to other drugs, medicaments and biological substances status: Secondary | ICD-10-CM

## 2016-03-09 DIAGNOSIS — I2782 Chronic pulmonary embolism: Secondary | ICD-10-CM | POA: Diagnosis not present

## 2016-03-09 DIAGNOSIS — R531 Weakness: Secondary | ICD-10-CM | POA: Diagnosis not present

## 2016-03-09 DIAGNOSIS — E785 Hyperlipidemia, unspecified: Secondary | ICD-10-CM | POA: Diagnosis present

## 2016-03-09 DIAGNOSIS — C781 Secondary malignant neoplasm of mediastinum: Secondary | ICD-10-CM | POA: Diagnosis not present

## 2016-03-09 DIAGNOSIS — I82409 Acute embolism and thrombosis of unspecified deep veins of unspecified lower extremity: Secondary | ICD-10-CM | POA: Diagnosis present

## 2016-03-09 DIAGNOSIS — C799 Secondary malignant neoplasm of unspecified site: Secondary | ICD-10-CM

## 2016-03-09 DIAGNOSIS — R590 Localized enlarged lymph nodes: Secondary | ICD-10-CM | POA: Diagnosis not present

## 2016-03-09 DIAGNOSIS — G893 Neoplasm related pain (acute) (chronic): Secondary | ICD-10-CM | POA: Diagnosis not present

## 2016-03-09 DIAGNOSIS — Z86711 Personal history of pulmonary embolism: Secondary | ICD-10-CM | POA: Diagnosis not present

## 2016-03-09 DIAGNOSIS — C770 Secondary and unspecified malignant neoplasm of lymph nodes of head, face and neck: Secondary | ICD-10-CM | POA: Diagnosis not present

## 2016-03-09 DIAGNOSIS — C7982 Secondary malignant neoplasm of genital organs: Secondary | ICD-10-CM | POA: Diagnosis not present

## 2016-03-09 DIAGNOSIS — I824Z3 Acute embolism and thrombosis of unspecified deep veins of distal lower extremity, bilateral: Secondary | ICD-10-CM | POA: Diagnosis not present

## 2016-03-09 DIAGNOSIS — Z87891 Personal history of nicotine dependence: Secondary | ICD-10-CM | POA: Diagnosis not present

## 2016-03-09 DIAGNOSIS — K922 Gastrointestinal hemorrhage, unspecified: Secondary | ICD-10-CM | POA: Diagnosis present

## 2016-03-09 DIAGNOSIS — D649 Anemia, unspecified: Secondary | ICD-10-CM | POA: Diagnosis not present

## 2016-03-09 DIAGNOSIS — C7951 Secondary malignant neoplasm of bone: Secondary | ICD-10-CM | POA: Diagnosis not present

## 2016-03-09 DIAGNOSIS — N179 Acute kidney failure, unspecified: Secondary | ICD-10-CM | POA: Diagnosis present

## 2016-03-09 DIAGNOSIS — Z86010 Personal history of colonic polyps: Secondary | ICD-10-CM

## 2016-03-09 DIAGNOSIS — C7931 Secondary malignant neoplasm of brain: Secondary | ICD-10-CM | POA: Diagnosis not present

## 2016-03-09 DIAGNOSIS — D62 Acute posthemorrhagic anemia: Secondary | ICD-10-CM | POA: Diagnosis not present

## 2016-03-09 DIAGNOSIS — I10 Essential (primary) hypertension: Secondary | ICD-10-CM | POA: Diagnosis not present

## 2016-03-09 DIAGNOSIS — Z85118 Personal history of other malignant neoplasm of bronchus and lung: Secondary | ICD-10-CM

## 2016-03-09 DIAGNOSIS — K921 Melena: Secondary | ICD-10-CM | POA: Diagnosis not present

## 2016-03-09 DIAGNOSIS — Z6825 Body mass index (BMI) 25.0-25.9, adult: Secondary | ICD-10-CM

## 2016-03-09 DIAGNOSIS — Z8249 Family history of ischemic heart disease and other diseases of the circulatory system: Secondary | ICD-10-CM

## 2016-03-09 DIAGNOSIS — I82403 Acute embolism and thrombosis of unspecified deep veins of lower extremity, bilateral: Secondary | ICD-10-CM | POA: Diagnosis present

## 2016-03-09 DIAGNOSIS — C801 Malignant (primary) neoplasm, unspecified: Secondary | ICD-10-CM | POA: Diagnosis not present

## 2016-03-09 HISTORY — DX: Malignant (primary) neoplasm, unspecified: C80.1

## 2016-03-09 LAB — URINALYSIS, ROUTINE W REFLEX MICROSCOPIC
Bilirubin Urine: NEGATIVE
Glucose, UA: NEGATIVE mg/dL
Hgb urine dipstick: NEGATIVE
Ketones, ur: NEGATIVE mg/dL
Leukocytes, UA: NEGATIVE
NITRITE: NEGATIVE
Protein, ur: NEGATIVE mg/dL
SPECIFIC GRAVITY, URINE: 1.023 (ref 1.005–1.030)
pH: 5.5 (ref 5.0–8.0)

## 2016-03-09 LAB — HEPATIC FUNCTION PANEL
ALT: 18 U/L (ref 17–63)
AST: 22 U/L (ref 15–41)
Albumin: 3 g/dL — ABNORMAL LOW (ref 3.5–5.0)
Alkaline Phosphatase: 40 U/L (ref 38–126)
BILIRUBIN TOTAL: 0.1 mg/dL — AB (ref 0.3–1.2)
Total Protein: 6 g/dL — ABNORMAL LOW (ref 6.5–8.1)

## 2016-03-09 LAB — ABO/RH: ABO/RH(D): A NEG

## 2016-03-09 LAB — PROTIME-INR
INR: 1.58
Prothrombin Time: 19.1 seconds — ABNORMAL HIGH (ref 11.4–15.2)

## 2016-03-09 LAB — CBC WITH DIFFERENTIAL/PLATELET
BAND NEUTROPHILS: 1 %
BASOS PCT: 0 %
Basophils Absolute: 0 10*3/uL (ref 0.0–0.1)
EOS PCT: 3 %
Eosinophils Absolute: 0.4 10*3/uL (ref 0.0–0.7)
HCT: 19.1 % — ABNORMAL LOW (ref 39.0–52.0)
HEMOGLOBIN: 6.2 g/dL — AB (ref 13.0–17.0)
LYMPHS ABS: 1.8 10*3/uL (ref 0.7–4.0)
LYMPHS PCT: 14 %
MCH: 32.1 pg (ref 26.0–34.0)
MCHC: 32.5 g/dL (ref 30.0–36.0)
MCV: 99 fL (ref 78.0–100.0)
MONO ABS: 0.8 10*3/uL (ref 0.1–1.0)
Monocytes Relative: 6 %
Neutro Abs: 10.2 10*3/uL — ABNORMAL HIGH (ref 1.7–7.7)
Neutrophils Relative %: 76 %
Platelets: 413 10*3/uL — ABNORMAL HIGH (ref 150–400)
RBC: 1.93 MIL/uL — ABNORMAL LOW (ref 4.22–5.81)
RDW: 12.5 % (ref 11.5–15.5)
WBC: 13.2 10*3/uL — ABNORMAL HIGH (ref 4.0–10.5)

## 2016-03-09 LAB — HEMOGLOBIN AND HEMATOCRIT, BLOOD
HCT: 17.4 % — ABNORMAL LOW (ref 39.0–52.0)
Hemoglobin: 5.8 g/dL — CL (ref 13.0–17.0)

## 2016-03-09 LAB — TROPONIN I: Troponin I: <0.03 ng/mL (ref <0.03)

## 2016-03-09 LAB — BASIC METABOLIC PANEL
Anion gap: 8 (ref 5–15)
BUN: 63 mg/dL — ABNORMAL HIGH (ref 6–20)
CALCIUM: 8.6 mg/dL — AB (ref 8.9–10.3)
CO2: 21 mmol/L — AB (ref 22–32)
CREATININE: 1.6 mg/dL — AB (ref 0.61–1.24)
Chloride: 109 mmol/L (ref 101–111)
GFR calc Af Amer: 46 mL/min — ABNORMAL LOW (ref >60)
GFR calc non Af Amer: 39 mL/min — ABNORMAL LOW (ref >60)
GLUCOSE: 158 mg/dL — AB (ref 65–99)
Potassium: 4.1 mmol/L (ref 3.5–5.1)
Sodium: 138 mmol/L (ref 135–145)

## 2016-03-09 LAB — BRAIN NATRIURETIC PEPTIDE: B NATRIURETIC PEPTIDE 5: 21.9 pg/mL (ref 0.0–100.0)

## 2016-03-09 LAB — PREPARE RBC (CROSSMATCH)

## 2016-03-09 LAB — OCCULT BLOOD X 1 CARD TO LAB, STOOL: Fecal Occult Bld: POSITIVE — AB

## 2016-03-09 LAB — MAGNESIUM: Magnesium: 2 mg/dL (ref 1.7–2.4)

## 2016-03-09 MED ORDER — METOPROLOL TARTRATE 25 MG PO TABS
12.5000 mg | ORAL_TABLET | Freq: Two times a day (BID) | ORAL | Status: DC
  Administered 2016-03-09 – 2016-03-16 (×14): 12.5 mg via ORAL
  Filled 2016-03-09 (×14): qty 1

## 2016-03-09 MED ORDER — ACETAMINOPHEN 325 MG PO TABS
650.0000 mg | ORAL_TABLET | Freq: Once | ORAL | Status: AC
  Administered 2016-03-09: 650 mg via ORAL
  Filled 2016-03-09: qty 2

## 2016-03-09 MED ORDER — ORAL CARE MOUTH RINSE
15.0000 mL | Freq: Two times a day (BID) | OROMUCOSAL | Status: DC
  Administered 2016-03-10 – 2016-03-16 (×12): 15 mL via OROMUCOSAL

## 2016-03-09 MED ORDER — SODIUM CHLORIDE 0.9% FLUSH
3.0000 mL | Freq: Two times a day (BID) | INTRAVENOUS | Status: DC
  Administered 2016-03-09 – 2016-03-13 (×7): 3 mL via INTRAVENOUS

## 2016-03-09 MED ORDER — ENSURE ENLIVE PO LIQD
237.0000 mL | Freq: Two times a day (BID) | ORAL | Status: DC
  Administered 2016-03-10: 237 mL via ORAL

## 2016-03-09 MED ORDER — SODIUM CHLORIDE 0.9 % IV SOLN
Freq: Once | INTRAVENOUS | Status: AC
  Administered 2016-03-10: 13:00:00 via INTRAVENOUS

## 2016-03-09 NOTE — ED Notes (Addendum)
Patient presents from home with his wife with c/o shortness of breath that has worsened over past few days.  Patient was diagnosed with lung cancer and PE on 10/13 and is taking Eliquis for the PE.  He c/o shortness of breath that is worse with exertion as well as fatigue that is also much worse with exertion.  Patient also c/o headache that resolves with Tylenol, "but comes right back."    On exam, patient's lung sounds are CTAB.  S1/S2, +1 radial and pedal pulses.  No pedal edema noted.  Abdomen is soft and non-tender to palpation.  Patient appears pale and tired, but is alert and oriented x4.

## 2016-03-09 NOTE — Progress Notes (Signed)
[facility]  81 y/o male presents with dyspnea, Hb 6.2, PE on 10/13, on Xarelto. Lung vs Prostate CA with mets to the bone. Has an outp appt with Oncology.   Hemoccult +. One black stool 3 days ago. Vitals stable.  Has AKI as well. Admit to Med/surg.   Debbe Odea, MD

## 2016-03-09 NOTE — ED Triage Notes (Signed)
Pt in c/o SOB. Recent hx of PE x 2 weeks. Also recent dx with cancer of lung, hip, and prostate. Has not yet seen oncology. Pt is alert, interactive, in NAD.

## 2016-03-09 NOTE — Progress Notes (Signed)
Lab collected T & S at 2005.  I have just contacted lab who state the blood should be ready soon.

## 2016-03-09 NOTE — ED Provider Notes (Signed)
[facility] Provider Note   CSN: [id number] Arrival date & time: 03/09/16  1430   By signing my name below, I, Tim Ross, attest that this documentation has been prepared under the direction and in the presence of Tim Morgan, MD . Electronically Signed: Neta Ross, ED Scribe. 03/09/2016. 3:22 PM.   History   Chief Complaint Chief Complaint  Patient presents with  . Shortness of Breath    The history is provided by the patient. No language interpreter was used.   HPI Comments:  Tim Ross is a 81 y.o. male who presents to the Emergency Department complaining of cworsening SOB x 2 weeks. Per wife, pt gets short of breath when walking short distances and with minor exertion. Getting worse, even dyspnea walking to bathroom. Lightheadedness with exertion. Wife notes that pt has been looking increasingly pale for the past week. Pt complains of associated headache, lightheadedness, 1 episode of black stool 3 days ago, and states that he has chest pain when coughing. Pt reports that he had blood in his stool 3 days ago, but reports no other episodes. Pt was recently diagnosed with cancer of lung, hip and prostate, but has not yet seen an oncologist. Pt also reports a recent history of PE diagnosed 10/13. Pt started taking xarelto last week. No other alleviating factors noted. Pt denies blood in cough/urine, abdominal pain.    Past Medical History:  Diagnosis Date  . Cancer ([place])   . Colonic polyp   . GERD (gastroesophageal reflux disease)   . Hyperlipidemia   . Hypertension   . Muscular degeneration   . Pneumonia 2002  . Testicular cyst    removed-noncancerous    Patient Active Problem List   Diagnosis Date Noted  . Anemia 03/09/2016  . GI bleed 03/09/2016  . Acute pulmonary embolism ([place]) 03/04/2016  . CKD (chronic kidney disease), stage III 06/08/2014  . Hyperglycemia 06/08/2014  . Former smoker 05/29/2014  . Macular degeneration   .  Hyperlipidemia 04/29/2007  . Essential hypertension 04/29/2007  . GERD 04/29/2007  . History of colonic polyps 04/29/2007    Past Surgical History:  Procedure Laterality Date  . CATARACT EXTRACTION     right side  . KNEE SURGERY     scope on both  . LUMBAR LAMINECTOMY    . TRANSURETHRAL RESECTION OF PROSTATE     resolved issues       Home Medications    Prior to Admission medications   Medication Sig Start Date End Date Taking? Authorizing Provider  CINNAMON PO Take 2 tablets by mouth every morning.    Yes Historical Provider, MD  losartan (COZAAR) 25 MG tablet Take 1 tablet (25 mg total) by mouth daily. Patient taking differently: Take 25 mg by mouth every morning.  02/18/16  Yes Marin Olp, MD  Multiple Vitamin (MULTIVITAMIN WITH MINERALS) TABS tablet Take 1 tablet by mouth every morning.    Yes Historical Provider, MD  Multiple Vitamins-Minerals (ICAPS) CAPS Take 1 capsule by mouth 2 (two) times daily.   Yes Historical Provider, MD  Omega-3 Fatty Acids (FISH OIL PO) Take 1 capsule by mouth every morning.    Yes Historical Provider, MD  omeprazole (PRILOSEC) 20 MG capsule Take 1 capsule (20 mg total) by mouth daily. Patient taking differently: Take 20 mg by mouth every other day.  02/18/16  Yes Marin Olp, MD  Rivaroxaban (XARELTO) 15 MG TABS tablet Take 1 tablet (15 mg total) by mouth 2 (  two) times daily with a meal. 02/29/16  Yes Eulas Post, MD  rivaroxaban (XARELTO) 20 MG TABS tablet Take 1 tablet (20 mg total) by mouth daily with supper. Start the day after you finish the 15 mg pills Patient not taking: Reported on 03/09/2016 03/04/16   Marin Olp, MD    Family History Family History  Problem Relation Age of Onset  . Diabetes Mother   . Hypertension Mother   . Heart attack Father     late 47s, former smoker  . Heart disease Brother     23s, smokers    Social History Social History  Substance Use Topics  . Smoking status: Former Smoker     Packs/day: 1.50    Years: 40.00    Types: Cigarettes    Quit date: 12/30/1990  . Smokeless tobacco: Never Used  . Alcohol use No     Allergies   Celebrex [celecoxib]   Review of Systems Review of Systems  Constitutional: Positive for fatigue. Negative for fever.  HENT: Negative for sore throat.   Eyes: Negative for visual disturbance.  Respiratory: Positive for cough and shortness of breath.   Cardiovascular: Negative for chest pain.  Gastrointestinal: Positive for blood in stool. Negative for abdominal pain, constipation, diarrhea, nausea and vomiting.  Genitourinary: Negative for difficulty urinating and hematuria.  Musculoskeletal: Negative for back pain and neck stiffness.  Skin: Negative for rash.  Neurological: Positive for light-headedness. Negative for syncope and headaches.  All other systems reviewed and are negative.    Physical Exam Updated Vital Signs BP (!) 122/59   Pulse 80   Temp 98.2 F (36.8 C) (Oral)   Resp 16   Ht '6\' 1"'$  (1.854 m)   Wt 195 lb (88.5 kg)   SpO2 98%   BMI 25.73 kg/m   Physical Exam  Constitutional: He appears well-developed and well-nourished.  HENT:  Head: Normocephalic and atraumatic.  Eyes: Conjunctivae are normal.  Neck: Neck supple.  Cardiovascular: Normal rate, regular rhythm, normal heart sounds and intact distal pulses.   No murmur heard. Pulmonary/Chest: Effort normal and breath sounds normal. No respiratory distress. He has no wheezes. He has no rales.  Abdominal: Soft. There is no tenderness.  Genitourinary: Rectal exam shows guaiac positive stool.  Musculoskeletal: He exhibits no edema.  Neurological: He is alert.  Skin: Skin is warm and dry.  Psychiatric: He has a normal mood and affect.  Nursing note and vitals reviewed.    ED Treatments / Results  DIAGNOSTIC STUDIES:  Oxygen Saturation is 100% on RA, normal by my interpretation.    COORDINATION OF CARE:  3:22 PM Discussed treatment plan with pt at  bedside and pt agreed to plan.   Labs (all labs ordered are listed, but only abnormal results are displayed) Labs Reviewed  BASIC METABOLIC PANEL - Abnormal; Notable for the following:       Result Value   CO2 21 (*)    Glucose, Bld 158 (*)    BUN 63 (*)    Creatinine, Ser 1.60 (*)    Calcium 8.6 (*)    GFR calc non Af Amer 39 (*)    GFR calc Af Amer 46 (*)    All other components within normal limits  CBC WITH DIFFERENTIAL/PLATELET - Abnormal; Notable for the following:    WBC 13.2 (*)    RBC 1.93 (*)    Hemoglobin 6.2 (*)    HCT 19.1 (*)    Platelets 413 (*)  Neutro Abs 10.2 (*)    All other components within normal limits  HEPATIC FUNCTION PANEL - Abnormal; Notable for the following:    Total Protein 6.0 (*)    Albumin 3.0 (*)    Total Bilirubin 0.1 (*)    Bilirubin, Direct <0.1 (*)    All other components within normal limits  OCCULT BLOOD X 1 CARD TO LAB, STOOL - Abnormal; Notable for the following:    Fecal Occult Bld POSITIVE (*)    All other components within normal limits  HEMOGLOBIN AND HEMATOCRIT, BLOOD - Abnormal; Notable for the following:    Hemoglobin 5.8 (*)    HCT 17.4 (*)    All other components within normal limits  PROTIME-INR - Abnormal; Notable for the following:    Prothrombin Time 19.1 (*)    All other components within normal limits  URINE CULTURE  TROPONIN I  BRAIN NATRIURETIC PEPTIDE  MAGNESIUM  URINALYSIS, ROUTINE W REFLEX MICROSCOPIC (NOT AT [facility]  BASIC METABOLIC PANEL  PSA  CBC  TYPE AND SCREEN  PREPARE RBC (CROSSMATCH)  ABO/RH    EKG  EKG Interpretation  Date/Time:  Sunday March 09 2016 15:18:30 EDT Ventricular Rate:  104 PR Interval:    QRS Duration: 106 QT Interval:  349 QTC Calculation: 459 R Axis:   12 Text Interpretation:  Sinus tachycardia Borderline repolarization abnormality Since prior ECG, rate has increased, no other significant change from prior Confirmed by [facility] MD, [place] ([id number]) on 03/10/2016 2:44:31  AM       Radiology Dg Chest 2 View  Result Date: 03/09/2016 CLINICAL DATA:  Shortness of Breath, lung cancer EXAM: CHEST  2 VIEW COMPARISON:  03/05/2016 FINDINGS: Cardiomediastinal silhouette is stable. Persistent left hilar prominence. Persistent left perihilar interstitial prominence probable interstitial tumor spread or pneumonitis. Peripheral consolidation in left upper lobe is stable. Mild hyperinflation. Right lung is clear. Stable sclerotic lesion in right fourth rib. IMPRESSION: Persistent left hilar prominence. Persistent left perihilar interstitial prominence probable interstitial tumor spread or pneumonitis. Peripheral consolidation in left upper lobe is stable. Mild hyperinflation. Right lung is clear. Electronically Signed   By: Lahoma Crocker M.D.   On: 03/09/2016 15:21    Procedures Procedures (including critical care time)  Medications Ordered in ED Medications  0.9 %  sodium chloride infusion (not administered)  metoprolol tartrate (LOPRESSOR) tablet 12.5 mg (12.5 mg Oral Given 03/09/16 2228)  feeding supplement (ENSURE ENLIVE) (ENSURE ENLIVE) liquid 237 mL (not administered)  MEDLINE mouth rinse (15 mLs Mouth Rinse Not Given 03/09/16 2229)  sodium chloride flush (NS) 0.9 % injection 3 mL (3 mLs Intravenous Given 03/09/16 2229)  acetaminophen (TYLENOL) tablet 650 mg (650 mg Oral Given 03/09/16 1648)     Initial Impression / Assessment and Plan / ED Course  I have reviewed the triage vital signs and the nursing notes.  Pertinent labs & imaging results that were available during my care of the patient were reviewed by me and considered in my medical decision making (see chart for details).  Clinical Course   81yo male with history of hypertension, hyperlipidemia, recent CT showing 2-3 small pulmonary emboli initiated on xarelto, PET showing concern for hypermetabolic lung mass, prostate cancer, lesions humerus T10/adrenal lesion, and possible subdural, who presents with  dyspnea.  Hgb 6.2. Possible GI source with black stool days ago, hemoccult positive on exam, however doubt acute severe GI bleed by hx.  Anemia likely contributing to dyspnea. Pt with mild tachycardia, otherwise hemodynamically stable.  Pt also reports  headache--given do not want further imaging to slow transfer in setting of pt needing transfusion (and unable to obtain crossmatched blood here at [facility]) and MR will be better for eval for mets, will hold off on further head imaging at this time. Will admit to [facility] for transfusion, further oncologic work up.    Final Clinical Impressions(s) / ED Diagnoses   Final diagnoses:  Metastasis ([place])  SOB (shortness of breath)  Anemia, unspecified type    New Prescriptions Current Discharge Medication List    I personally performed the services described in this documentation, which was scribed in my presence. The recorded information has been reviewed and is accurate.     Tim Morgan, MD 03/10/16 [phone]

## 2016-03-09 NOTE — H&P (Addendum)
History and Physical  SARA SELVIDGE DDU:[id number] DOB: 1934/10/11 DOA: 03/09/2016  Referring physician: EDP PCP: Garret Reddish, MD   Chief Complaint: weakness, sob, acute anemia  HPI: Tim Ross is a 81 y.o. male   H/o smoking quit 53yr ago, h/o htn, diverticulosis, recent diagnosis of PE and metastatic cancer with lung vs prostate primary mets to bone , he is started on xarelto two weeks ago, [facility] due to above complain, found to have acute anemia, hgb 6.2, wbc 13.2 , bun 63, cr .16, he has sinus tachycardia, heart rate around 110's bp stable, he does not have hypoxia at rest, stool occult +, [facility]  Patient Reports noticed dark stool in the last few days, He denies abdominal pain, no n/v. He denies any other source of blood loss. No fever.   Review of Systems:  Detail per HPI, Review of systems are otherwise negative  Past Medical History:  Diagnosis Date  . Cancer ([place]   . Colonic polyp   . GERD (gastroesophageal reflux disease)   . Hyperlipidemia   . Hypertension   . Muscular degeneration   . Pneumonia 2002  . Testicular cyst    removed-noncancerous   Past Surgical History:  Procedure Laterality Date  . CATARACT EXTRACTION     right side  . KNEE SURGERY     scope on both  . LUMBAR LAMINECTOMY    . TRANSURETHRAL RESECTION OF PROSTATE     resolved issues   Social History:  reports that he quit smoking about 25 years ago. His smoking use included Cigarettes. He has a 60.00 pack-year smoking history. He has never used smokeless tobacco. He reports that he does not drink alcohol or use drugs. Patient lives at home& is able to participate in activities of daily living independently   Allergies  Allergen Reactions  . Celebrex [Celecoxib] Hives    Family History  Problem Relation Age of Onset  . Diabetes Mother   . Hypertension Mother   . Heart attack Father    late 727s former smoker  . Heart disease Brother     64s smokers      Prior to Admission medications   Medication Sig Start Date End Date Taking? Authorizing Provider  CINNAMON PO Take 2 tablets by mouth daily.    Historical Provider, MD  losartan (COZAAR) 25 MG tablet Take 1 tablet (25 mg total) by mouth daily. 02/18/16   SMarin Olp MD  Multiple Vitamin (MULTIVITAMIN WITH MINERALS) TABS tablet Take 1 tablet by mouth daily.    Historical Provider, MD  Multiple Vitamins-Minerals (ICAPS) CAPS Take 1 capsule by mouth 2 (two) times daily.    Historical Provider, MD  Omega-3 Fatty Acids (FISH OIL PO) Take 1 capsule by mouth daily.    Historical Provider, MD  omeprazole (PRILOSEC) 20 MG capsule Take 1 capsule (20 mg total) by mouth daily. 02/18/16   SMarin Olp MD  Rivaroxaban (XARELTO) 15 MG TABS tablet Take 1 tablet (15 mg total) by mouth 2 (two) times daily with a meal. 02/29/16   BEulas Post MD  rivaroxaban (XARELTO) 20 MG TABS tablet Take 1 tablet (20 mg total) by mouth daily with supper. Start the day after you finish the 15 mg pills 03/04/16   SMarin Olp MD    Physical Exam: BP (!) 146/67 (BP Location: Right Arm)   Pulse (!) 104   Temp  97.9 F (36.6 C) (Oral)   Resp 20   Ht '6\' 1"'$  (1.854 m)   Wt 88.5 kg (195 lb)   SpO2 100%   BMI 25.73 kg/m   General:  Pale, weak but NAD Eyes: PERRL ENT: unremarkable Neck: supple, no JVD Cardiovascular: RRR Respiratory: CTABL Abdomen: soft/ND/ND, positive bowel sounds Skin: no rash Musculoskeletal:  No edema Psychiatric: calm/cooperative Neurologic: no focal findings            Labs on Admission:  Basic Metabolic Panel:  Recent Labs Lab 03/09/16 1456  NA 138  K 4.1  CL 109  CO2 21*  GLUCOSE 158*  BUN 63*  CREATININE 1.60*  CALCIUM 8.6*  MG 2.0   Liver Function Tests:  Recent Labs Lab 03/09/16 1456  AST 22  ALT 18  ALKPHOS 40  BILITOT 0.1*  PROT 6.0*  ALBUMIN 3.0*   No results for  input(s): LIPASE, AMYLASE in the last 168 hours. No results for input(s): AMMONIA in the last 168 hours. CBC:  Recent Labs Lab 03/09/16 1456  WBC 13.2*  NEUTROABS 10.2*  HGB 6.2*  HCT 19.1*  MCV 99.0  PLT 413*   Cardiac Enzymes:  Recent Labs Lab 03/09/16 1456  TROPONINI <0.03    BNP (last 3 results)  Recent Labs  03/09/16 1456  BNP 21.9    ProBNP (last 3 results)  Recent Labs  02/27/16 1031  PROBNP 37.0    CBG:  Recent Labs Lab 03/05/16 1341  GLUCAP 121*    Radiological Exams on Admission: Dg Chest 2 View  Result Date: 03/09/2016 CLINICAL DATA:  Shortness of Breath, lung cancer EXAM: CHEST  2 VIEW COMPARISON:  03/05/2016 FINDINGS: Cardiomediastinal silhouette is stable. Persistent left hilar prominence. Persistent left perihilar interstitial prominence probable interstitial tumor spread or pneumonitis. Peripheral consolidation in left upper lobe is stable. Mild hyperinflation. Right lung is clear. Stable sclerotic lesion in right fourth rib. IMPRESSION: Persistent left hilar prominence. Persistent left perihilar interstitial prominence probable interstitial tumor spread or pneumonitis. Peripheral consolidation in left upper lobe is stable. Mild hyperinflation. Right lung is clear. Electronically Signed   By: Lahoma Crocker M.D.   On: 03/09/2016 15:21    EKG: Independently reviewed. Sinus tachcyardia  Assessment/Plan Present on Admission: . Anemia  Acute blood loss anemia/sumptomatic anemia:  like from gi bleed after started on anticoagulation a few weeks ago. plt wnl, INR pending. Report noticed dark stool in the last few days, FOBT test positive. He denies abdominal pain, no n/v.  hgb 6.2today, it was 12.3 a week ago, will transfuse two units prbc , repeat hgb one hr after two prbc, transfuse to keep hgb >7.  Admit to tele bed.  [facility] Dr Edison Nasuti consulted who recommended keep npo after midnight, in case he will be scoped tomorrow, but may need to be scoped on  Teusday, since patient has been on xarelto.  HTN: hold home meds cozaar, in the setting of elevated cr and sinus tachycardia, will try low dose betablocker with holding parameters.  New diagnosis of PE, CTA from 10/13 showed 'Two or 3 small pulmonary emboli." no hypoxia, bp stable,  hold xarelto in the setting of acute blood loss anemia.   Metastatic cancer,  new diagnosis, lung vs prostate primary with bone mets,  PET scan on 10/18 showed lung mass, bone mets and possible prostate involvement. Also showed "Asymmetric extra-axial hypodensity along the right frontal lobe, possibly a chronic subdural hematoma or cystic hygroma," will get MRI brain w/wo contrast,  check psa.  Need to call Oncology in am.  Recent gout attack: resolved, he was taken off HCTZ. He only drinks alcohol occasionally.  DVT prophylaxis: scd's  Consultants:  [facility] Dr Edison Nasuti Need to call oncology in am  Code Status: full   Family Communication:  Patient and multiple family members in room including wife/daughter/stepson and daughter in law  Disposition Plan: admit to med tele  Time spent: 81mns  Nikitia Asbill MD, PhD [facility] Pager [phone] 7PM-7AM, please contact night-coverage at [email or web address], password [facility]

## 2016-03-10 ENCOUNTER — Telehealth: Payer: Self-pay | Admitting: Internal Medicine

## 2016-03-10 ENCOUNTER — Inpatient Hospital Stay (HOSPITAL_COMMUNITY): Payer: Medicare Other

## 2016-03-10 ENCOUNTER — Encounter: Payer: Self-pay | Admitting: Internal Medicine

## 2016-03-10 DIAGNOSIS — C3490 Malignant neoplasm of unspecified part of unspecified bronchus or lung: Secondary | ICD-10-CM | POA: Diagnosis present

## 2016-03-10 DIAGNOSIS — D62 Acute posthemorrhagic anemia: Secondary | ICD-10-CM | POA: Diagnosis present

## 2016-03-10 DIAGNOSIS — I2699 Other pulmonary embolism without acute cor pulmonale: Secondary | ICD-10-CM

## 2016-03-10 DIAGNOSIS — K921 Melena: Secondary | ICD-10-CM | POA: Diagnosis present

## 2016-03-10 DIAGNOSIS — I2782 Chronic pulmonary embolism: Secondary | ICD-10-CM

## 2016-03-10 DIAGNOSIS — C771 Secondary and unspecified malignant neoplasm of intrathoracic lymph nodes: Secondary | ICD-10-CM

## 2016-03-10 DIAGNOSIS — Z87891 Personal history of nicotine dependence: Secondary | ICD-10-CM

## 2016-03-10 DIAGNOSIS — C7951 Secondary malignant neoplasm of bone: Secondary | ICD-10-CM

## 2016-03-10 DIAGNOSIS — C3412 Malignant neoplasm of upper lobe, left bronchus or lung: Secondary | ICD-10-CM

## 2016-03-10 DIAGNOSIS — G893 Neoplasm related pain (acute) (chronic): Secondary | ICD-10-CM

## 2016-03-10 LAB — PSA: PSA: 1.13 ng/mL (ref 0.00–4.00)

## 2016-03-10 LAB — CBC
HEMATOCRIT: 22.7 % — AB (ref 39.0–52.0)
Hemoglobin: 7.7 g/dL — ABNORMAL LOW (ref 13.0–17.0)
MCH: 31.2 pg (ref 26.0–34.0)
MCHC: 33.9 g/dL (ref 30.0–36.0)
MCV: 91.9 fL (ref 78.0–100.0)
PLATELETS: 313 10*3/uL (ref 150–400)
RBC: 2.47 MIL/uL — AB (ref 4.22–5.81)
RDW: 15 % (ref 11.5–15.5)
WBC: 13.7 10*3/uL — ABNORMAL HIGH (ref 4.0–10.5)

## 2016-03-10 LAB — PREPARE RBC (CROSSMATCH)

## 2016-03-10 LAB — BASIC METABOLIC PANEL
ANION GAP: 5 (ref 5–15)
BUN: 56 mg/dL — ABNORMAL HIGH (ref 6–20)
CALCIUM: 8.2 mg/dL — AB (ref 8.9–10.3)
CO2: 21 mmol/L — ABNORMAL LOW (ref 22–32)
CREATININE: 1.33 mg/dL — AB (ref 0.61–1.24)
Chloride: 113 mmol/L — ABNORMAL HIGH (ref 101–111)
GFR, EST AFRICAN AMERICAN: 57 mL/min — AB (ref >60)
GFR, EST NON AFRICAN AMERICAN: 49 mL/min — AB (ref >60)
GLUCOSE: 108 mg/dL — AB (ref 65–99)
Potassium: 4 mmol/L (ref 3.5–5.1)
Sodium: 139 mmol/L (ref 135–145)

## 2016-03-10 LAB — HEMOGLOBIN: Hemoglobin: 8.6 g/dL — ABNORMAL LOW (ref 13.0–17.0)

## 2016-03-10 MED ORDER — OXYCODONE HCL 5 MG/5ML PO SOLN
2.5000 mg | Freq: Four times a day (QID) | ORAL | Status: DC | PRN
  Administered 2016-03-10 – 2016-03-16 (×14): 5 mg via ORAL
  Filled 2016-03-10: qty 25
  Filled 2016-03-10 (×6): qty 5
  Filled 2016-03-10: qty 25
  Filled 2016-03-10: qty 5
  Filled 2016-03-10 (×5): qty 25
  Filled 2016-03-10: qty 5

## 2016-03-10 MED ORDER — ZOLPIDEM TARTRATE 5 MG PO TABS: 5.0000 mg | ORAL_TABLET | Freq: Every evening | ORAL | Status: DC | PRN

## 2016-03-10 MED ORDER — GADOBENATE DIMEGLUMINE 529 MG/ML IV SOLN
20.0000 mL | Freq: Once | INTRAVENOUS | Status: AC | PRN
  Administered 2016-03-10: 17 mL via INTRAVENOUS

## 2016-03-10 MED ORDER — IPRATROPIUM-ALBUTEROL 0.5-2.5 (3) MG/3ML IN SOLN: 3.0000 mL | Freq: Four times a day (QID) | RESPIRATORY_TRACT | Status: DC

## 2016-03-10 MED ORDER — SODIUM CHLORIDE 0.9 % IV SOLN: Freq: Once | INTRAVENOUS | Status: DC

## 2016-03-10 MED ORDER — PANTOPRAZOLE SODIUM 40 MG PO TBEC
40.0000 mg | DELAYED_RELEASE_TABLET | Freq: Two times a day (BID) | ORAL | Status: DC
  Administered 2016-03-10 – 2016-03-16 (×13): 40 mg via ORAL
  Filled 2016-03-10 (×13): qty 1

## 2016-03-10 MED ORDER — SODIUM CHLORIDE 0.9 % IV SOLN
INTRAVENOUS | Status: DC
  Administered 2016-03-10: 22:00:00 via INTRAVENOUS

## 2016-03-10 MED ORDER — ACETAMINOPHEN 325 MG PO TABS
650.0000 mg | ORAL_TABLET | Freq: Four times a day (QID) | ORAL | Status: DC | PRN
  Administered 2016-03-10 – 2016-03-12 (×2): 650 mg via ORAL
  Filled 2016-03-10 (×2): qty 2

## 2016-03-10 NOTE — Consult Note (Addendum)
Consultation  Referring Provider:  Dr. Erlinda Hong    Primary Care Physician:  Garret Reddish, MD Primary Gastroenterologist:  Dr. Olevia Perches     Reason for Consultation:   Melena           HPI:   Tim Ross is a 81 y.o. Caucasian  male with a past medical history of hypertension, diverticulosis, GERD, hyperlipidemia and a recent diagnosis of PE and metastatic cancer with lung versus prostate primary metastases to bone, who presented [facility] on 03/09/16 with a complaint of profound or shortness of breath and fatigue. He was transferred to [facility].  Today, the patient is surrounded by his wife and daughter who do assist with his history. He tells me that 2 weeks ago he became short of breath and was found to have blood clots in his lungs. At that time he was started on Xarelto, October 13, and had a PET scan which revealed lung cancer. The family has not seen oncology yet, but are awaiting their arrival later today. They explain that he had been short of breath since that time but yesterday this was so severe that he could "hardly walk 2 feet without having to sit down and rest". They brought him in to the ER. The patient tells me that he did have some black stool around 3-4 days ago, but believes this was only once and has had no bowel movement since then. He has had a decreased appetite over the past few weeks. He chronically takes Prilosec 20 mg daily for heartburn and reflux symptoms which he has been on for over 5 years. His last dose of Xarelto was 03/09/16 in the morning.  Overnight the patient tells me that he has had 2 units of blood and feels a lot better. He denies further bowel movements or other new symptoms. He is hungry.  Patient denies NSAID use, fever, chills, dysphagia, heartburn, reflux, nausea, vomiting, abdominal pain, diarrhea, constipation or previous episodes of the same.  ED course: Found to have an acute anemia with a hemoglobin at 6.2, white blood  cells 13.2, BUN 63, creatinine 1.6, and sinus tachycardia, heart rate around 110, BP stable, stool occult positive  Past Medical History:  Diagnosis Date  . Cancer ([place])   . Colonic polyp   . GERD (gastroesophageal reflux disease)   . Hyperlipidemia   . Hypertension   . Muscular degeneration   . Pneumonia 2002  . Testicular cyst    removed-noncancerous    Past Surgical History:  Procedure Laterality Date  . CATARACT EXTRACTION     right side  . KNEE SURGERY     scope on both  . LUMBAR LAMINECTOMY    . TRANSURETHRAL RESECTION OF PROSTATE     resolved issues    Family History  Problem Relation Age of Onset  . Diabetes Mother   . Hypertension Mother   . Heart attack Father     late 45s, former smoker  . Heart disease Brother     53s, smokers    Social History  Substance Use Topics  . Smoking status: Former Smoker    Packs/day: 1.50    Years: 40.00    Types: Cigarettes    Quit date: 12/30/1990  . Smokeless tobacco: Never Used  . Alcohol use No    Prior to Admission medications   Medication Sig Start Date End Date Taking? Authorizing Provider  CINNAMON PO Take 2 tablets by mouth every  morning.    Yes Historical Provider, MD  losartan (COZAAR) 25 MG tablet Take 1 tablet (25 mg total) by mouth daily. Patient taking differently: Take 25 mg by mouth every morning.  02/18/16  Yes Marin Olp, MD  Multiple Vitamin (MULTIVITAMIN WITH MINERALS) TABS tablet Take 1 tablet by mouth every morning.    Yes Historical Provider, MD  Multiple Vitamins-Minerals (ICAPS) CAPS Take 1 capsule by mouth 2 (two) times daily.   Yes Historical Provider, MD  Omega-3 Fatty Acids (FISH OIL PO) Take 1 capsule by mouth every morning.    Yes Historical Provider, MD  omeprazole (PRILOSEC) 20 MG capsule Take 1 capsule (20 mg total) by mouth daily. Patient taking differently: Take 20 mg by mouth every other day.  02/18/16  Yes Marin Olp, MD  Rivaroxaban (XARELTO) 15 MG TABS tablet Take 1  tablet (15 mg total) by mouth 2 (two) times daily with a meal. 02/29/16  Yes Eulas Post, MD  rivaroxaban (XARELTO) 20 MG TABS tablet Take 1 tablet (20 mg total) by mouth daily with supper. Start the day after you finish the 15 mg pills Patient not taking: Reported on 03/09/2016 03/04/16   Marin Olp, MD    Current Facility-Administered Medications  Medication Dose Route Frequency Provider Last Rate Last Dose  . 0.9 %  sodium chloride infusion   Intravenous Once Florencia Reasons, MD      . 0.9 %  sodium chloride infusion   Intravenous Once [place], [place]      . feeding supplement (ENSURE ENLIVE) (ENSURE ENLIVE) liquid 237 mL  237 mL Oral BID BM Florencia Reasons, MD      . MEDLINE mouth rinse  15 mL Mouth Rinse BID Florencia Reasons, MD   15 mL at 03/10/16 1011  . metoprolol tartrate (LOPRESSOR) tablet 12.5 mg  12.5 mg Oral BID Florencia Reasons, MD   12.5 mg at 03/10/16 1010  . pantoprazole (PROTONIX) EC tablet 40 mg  40 mg Oral BID Lavone Nian [place], [place]   40 mg at 03/10/16 1010  . sodium chloride flush (NS) 0.9 % injection 3 mL  3 mL Intravenous Q12H Florencia Reasons, MD   3 mL at 03/10/16 1013    Allergies as of 03/09/2016 - Review Complete 03/09/2016  Allergen Reaction Noted  . Celebrex [celecoxib] Hives 03/07/2013     Review of Systems:     Constitutional: Positive for weight loss, weakness and fatigue No fever or chills HEENT: Eyes: No change in vision               Ears, Nose, Throat:  No change in hearing or congestion Skin: No rash or itching Cardiovascular: No chest pain, chest pressure or palpitations   Respiratory: Positive for shortness of breath and dyspnea on exertion No cough Gastrointestinal: See HPI and otherwise negative Genitourinary: No dysuria or change in urinary frequency Neurological: No headache, dizziness or syncope Musculoskeletal: No new muscle or joint pain Hematologic: Positive for melena No bruising Psychiatric: No history of depression or anxiety    Physical Exam:    Vital signs in last 24 hours: Temp:  [97.8 F (36.6 C)-98.4 F (36.9 C)] 98.3 F (36.8 C) (10/23 0422) Pulse Rate:  [79-112] 83 (10/23 0422) Resp:  [16-29] 16 (10/23 0422) BP: (118-152)/(53-71) 121/61 (10/23 0422) SpO2:  [95 %-100 %] 96 % (10/23 0422) Weight:  [195 lb (88.5 kg)] 195 lb (88.5 kg) (10/22 1432) Last BM Date: 03/07/16 General:   Pleasant Caucasian  male appears to be in NAD, Well developed, Well nourished, alert and cooperative Head:  Normocephalic and atraumatic. Eyes:   PEERL, EOMI. No icterus. Conjunctiva pink. Ears:  Normal auditory acuity. Neck:  Supple Throat: Oral cavity and pharynx without inflammation, swelling or lesion.  Lungs: Respirations even and unlabored. Lungs clear to auscultation bilaterally.   No wheezes, crackles, or rhonchi.  Heart: Normal S1, S2. No MRG. Regular rate and rhythm. No peripheral edema, cyanosis or pallor.  Abdomen:  Soft, nondistended, nontender. No rebound or guarding. Normal bowel sounds. No appreciable masses or hepatomegaly. Rectal:  Not performed.  Msk:  Symmetrical without gross deformities.  Extremities:  Without edema, no deformity or joint abnormality.  Neurologic:  Alert and  oriented x4;  grossly normal neurologically.  Skin:   Dry and intact without significant lesions or rashes. Psychiatric: Oriented to person, place and time. Demonstrates good judgement and reason without abnormal affect or behaviors.   LAB RESULTS:  Recent Labs  03/09/16 1456 03/09/16 1938 03/10/16 0759  WBC 13.2*  --  13.7*  HGB 6.2* 5.8* 7.7*  HCT 19.1* 17.4* 22.7*  PLT 413*  --  313   BMET  Recent Labs  03/09/16 1456 03/10/16 0759  NA 138 139  K 4.1 4.0  CL 109 113*  CO2 21* 21*  GLUCOSE 158* 108*  BUN 63* 56*  CREATININE 1.60* 1.33*  CALCIUM 8.6* 8.2*   LFT  Recent Labs  03/09/16 1456  PROT 6.0*  ALBUMIN 3.0*  AST 22  ALT 18  ALKPHOS 40  BILITOT 0.1*  BILIDIR <0.1*  IBILI NOT CALCULATED   PT/INR  Recent Labs   03/09/16 1938  LABPROT 19.1*  INR 1.58    STUDIES: Dg Chest 2 View  Result Date: 03/09/2016 CLINICAL DATA:  Shortness of Breath, lung cancer EXAM: CHEST  2 VIEW COMPARISON:  03/05/2016 FINDINGS: Cardiomediastinal silhouette is stable. Persistent left hilar prominence. Persistent left perihilar interstitial prominence probable interstitial tumor spread or pneumonitis. Peripheral consolidation in left upper lobe is stable. Mild hyperinflation. Right lung is clear. Stable sclerotic lesion in right fourth rib. IMPRESSION: Persistent left hilar prominence. Persistent left perihilar interstitial prominence probable interstitial tumor spread or pneumonitis. Peripheral consolidation in left upper lobe is stable. Mild hyperinflation. Right lung is clear. Electronically Signed   By: Lahoma Crocker M.D.   On: 03/09/2016 15:21     PREVIOUS ENDOSCOPIES:            11/07/08: Colonoscopy, Dr. Dortha Kern moderate diverticula in the sigmoid colon   Impression / Plan:   Impression: 1. Acute GI blood loss: History of melenic stool 1, 3-4 days ago, hemoglobin 6.2 at time of presentation to ED 03/09/16, previously 12.3 a week ago, now 7.7 after 2 units PRBCs; most likely this represents an upper GI bleed exacerbated by recent Xarelto use, last dose Xarelto given 03/09/16 a.m. 2. Acute blood loss anemia: Due to hemorrhage as above 3. Metastatic cancer: Recently diagnosed 2 weeks ago, PET scan on 10/18 shows lung mass, bone metastases and possible prostate involvement, oncology will see later today 4. GERD: Long history of at least 5 years, no previous EGD, was controlled on Prilosec 20 mg daily at home  Plan: 1. Ordered 1 more unit PRBCs to be transfused today 2. Hemoglobin every 6 H with transfusion as needed to keep hemoglobin greater than 7 3. Discussed EGD with the patient and his family. Discussed risks, benefits, limitations and alternatives and the patient agrees to proceed. This is scheduled for  tomorrow to allow Xarelto time to leave his system. This will be with Dr. Loletha Carrow. 4. Patient may eat a low residue diet today and then be nothing by mouth after midnight 5. Ordered Protonix '40mg'$  PO BID 6. Please await any further recommendations from Dr. Loletha Carrow  Thank you for your kind consultation, we will continue to follow.  Lavone Nian Lemmon  03/10/2016, 10:18 AM Pager #: [phone]   I have reviewed the entire case in detail with the above APP and discussed the plan in detail.  Therefore, I agree with the diagnoses recorded above. In addition,  I have personally interviewed and examined the patient and have personally reviewed any abdominal/pelvic CT scan images.  My additional thoughts are as follows:  CC: Melena, anemia, suspected Upper GI source in patient on [place].  Clinically stable now. Would like more time for [facility] to get out of his system.  EGD tomorrow, time TBD.  Patient agreeable. Agree with another PRBC today PPI started.  Patient at increased risk for cardiopulmonary complications of procedure due to medical comorbidities.  Total time 60 minutes, over half spent in chart review and discussion with patient and family.  [name] Pager [phone]  Mon-Fri 8a-5p [phone] after 5p, weekends, holidays

## 2016-03-10 NOTE — Telephone Encounter (Signed)
Appt scheduled w/Mohamed for 10/31 at 215pm. Left a vm on the patient's phone with the appt information. Will attempt to contact the pt again. Letter mailed.

## 2016-03-10 NOTE — Progress Notes (Signed)
PROGRESS NOTE  Tim Ross EHM:[id number] DOB: Mar 19, 1935 DOA: 03/09/2016 PCP: Garret Reddish, MD  HPI/Recap of past 24 hours:  Feeling tired after having MRI of the brain, denies pain, currently getting prbc transfusion, Does has intermittent dry cough which is not new per patient  wife and daughter in room  Assessment/Plan: Active Problems:   Anemia   GI bleed   Acute blood loss anemia/suyptomatic anemia:  like from gi bleed after started on anticoagulation a few weeks ago. plt wnl, INR wnl. Report noticed dark stool in the last few days, FOBT test positive. He denies abdominal pain, no n/v.  hgb 6.2today, it was 12.3 a week ago, s/p two units prbc on 10/22, and prbcx1 on 10/23 , monitor hfb, transfuse to keep hgb >7.  tele bed.  [facility] input appreciated, plan for egd on 10/24 to allow xarelto wash out.  HTN: hold home meds cozaar, in the setting of elevated cr and sinus tachycardia, will try low dose betablocker with holding parameters.  New diagnosis of PE, CTA from 10/13 showed 'Two or 3 small pulmonary emboli." no hypoxia, bp stable,  hold xarelto in the setting of acute blood loss anemia.   Metastatic cancer,  new diagnosis, lung vs prostate primary with bone mets,  PET scan on 10/18 showed lung mass, bone mets and possible prostate involvement. 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  psa 0.93  Oncology consulted, will likely need to biopsy lung mass, will f/u on oncology recommendation. Last dose  xarelto on 10/22  Recent gout attack: resolved, he was taken off HCTZ. He only drinks alcohol occasionally.  Right shoulder bursitis: pain control, pmd follow up  DVT prophylaxis: scd's  Consultants:  [facility]  Oncology Dr Rosine Door   Code Status: full    Family Communication:  Patient, his wife and daughter  Disposition Plan:pending   Procedures:  EGD planned on 10/24  Antibiotics:  none   Objective: BP 121/61   Pulse 83   Temp 98.3 F (36.8 C) (Oral)   Resp 16   Ht '6\' 1"'$  (1.854 m)   Wt 88.5 kg (195 lb)   SpO2 96%   BMI 25.73 kg/m   Intake/Output Summary (Last 24 hours) at 03/10/16 1032 Last data filed at 03/10/16 0815  Gross per 24 hour  Intake             1130 ml  Output             1200 ml  Net              -70 ml   Filed Weights   03/09/16 1432  Weight: 88.5 kg (195 lb)    Exam:   General:  NAD  Cardiovascular: RRR  Respiratory: CTABL  Abdomen: Soft/ND/NT, positive BS  Musculoskeletal: No Edema  Neuro: aaox3  Data Reviewed: Basic Metabolic Panel:  Recent Labs Lab 03/09/16 1456 03/10/16 0759  NA 138 139  K 4.1 4.0  CL 109 113*  CO2 21* 21*  GLUCOSE 158* 108*  BUN 63* 56*  CREATININE 1.60* 1.33*  CALCIUM 8.6* 8.2*  MG 2.0  --    Liver Function Tests:  Recent Labs Lab 03/09/16 1456  AST 22  ALT 18  ALKPHOS 40  BILITOT 0.1*  PROT 6.0*  ALBUMIN 3.0*   No  results for input(s): LIPASE, AMYLASE in the last 168 hours. No results for input(s): AMMONIA in the last 168 hours. CBC:  Recent Labs Lab 03/09/16 1456 03/09/16 1938 03/10/16 0759  WBC 13.2*  --  13.7*  NEUTROABS 10.2*  --   --   HGB 6.2* 5.8* 7.7*  HCT 19.1* 17.4* 22.7*  MCV 99.0  --  91.9  PLT 413*  --  313   Cardiac Enzymes:    Recent Labs Lab 03/09/16 1456  TROPONINI <0.03   BNP (last 3 results)  Recent Labs  03/09/16 1456  BNP 21.9    ProBNP (last 3 results)  Recent Labs  02/27/16 1031  PROBNP 37.0    CBG:  Recent Labs Lab 03/05/16 1341  GLUCAP 121*    No results found for this or any previous visit (from the past 240 hour(s)).   Studies: Dg Chest 2 View  Result Date: 03/09/2016 CLINICAL DATA:  Shortness of Breath, lung cancer EXAM: CHEST  2 VIEW COMPARISON:  03/05/2016  FINDINGS: Cardiomediastinal silhouette is stable. Persistent left hilar prominence. Persistent left perihilar interstitial prominence probable interstitial tumor spread or pneumonitis. Peripheral consolidation in left upper lobe is stable. Mild hyperinflation. Right lung is clear. Stable sclerotic lesion in right fourth rib. IMPRESSION: Persistent left hilar prominence. Persistent left perihilar interstitial prominence probable interstitial tumor spread or pneumonitis. Peripheral consolidation in left upper lobe is stable. Mild hyperinflation. Right lung is clear. Electronically Signed   By: Lahoma Crocker M.D.   On: 03/09/2016 15:21    Scheduled Meds: . sodium chloride   Intravenous Once  . sodium chloride   Intravenous Once  . feeding supplement (ENSURE ENLIVE)  237 mL Oral BID BM  . mouth rinse  15 mL Mouth Rinse BID  . metoprolol tartrate  12.5 mg Oral BID  . pantoprazole  40 mg Oral BID  . sodium chloride flush  3 mL Intravenous Q12H    Continuous Infusions:    Time spent: 58mns  Lasheba Stevens MD, PhD  [facility] Pager [phone] If 7PM-7AM, please contact night-coverage at [email or web address], password [facility]10/23/2017, 10:32 AM  LOS: 1 day

## 2016-03-10 NOTE — Consult Note (Addendum)
Marland Kitchen    HEMATOLOGY/ONCOLOGY CONSULTATION NOTE  Date of Service: 03/10/2016  Patient Care Team: Marin Olp, MD as PCP - General (Family Medicine) Latanya Maudlin, MD as Consulting Physician (Orthopedic Surgery)  CHIEF COMPLAINTS/PURPOSE OF CONSULTATION:  Newly diagnosed likely metastatic lung cancer  HISTORY OF PRESENTING ILLNESS:   Tim Ross is a wonderful 81 y.o. male who has been referred to [place] by Dr .Florencia Reasons, MD for evaluation and management of likely metastatic lung cancer.  Patient is a generally healthy 81 year old Caucasian male with a history of hypertension, dyslipidemia, GERD who had an excellent performance status ECOG PS 0-1 who presented to his primary care physician with acute onset shortness of breath and dyspnea on exertion about 2 weeks ago.  He had a CTA of the chest on 02/29/2016 which showed 2 or 3 small pulmonary emboli and a left upper lobe peripheral opacity with several scattered nodules throughout the left upper lobe of the lung suggestive of satellite nodules and lymphangitic spread of tumor. He was also noted to have new adenopathy in the mediastinum and left hilum and a suspected lytic lesion in the right humeral head and a left adrenal nodule which was indeterminate.  Patient subsequently had a PET/CT scan on 03/05/2016 which showed Primarily left upper lobe infiltrative hypermetabolic mass with extensive mediastinal and left hilar hypermetabolic adenopathy, osseous metastatic lesions to the right humeral head and left T10 vertebra, and possible metastatic lesion of the left adrenal gland with an adrenal adenoma.  Patient was started on Xarelto for his pulmonary embolisms but unfortunately [facility] [place] [facility]/2016 with melena and acute blood loss anemia with a hemoglobin down to 5.8 due to GI bleeding. Patient was transfused 2 units of PRBCs overnight and is receiving his third one now.  He has been in relative by  gastroenterology and is due to get an EGD tomorrow for workup of his GI bleed. He has been taken off his Xarelto at this time.  We were consulted to further evaluate his metastatic neoplastic process. Patient has not had a tissue diagnosis for biopsy at this time.  Given the abnormality in his prostate on the PET/CT scan he had a PSA level done on 03/10/2016 which was within normal limits at 1.13. PET CT scan had shown some abnormalities concerning for subdural hematoma. Patient has had an MRI of the brain done today which shows no evidence of intracranial metastases with a small right frontal hygroma.  Patient notes that he feels much better after the transfusions. He is still having some low-volume melena today. No other acute new symptoms.  Patient notes significant right shoulder pain due to metastatic disease.   MEDICAL HISTORY:  Past Medical History:  Diagnosis Date  . Cancer ([place])   . Colonic polyp   . GERD (gastroesophageal reflux disease)   . Hyperlipidemia   . Hypertension   . Muscular degeneration   . Pneumonia 2002  . Testicular cyst    removed-noncancerous    SURGICAL HISTORY: Past Surgical History:  Procedure Laterality Date  . CATARACT EXTRACTION     right side  . KNEE SURGERY     scope on both  . LUMBAR LAMINECTOMY    . TRANSURETHRAL RESECTION OF PROSTATE     resolved issues    SOCIAL HISTORY: Social History   Social History  . Marital status: Married    Spouse name: N/A  . Number of children: N/A  . Years of education: N/A  Occupational History  . Not on file.   Social History Main Topics  . Smoking status: Former Smoker    Packs/day: 1.50    Years: 40.00    Types: Cigarettes    Quit date: 12/30/1990  . Smokeless tobacco: Never Used  . Alcohol use No  . Drug use: No  . Sexual activity: Not on file   Other Topics Concern  . Not on file   Social History Narrative   Married (2nd marriage-81). No children. Dog.       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       Hobbies: tv, walks daily or every other day    FAMILY HISTORY: Family History  Problem Relation Age of Onset  . Diabetes Mother   . Hypertension Mother   . Heart attack Father     late 47s, former smoker  . Heart disease Brother     54s, smokers    ALLERGIES:  is allergic to celebrex [celecoxib].  MEDICATIONS:  Current Facility-Administered Medications  Medication Dose Route Frequency Provider Last Rate Last Dose  . 0.9 %  sodium chloride infusion   Intravenous Once Florencia Reasons, MD      . 0.9 %  sodium chloride infusion   Intravenous Once Levin Erp, [place]      . feeding supplement (ENSURE ENLIVE) (ENSURE ENLIVE) liquid 237 mL  237 mL Oral BID BM Florencia Reasons, MD      . MEDLINE mouth rinse  15 mL Mouth Rinse BID Florencia Reasons, MD   15 mL at 03/10/16 1011  . metoprolol tartrate (LOPRESSOR) tablet 12.5 mg  12.5 mg Oral BID Florencia Reasons, MD   12.5 mg at 03/10/16 1010  . pantoprazole (PROTONIX) EC tablet 40 mg  40 mg Oral BID Lavone Nian [place], [place]   40 mg at 03/10/16 1010  . sodium chloride flush (NS) 0.9 % injection 3 mL  3 mL Intravenous Q12H Florencia Reasons, MD   3 mL at 03/10/16 1013    REVIEW OF SYSTEMS:    10 Point review of Systems was done is negative except as noted above.  PHYSICAL EXAMINATION: ECOG PERFORMANCE STATUS: 1 - Symptomatic but completely ambulatory  . Vitals:   03/10/16 0113 03/10/16 0422  BP: (!) 122/59 121/61  Pulse: 80 83  Resp: 16 16  Temp: 98.2 F (36.8 C) 98.3 F (36.8 C)   Filed Weights   03/09/16 1432  Weight: 195 lb (88.5 kg)   .Body mass index is 25.73 kg/m.  GENERAL:alert, in no acute distress and comfortable SKIN: skin color, texture, turgor are normal, no rashes or significant lesions EYES: normal, conjunctiva are pink and non-injected, sclera clear OROPHARYNX:no exudate, no erythema and lips, buccal mucosa, and tongue normal  NECK: supple, no JVD, thyroid normal size, non-tender, without  nodularity LYMPH:  no palpable lymphadenopathy in the cervical, axillary or inguinal LUNGS: clear to auscultation with normal respiratory effort HEART: regular rate & rhythm,  no murmurs and no lower extremity edema ABDOMEN: abdomen soft, non-tender, normoactive bowel sounds  Musculoskeletal: no cyanosis of digits and no clubbing  PSYCH: alert & oriented x 3 with fluent speech NEURO: no focal motor/sensory deficits  LABORATORY DATA:  I have reviewed the data as listed  . CBC Latest Ref Rng & Units 03/10/2016 03/09/2016 03/09/2016  WBC 4.0 - 10.5 K/uL 13.7(H) - 13.2(H)  Hemoglobin 13.0 - 17.0 g/dL 7.7(L) 5.8(LL) 6.2(LL)  Hematocrit 39.0 - 52.0 % 22.7(L) 17.4(L) 19.1(L)  Platelets  150 - 400 K/uL 313 - 413(H)    . CMP Latest Ref Rng & Units 03/10/2016 03/09/2016 02/27/2016  Glucose 65 - 99 mg/dL 108(H) 158(H) 91  BUN 6 - 20 mg/dL 56(H) 63(H) 24(H)  Creatinine 0.61 - 1.24 mg/dL 1.33(H) 1.60(H) 1.32  Sodium 135 - 145 mmol/L 139 138 139  Potassium 3.5 - 5.1 mmol/L 4.0 4.1 4.8  Chloride 101 - 111 mmol/L 113(H) 109 104  CO2 22 - 32 mmol/L 21(L) 21(L) 27  Calcium 8.9 - 10.3 mg/dL 8.2(L) 8.6(L) 9.3  Total Protein 6.5 - 8.1 g/dL - 6.0(L) 7.2  Total Bilirubin 0.3 - 1.2 mg/dL - 0.1(L) 0.4  Alkaline Phos 38 - 126 U/L - 40 53  AST 15 - 41 U/L - 22 20  ALT 17 - 63 U/L - 18 17     RADIOGRAPHIC STUDIES: I have personally reviewed the radiological images as listed and agreed with the findings in the report. Dg Chest 2 View  Result Date: 03/09/2016 CLINICAL DATA:  Shortness of Breath, lung cancer EXAM: CHEST  2 VIEW COMPARISON:  03/05/2016 FINDINGS: Cardiomediastinal silhouette is stable. Persistent left hilar prominence. Persistent left perihilar interstitial prominence probable interstitial tumor spread or pneumonitis. Peripheral consolidation in left upper lobe is stable. Mild hyperinflation. Right lung is clear. Stable sclerotic lesion in right fourth rib. IMPRESSION: Persistent left hilar  prominence. Persistent left perihilar interstitial prominence probable interstitial tumor spread or pneumonitis. Peripheral consolidation in left upper lobe is stable. Mild hyperinflation. Right lung is clear. Electronically Signed   By: Lahoma Crocker M.D.   On: 03/09/2016 15:21   Dg Chest 2 View  Result Date: 02/27/2016 CLINICAL DATA:  Short of breath on exertion. Symptoms for 2 months intermittently. History of hypertension. EXAM: CHEST  2 VIEW COMPARISON:  10/17/2014 FINDINGS: There is left sided irregular interstitial thickening with a focal area of opacity which projects in the left upper lobe lateral to the left hilum. This is new since the prior exam. Right lung is essentially clear and unchanged from the prior study. Lungs are hyperexpanded with flattened hemidiaphragms consistent with COPD. Minimal left pleural effusion is suggested. No pneumothorax. Cardiac silhouette is normal in size. No mediastinal or hilar masses or convincing adenopathy. Skeletal structures are demineralized but intact. IMPRESSION: 1. Irregularly thickened interstitial markings on the left with an ill-defined focal opacity noted in the left upper lobe, new from the prior study. No acute findings in the right lung. Findings may reflect asymmetric interstitial edema or interstitial infection. Neoplastic disease is not excluded given the focal opacity in the left upper lobe. Consider follow-up chest CT with contrast. 2. Underlying COPD.  No acute findings in the right lung. Electronically Signed   By: Lajean Manes M.D.   On: 02/27/2016 13:32   Ct Angio Chest Pe W Or Wo Contrast  Addendum Date: 02/29/2016   ADDENDUM REPORT: 02/29/2016 21:09 ADDENDUM: I spoke to Dr. Elease Hashimoto. Electronically Signed   By: Dorise Bullion III M.D   On: 02/29/2016 21:09   Addendum Date: 02/29/2016   ADDENDUM REPORT: 02/29/2016 21:06 ADDENDUM: Findings called to the patient's physician. Electronically Signed   By: Dorise Bullion III M.D   On:  02/29/2016 21:06   Result Date: 02/29/2016 CLINICAL DATA:  Shortness of breath. Elevated D-dimer. No history of cancer. EXAM: CT ANGIOGRAPHY CHEST WITH CONTRAST TECHNIQUE: Multidetector CT imaging of the chest was performed using the standard protocol during bolus administration of intravenous contrast. Multiplanar CT image reconstructions and MIPs were obtained to  evaluate the vascular anatomy. CONTRAST:  64 mL of Isovue 370 COMPARISON:  CT scan Oct 17, 2014 and chest x-ray February 27, 2016 FINDINGS: Cardiovascular: The thoracic aorta is normal in caliber. No dissection. There is new adenopathy in the mediastinum, particularly to the left. An AP window node on image 46 measures 2.3 by 3.3 cm. There are also some abnormal lymph nodes, larger in the interval, to the right of the trachea. The right hilum is spared but there is bulky adenopathy in the left hilum. No adenopathy in the bilateral axilla. There is a borderline node, larger in the interval, in the base of the neck on the left. The timing of contrast was mildly limited limiting evaluation of the pulmonary arteries beyond the segmental branch level. Within this limitation, there appear to be a few small pulmonary emboli. The best example is in the right upper lobe on series 9, image 97. I suspect another in the right lower lobe on image 154. The left-sided pulmonary arteries are surrounded by adenopathy. Very tiny embolus may be seen in the left lower lobe on axial image 142 no other emboli definitively identified. Mediastinum/Nodes: Mediastinal and left hilar adenopathy as above. The trachea and mainstem bronchi are normal. There is a small left effusion. No right effusion. There is a small anterior pericardial effusion. Lungs/Pleura: Scarring is seen in the medial right apex with a small calcified granuloma in the right apex. A few scattered tiny nodules on the right are stable and too small to characterize. No definitive new nodules on the right. The  nodule in the right upper lobe on the previous study has resolved. There is a peripheral opacity in the left lung measuring 3.8 x 2.2 cm on image 29. Interlobular septal thickening and ground-glass opacity surrounds this abnormality. There is also extension towards the hila as seen on image 31. There is a nodule in the lingula on image 40 which is new measuring 7 mm. A few other scattered nodules are seen in the lingula, also new. There is a small anterior pericardial effusion Upper Abdomen: There is a stable left adrenal nodule which is heterogeneous measuring 3.4 by 3.2 cm today versus 3.2 by 3.2 cm previously, not significantly changed. The attenuation is up to 15 Hounsfield units. The right adrenal gland is normal. Renal cysts are noted. No other abnormalities in the upper abdomen. Musculoskeletal: Suggested lytic lesion in thea right humeral head. No other evidence of bony metastatic disease. Review of the MIP images confirms the above findings. IMPRESSION: 1. Two or 3 small pulmonary emboli. 2. There is a peripheral opacity in the left upper lobe with surrounding interlobular septal thickening and ground-glass opacity in addition to several scattered nodules throughout the left upper lobe. These findings are new. While an infectious etiology is possible, the findings are highly suggestive of malignancy with satellite nodules and lymphangitic spread of tumor. 3. New adenopathy in the mediastinum and left hilum. 4. Suspected lytic lesion in the right humeral head. 5. Left adrenal nodule. While the stability in size is reassuring, the left adrenal nodule is indeterminate on this study. Findings being called to referring physician. Electronically Signed: By: Dorise Bullion III M.D On: 02/29/2016 16:14   Mr Brain W Wo Contrast  Result Date: 03/10/2016 CLINICAL DATA:  Metastatic disease.  Brain staging. EXAM: MRI HEAD WITHOUT AND WITH CONTRAST TECHNIQUE: Multiplanar, multiecho pulse sequences of the brain and  surrounding structures were obtained without and with intravenous contrast. CONTRAST:  54m MULTIHANCE GADOBENATE DIMEGLUMINE  529 MG/ML IV SOLN COMPARISON:  None. FINDINGS: Brain: No abnormal intracranial enhancement to suggest metastatic disease. The right frontal extra-axial low-density appearance on previous PET-CT reflects a small hygroma without significant mass effect. Small hygroma also present around the right cerebellum. Age congruent brain volume and white matter appearance. No hydrocephalus. Vascular: Normal flow voids. Skull and upper cervical spine: No evidence of metastasis Sinuses/Orbits: Bilateral cataract resection. IMPRESSION: Negative for intracranial metastasis. The right frontal finding on previous PET-CT is a small hygroma. Electronically Signed   By: Monte Fantasia M.D.   On: 03/10/2016 11:15   Nm Pet Image Initial (pi) Skull Base To Thigh  Result Date: 03/05/2016 CLINICAL DATA:  Initial treatment strategy for lung nodule. EXAM: NUCLEAR MEDICINE PET SKULL BASE TO THIGH TECHNIQUE: 12.3 mCi F-18 FDG was injected intravenously. Full-ring PET imaging was performed from the skull base to thigh after the radiotracer. CT data was obtained and used for attenuation correction and anatomic localization. COMPARISON:  Multiple exams, including 02/29/2016 FINDINGS: NECK Please note that inadvertently the entire head was included. There is asymmetric hypodensity along the right frontal subdural space which could be from subdural hygroma or chronic subdural hematoma, MRI of the brain with and without contrast should be considered for further assessment. I do not see a large mass in the brain on the PET images. There is pathologic supraclavicular adenopathy with a 9 mm left supraclavicular node on image 78 78/4 having a maximum standard uptake value of 10.1 and a cluster of small right supraclavicular nodes having a maximum standard uptake value of 8.5 CHEST There is pathologic upper paratracheal, lower  paratracheal, AP window, prevascular, and subcarinal hypermetabolic adenopathy in addition to left hilar and infrahilar adenopathy in the chest. Index AP window lymph node 1.5 cm in short axis with maximum standard uptake value 25.5. Subcarinal lymph node 2.0 cm in short axis with maximum standard uptake value 19.0. There is patchy airspace opacity with nodularity in the left upper lobe which has significantly hypermetabolic components, maximum standard uptake value 24.0 faintly hypermetabolic activity in the left lower lobe, from inflammation or possibly lymphangitic spread. Trace left pleural effusion, nonspecific for malignancy. Severe emphysema. Coronary, aortic arch, and branch vessel atherosclerotic vascular disease. ABDOMEN/PELVIS The left adrenal gland primarily has low density characteristics typical for adenoma, with central density measurement of 1 Hounsfield unit. However, there is marginal high activity along the left side of the adrenal gland with maximum SUV up to 8.7, raise the possibility of a collision lesion with early metastatic disease to the left adrenal gland. Within the prostate gland, there is a central focus of high activity with maximum SUV of 19.15. All this could possibly be from prostatic urethral urine activity rather than tumor, there is also a left anterior prostate base focus of accentuated activity with maximum and standard uptake value of 17.7. Appearance concerning for prostate malignancy. Suspected left scrotal hydrocele. Bilateral renal cysts. Sigmoid colon diverticulosis. SKELETON 5 cm metastatic lesion in the right proximal humerus mainly involving the humeral head, maximum SUV 49.6. Lucent metastatic lesion of the left T10 vertebra, maximum SUV 36.7. IMPRESSION: 1. Primarily left upper lobe infiltrative hypermetabolic mass with extensive mediastinal and left hilar hypermetabolic adenopathy, osseous metastatic lesions to the right humeral head and left T10 vertebra, and  possible collision metastatic lesion of the left adrenal gland with an adrenal adenoma. 2. Central and left anterior base hypermetabolic activity in the prostate gland, concerning for prostate cancer. 3. Asymmetric extra-axial hypodensity along the right frontal  lobe, possibly a chronic subdural hematoma or cystic hygroma, under the circumstances I recommend MRI of the brain with and without contrast for further workup and to assess for intracranial metastatic disease. Other imaging findings of potential clinical significance: Severe emphysema. Coronary, aortic arch, and branch vessel atherosclerotic vascular disease. Aortoiliac atherosclerotic vascular disease. Bilateral renal cysts. Sigmoid colon diverticulosis. Left scrotal hydrocele. Electronically Signed   By: Van Clines M.D.   On: 03/05/2016 15:34    ASSESSMENT & PLAN:    81 year old Caucasian male with history of smoking about 60 pack years with   #1 metastatic left upper lobe lung cancer with mediastinal metastases and metastasis to the right humeral head and T10 vertebra . PET/CT scan as noted above . MRI brain negative for metastatic disease  Plan  -Ultrasound-guided biopsy of supraclavicular or lower cervical lymph node if possible . -If ultrasound and biopsy is not possible might need to consider pulmonology consultation for bronchoscopic biopsy while the patient is off anticoagulation due to GI bleeding . -We will likely need Xgeva for bony metastases -I reviewed all information with the patient his daughter and wife. -I also reviewed the actual PET CT scan images with them.  #2 right humeral head metastasis . Plan  -Consider shoulder sling for pain control . -When necessary could oxycodone ordered for neoplasm related pain . -Depending on final tissue diagnosis and selected systemic treatment option might be to consider palliative radiation therapy to the right humeral metastatic process to help with pain management  .  #3  recent acute pulmonary embolism likely related to the patient's metastatic lung cancer  -Patient was on Xarelto which is on hold due to acute GI bleed . -Would consider ultrasound of the lower extremities to rule out high-risk blood clots that might cause significant new pulmonary embolism . -Would hope to restart anticoagulation once GI bleeding issues has been resolved .  #4  acute GI bleeding with melena .likely upper GI . Plan  -Transfuse when necessary to keep hemoglobin close to 9 . -Currently patient is on blood thinners . -Seen by gastroenterology and has been scheduled for EGD tomorrow .  We shall continue to follow peripherally pending final biopsy results .  Patient would need to follow-up with Dr. Irene Limbo in about 5-7 days after hospital discharge with CBC, CMP .  All of the patients questions were answered with apparent satisfaction. The patient knows to call the clinic with any problems, questions or concerns.  I spent 70 minutes counseling the patient face to face. The total time spent in the appointment was 80 minutes and more than 50% was on counseling and direct patient cares and coordination of care with the hospitalist team .    Sullivan Lone MD [place] AAHIVMS [facility] Hematology/Oncology Physician [place]  (Office):       [phone] (Work cell):  [phone] (Fax):           [phone]  03/10/2016 12:26 PM

## 2016-03-10 NOTE — Progress Notes (Signed)
Received 2 units of PRBC.  Awaiting post H & H.  No stools this shift.

## 2016-03-11 ENCOUNTER — Encounter (HOSPITAL_COMMUNITY): Payer: Self-pay | Admitting: Radiology

## 2016-03-11 ENCOUNTER — Inpatient Hospital Stay (HOSPITAL_COMMUNITY): Payer: Medicare Other | Admitting: Registered Nurse

## 2016-03-11 ENCOUNTER — Encounter (HOSPITAL_COMMUNITY)
Admission: EM | Disposition: A | Discharge status: Home / Self Care | Payer: Self-pay | Source: Home / Self Care | Attending: Family Medicine

## 2016-03-11 ENCOUNTER — Inpatient Hospital Stay (HOSPITAL_COMMUNITY): Payer: Medicare Other

## 2016-03-11 DIAGNOSIS — K5730 Diverticulosis of large intestine without perforation or abscess without bleeding: Secondary | ICD-10-CM

## 2016-03-11 DIAGNOSIS — K222 Esophageal obstruction: Secondary | ICD-10-CM

## 2016-03-11 HISTORY — PX: ESOPHAGOGASTRODUODENOSCOPY: SHX5428

## 2016-03-11 LAB — BASIC METABOLIC PANEL
Anion gap: 6 (ref 5–15)
BUN: 48 mg/dL — AB (ref 6–20)
CALCIUM: 8.2 mg/dL — AB (ref 8.9–10.3)
CO2: 22 mmol/L (ref 22–32)
CREATININE: 1.37 mg/dL — AB (ref 0.61–1.24)
Chloride: 111 mmol/L (ref 101–111)
GFR calc Af Amer: 55 mL/min — ABNORMAL LOW (ref >60)
GFR, EST NON AFRICAN AMERICAN: 47 mL/min — AB (ref >60)
Glucose, Bld: 119 mg/dL — ABNORMAL HIGH (ref 65–99)
POTASSIUM: 4 mmol/L (ref 3.5–5.1)
SODIUM: 139 mmol/L (ref 135–145)

## 2016-03-11 LAB — URINE CULTURE: Culture: NO GROWTH

## 2016-03-11 LAB — TYPE AND SCREEN
ABO/RH(D): A NEG
ANTIBODY SCREEN: NEGATIVE
UNIT DIVISION: 0
Unit division: 0
Unit division: 0

## 2016-03-11 LAB — HEMOGLOBIN
HEMOGLOBIN: 8.1 g/dL — AB (ref 13.0–17.0)
HEMOGLOBIN: 8.1 g/dL — AB (ref 13.0–17.0)
Hemoglobin: 8.7 g/dL — ABNORMAL LOW (ref 13.0–17.0)
Hemoglobin: 8.2 g/dL — ABNORMAL LOW (ref 13.0–17.0)

## 2016-03-11 SURGERY — EGD (ESOPHAGOGASTRODUODENOSCOPY)
Anesthesia: Monitor Anesthesia Care

## 2016-03-11 MED ORDER — PROPOFOL 10 MG/ML IV BOLUS
INTRAVENOUS | Status: AC
  Filled 2016-03-11: qty 40

## 2016-03-11 MED ORDER — PROPOFOL 10 MG/ML IV BOLUS
INTRAVENOUS | Status: DC | PRN
  Administered 2016-03-11 (×4): 20 mg via INTRAVENOUS
  Administered 2016-03-11: 10 mg via INTRAVENOUS

## 2016-03-11 MED ORDER — SENNOSIDES-DOCUSATE SODIUM 8.6-50 MG PO TABS
1.0000 | ORAL_TABLET | Freq: Two times a day (BID) | ORAL | Status: DC
  Administered 2016-03-12 – 2016-03-15 (×7): 1 via ORAL
  Filled 2016-03-11 (×8): qty 1

## 2016-03-11 NOTE — Progress Notes (Signed)
Chief Complaint: Patient was seen in consultation today for LN biopsy at the request of Dr. Irene Limbo  Referring Physician(s): Dr. Sullivan Lone  Supervising Physician: Aletta Edouard  Patient Status: [facility]  History of Present Illness: Tim Ross is a 81 y.o. male with newly found probable metastatic lung cancer. He has pulmonary lesions with hypermetabolic lesions concerning for mets. He needs tissue diagnosis. IR is asked to obtain biopsy of LN if possible. Chart reviewed. Pt also recently diagnosed with PE and placed on Xarelto, but has developed GI bleed, so Xarelto was stopped a few days ago. He is for EGD this afternoon. Got PRBC transfusion and is feeling better. Family at bedside. Imaging reviewed with Dr. Kathlene Cote   Past Medical History:  Diagnosis Date  . Cancer ([place])   . Colonic polyp   . GERD (gastroesophageal reflux disease)   . Hyperlipidemia   . Hypertension   . Muscular degeneration   . Pneumonia 2002  . Testicular cyst    removed-noncancerous    Past Surgical History:  Procedure Laterality Date  . CATARACT EXTRACTION     right side  . KNEE SURGERY     scope on both  . LUMBAR LAMINECTOMY    . TRANSURETHRAL RESECTION OF PROSTATE     resolved issues    Allergies: Celebrex [celecoxib]  Medications:  Current Facility-Administered Medications:  .  0.9 %  sodium chloride infusion, , Intravenous, Continuous, Lavone Nian [place], [place], Last Rate: 20 mL/hr at 03/10/16 2144 .  acetaminophen (TYLENOL) tablet 650 mg, 650 mg, Oral, Q6H PRN, Florencia Reasons, MD, 650 mg at 03/10/16 1820 .  feeding supplement (ENSURE ENLIVE) (ENSURE ENLIVE) liquid 237 mL, 237 mL, Oral, BID BM, Florencia Reasons, MD, 237 mL at 03/10/16 1514 .  MEDLINE mouth rinse, 15 mL, Mouth Rinse, BID, Florencia Reasons, MD, 15 mL at 03/11/16 1000 .  metoprolol tartrate (LOPRESSOR) tablet 12.5 mg, 12.5 mg, Oral, BID, Florencia Reasons, MD, 12.5 mg at 03/11/16 0909 .  oxyCODONE (ROXICODONE) 5 MG/5ML solution 2.5-5 mg of  oxycodone, 2.5-5 mg of oxycodone, Oral, Q6H PRN, Brunetta Genera, MD, 5 mg of oxycodone at 03/11/16 1004 .  pantoprazole (PROTONIX) EC tablet 40 mg, 40 mg, Oral, BID, Lavone Nian [place], [place], 40 mg at 03/11/16 5956 .  [START ON 03/12/2016] senna-docusate (Senokot-S) tablet 1 tablet, 1 tablet, Oral, BID, Florencia Reasons, MD .  sodium chloride flush (NS) 0.9 % injection 3 mL, 3 mL, Intravenous, Q12H, Florencia Reasons, MD, 3 mL at 03/10/16 2144 .  zolpidem (AMBIEN) tablet 5 mg, 5 mg, Oral, QHS PRN, Florencia Reasons, MD    Family History  Problem Relation Age of Onset  . Diabetes Mother   . Hypertension Mother   . Heart attack Father     late 4s, former smoker  . Heart disease Brother     42s, smokers    Social History   Social History  . Marital status: Married    Spouse name: N/A  . Number of children: N/A  . Years of education: N/A   Social History Main Topics  . Smoking status: Former Smoker    Packs/day: 1.50    Years: 40.00    Types: Cigarettes    Quit date: 12/30/1990  . Smokeless tobacco: Never Used  . Alcohol use No  . Drug use: No  . Sexual activity: Not Asked   Other Topics Concern  . None   Social History Narrative   Married (2nd marriage-81). No children.  Dog.       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       Hobbies: tv, walks daily or every other day     Review of Systems: A 12 point ROS discussed and pertinent positives are indicated in the HPI above.  All other systems are negative.  Review of Systems  Vital Signs: BP (!) 110/57 (BP Location: Left Arm)   Pulse 87   Temp 98.4 F (36.9 C) (Oral)   Resp 18   Ht '6\' 1"'$  (1.854 m)   Wt 195 lb (88.5 kg)   SpO2 93%   BMI 25.73 kg/m   Physical Exam  Constitutional: He is oriented to person, place, and time. He appears well-developed and well-nourished. No distress.  HENT:  Head: Normocephalic.  Mouth/Throat: Oropharynx is clear and moist.  Neck: Normal range of motion. No JVD present. No  tracheal deviation present.  No palpable adenopathy  Cardiovascular: Normal rate, regular rhythm and normal heart sounds.   Pulmonary/Chest: Effort normal and breath sounds normal. No respiratory distress. He has no wheezes. He has no rales.  Neurological: He is alert and oriented to person, place, and time.  Skin: Skin is warm and dry.  Psychiatric: He has a normal mood and affect. Judgment normal.    Mallampati Score:  MD Evaluation Airway: WNL Heart: WNL Abdomen: WNL Chest/ Lungs: WNL ASA  Classification: 2 Mallampati/Airway Score: Two  Imaging: Dg Chest 2 View  Result Date: 03/09/2016 CLINICAL DATA:  Shortness of Breath, lung cancer EXAM: CHEST  2 VIEW COMPARISON:  03/05/2016 FINDINGS: Cardiomediastinal silhouette is stable. Persistent left hilar prominence. Persistent left perihilar interstitial prominence probable interstitial tumor spread or pneumonitis. Peripheral consolidation in left upper lobe is stable. Mild hyperinflation. Right lung is clear. Stable sclerotic lesion in right fourth rib. IMPRESSION: Persistent left hilar prominence. Persistent left perihilar interstitial prominence probable interstitial tumor spread or pneumonitis. Peripheral consolidation in left upper lobe is stable. Mild hyperinflation. Right lung is clear. Electronically Signed   By: Lahoma Crocker M.D.   On: 03/09/2016 15:21   Dg Chest 2 View  Result Date: 02/27/2016 CLINICAL DATA:  Short of breath on exertion. Symptoms for 2 months intermittently. History of hypertension. EXAM: CHEST  2 VIEW COMPARISON:  10/17/2014 FINDINGS: There is left sided irregular interstitial thickening with a focal area of opacity which projects in the left upper lobe lateral to the left hilum. This is new since the prior exam. Right lung is essentially clear and unchanged from the prior study. Lungs are hyperexpanded with flattened hemidiaphragms consistent with COPD. Minimal left pleural effusion is suggested. No pneumothorax.  Cardiac silhouette is normal in size. No mediastinal or hilar masses or convincing adenopathy. Skeletal structures are demineralized but intact. IMPRESSION: 1. Irregularly thickened interstitial markings on the left with an ill-defined focal opacity noted in the left upper lobe, new from the prior study. No acute findings in the right lung. Findings may reflect asymmetric interstitial edema or interstitial infection. Neoplastic disease is not excluded given the focal opacity in the left upper lobe. Consider follow-up chest CT with contrast. 2. Underlying COPD.  No acute findings in the right lung. Electronically Signed   By: Lajean Manes M.D.   On: 02/27/2016 13:32   Ct Angio Chest Pe W Or Wo Contrast  Addendum Date: 02/29/2016   ADDENDUM REPORT: 02/29/2016 21:09 ADDENDUM: I spoke to Dr. Elease Hashimoto. Electronically Signed   By: Dorise Bullion III M.D   On: 02/29/2016 21:09  Addendum Date: 02/29/2016   ADDENDUM REPORT: 02/29/2016 21:06 ADDENDUM: Findings called to the patient's physician. Electronically Signed   By: Dorise Bullion III M.D   On: 02/29/2016 21:06   Result Date: 02/29/2016 CLINICAL DATA:  Shortness of breath. Elevated D-dimer. No history of cancer. EXAM: CT ANGIOGRAPHY CHEST WITH CONTRAST TECHNIQUE: Multidetector CT imaging of the chest was performed using the standard protocol during bolus administration of intravenous contrast. Multiplanar CT image reconstructions and MIPs were obtained to evaluate the vascular anatomy. CONTRAST:  64 mL of Isovue 370 COMPARISON:  CT scan Oct 17, 2014 and chest x-ray February 27, 2016 FINDINGS: Cardiovascular: The thoracic aorta is normal in caliber. No dissection. There is new adenopathy in the mediastinum, particularly to the left. An AP window node on image 46 measures 2.3 by 3.3 cm. There are also some abnormal lymph nodes, larger in the interval, to the right of the trachea. The right hilum is spared but there is bulky adenopathy in the left hilum. No  adenopathy in the bilateral axilla. There is a borderline node, larger in the interval, in the base of the neck on the left. The timing of contrast was mildly limited limiting evaluation of the pulmonary arteries beyond the segmental branch level. Within this limitation, there appear to be a few small pulmonary emboli. The best example is in the right upper lobe on series 9, image 97. I suspect another in the right lower lobe on image 154. The left-sided pulmonary arteries are surrounded by adenopathy. Very tiny embolus may be seen in the left lower lobe on axial image 142 no other emboli definitively identified. Mediastinum/Nodes: Mediastinal and left hilar adenopathy as above. The trachea and mainstem bronchi are normal. There is a small left effusion. No right effusion. There is a small anterior pericardial effusion. Lungs/Pleura: Scarring is seen in the medial right apex with a small calcified granuloma in the right apex. A few scattered tiny nodules on the right are stable and too small to characterize. No definitive new nodules on the right. The nodule in the right upper lobe on the previous study has resolved. There is a peripheral opacity in the left lung measuring 3.8 x 2.2 cm on image 29. Interlobular septal thickening and ground-glass opacity surrounds this abnormality. There is also extension towards the hila as seen on image 31. There is a nodule in the lingula on image 40 which is new measuring 7 mm. A few other scattered nodules are seen in the lingula, also new. There is a small anterior pericardial effusion Upper Abdomen: There is a stable left adrenal nodule which is heterogeneous measuring 3.4 by 3.2 cm today versus 3.2 by 3.2 cm previously, not significantly changed. The attenuation is up to 15 Hounsfield units. The right adrenal gland is normal. Renal cysts are noted. No other abnormalities in the upper abdomen. Musculoskeletal: Suggested lytic lesion in thea right humeral head. No other  evidence of bony metastatic disease. Review of the MIP images confirms the above findings. IMPRESSION: 1. Two or 3 small pulmonary emboli. 2. There is a peripheral opacity in the left upper lobe with surrounding interlobular septal thickening and ground-glass opacity in addition to several scattered nodules throughout the left upper lobe. These findings are new. While an infectious etiology is possible, the findings are highly suggestive of malignancy with satellite nodules and lymphangitic spread of tumor. 3. New adenopathy in the mediastinum and left hilum. 4. Suspected lytic lesion in the right humeral head. 5. Left adrenal nodule. While  the stability in size is reassuring, the left adrenal nodule is indeterminate on this study. Findings being called to referring physician. Electronically Signed: By: Dorise Bullion III M.D On: 02/29/2016 16:14   Mr Brain W Wo Contrast  Result Date: 03/10/2016 CLINICAL DATA:  Metastatic disease.  Brain staging. EXAM: MRI HEAD WITHOUT AND WITH CONTRAST TECHNIQUE: Multiplanar, multiecho pulse sequences of the brain and surrounding structures were obtained without and with intravenous contrast. CONTRAST:  35m MULTIHANCE GADOBENATE DIMEGLUMINE 529 MG/ML IV SOLN COMPARISON:  None. FINDINGS: Brain: No abnormal intracranial enhancement to suggest metastatic disease. The right frontal extra-axial low-density appearance on previous PET-CT reflects a small hygroma without significant mass effect. Small hygroma also present around the right cerebellum. Age congruent brain volume and white matter appearance. No hydrocephalus. Vascular: Normal flow voids. Skull and upper cervical spine: No evidence of metastasis Sinuses/Orbits: Bilateral cataract resection. IMPRESSION: Negative for intracranial metastasis. The right frontal finding on previous PET-CT is a small hygroma. Electronically Signed   By: JMonte FantasiaM.D.   On: 03/10/2016 11:15   Nm Pet Image Initial (pi) Skull Base To  Thigh  Result Date: 03/05/2016 CLINICAL DATA:  Initial treatment strategy for lung nodule. EXAM: NUCLEAR MEDICINE PET SKULL BASE TO THIGH TECHNIQUE: 12.3 mCi F-18 FDG was injected intravenously. Full-ring PET imaging was performed from the skull base to thigh after the radiotracer. CT data was obtained and used for attenuation correction and anatomic localization. COMPARISON:  Multiple exams, including 02/29/2016 FINDINGS: NECK Please note that inadvertently the entire head was included. There is asymmetric hypodensity along the right frontal subdural space which could be from subdural hygroma or chronic subdural hematoma, MRI of the brain with and without contrast should be considered for further assessment. I do not see a large mass in the brain on the PET images. There is pathologic supraclavicular adenopathy with a 9 mm left supraclavicular node on image 78 78/4 having a maximum standard uptake value of 10.1 and a cluster of small right supraclavicular nodes having a maximum standard uptake value of 8.5 CHEST There is pathologic upper paratracheal, lower paratracheal, AP window, prevascular, and subcarinal hypermetabolic adenopathy in addition to left hilar and infrahilar adenopathy in the chest. Index AP window lymph node 1.5 cm in short axis with maximum standard uptake value 25.5. Subcarinal lymph node 2.0 cm in short axis with maximum standard uptake value 19.0. There is patchy airspace opacity with nodularity in the left upper lobe which has significantly hypermetabolic components, maximum standard uptake value 24.0 faintly hypermetabolic activity in the left lower lobe, from inflammation or possibly lymphangitic spread. Trace left pleural effusion, nonspecific for malignancy. Severe emphysema. Coronary, aortic arch, and branch vessel atherosclerotic vascular disease. ABDOMEN/PELVIS The left adrenal gland primarily has low density characteristics typical for adenoma, with central density measurement of 1  Hounsfield unit. However, there is marginal high activity along the left side of the adrenal gland with maximum SUV up to 8.7, raise the possibility of a collision lesion with early metastatic disease to the left adrenal gland. Within the prostate gland, there is a central focus of high activity with maximum SUV of 19.15. All this could possibly be from prostatic urethral urine activity rather than tumor, there is also a left anterior prostate base focus of accentuated activity with maximum and standard uptake value of 17.7. Appearance concerning for prostate malignancy. Suspected left scrotal hydrocele. Bilateral renal cysts. Sigmoid colon diverticulosis. SKELETON 5 cm metastatic lesion in the right proximal humerus mainly involving the humeral  head, maximum SUV 49.6. Lucent metastatic lesion of the left T10 vertebra, maximum SUV 36.7. IMPRESSION: 1. Primarily left upper lobe infiltrative hypermetabolic mass with extensive mediastinal and left hilar hypermetabolic adenopathy, osseous metastatic lesions to the right humeral head and left T10 vertebra, and possible collision metastatic lesion of the left adrenal gland with an adrenal adenoma. 2. Central and left anterior base hypermetabolic activity in the prostate gland, concerning for prostate cancer. 3. Asymmetric extra-axial hypodensity along the right frontal lobe, possibly a chronic subdural hematoma or cystic hygroma, under the circumstances I recommend MRI of the brain with and without contrast for further workup and to assess for intracranial metastatic disease. Other imaging findings of potential clinical significance: Severe emphysema. Coronary, aortic arch, and branch vessel atherosclerotic vascular disease. Aortoiliac atherosclerotic vascular disease. Bilateral renal cysts. Sigmoid colon diverticulosis. Left scrotal hydrocele. Electronically Signed   By: Van Clines M.D.   On: 03/05/2016 15:34    Labs:  CBC:  Recent Labs  11/08/15 1435  02/27/16 1031 03/09/16 1456 03/09/16 1938 03/10/16 0759 03/10/16 1910 03/11/16 0120 03/11/16 0651  WBC 8.5 8.8 13.2*  --  13.7*  --   --   --   HGB 13.4 12.3* 6.2* 5.8* 7.7* 8.6* 8.1* 8.1*  HCT 39.3 36.2* 19.1* 17.4* 22.7*  --   --   --   PLT 199 267.0 413*  --  313  --   --   --     COAGS:  Recent Labs  03/09/16 1938  INR 1.58    BMP:  Recent Labs  11/08/15 1435 02/27/16 1031 03/09/16 1456 03/10/16 0759 03/11/16 0120  NA 138 139 138 139 139  K 3.9 4.8 4.1 4.0 4.0  CL 104 104 109 113* 111  CO2 27 27 21* 21* 22  GLUCOSE 120* 91 158* 108* 119*  BUN 29* 24* 63* 56* 48*  CALCIUM 8.5* 9.3 8.6* 8.2* 8.2*  CREATININE 1.61* 1.32 1.60* 1.33* 1.37*  GFRNONAA 39*  --  39* 49* 47*  GFRAA 45*  --  46* 57* 55*    LIVER FUNCTION TESTS:  Recent Labs  11/08/15 1435 02/27/16 1031 03/09/16 1456  BILITOT 0.5 0.4 0.1*  AST '27 20 22  '$ ALT '18 17 18  '$ ALKPHOS 46 53 40  PROT 6.6 7.2 6.0*  ALBUMIN 3.9 3.7 3.0*    TUMOR MARKERS: No results for input(s): AFPTM, CEA, CA199, CHROMGRNA in the last 8760 hours.  Assessment and Plan: Lung nodules with PET findings concerning for metastatic process. PE, off anticoagulation due to GI bleed Cervical/supraclavicular LN are small, but can attempt US guided biopsy. If unable to see LN or perform safe bx, agree with recommendation for bronchoscopy. Risks and Benefits discussed with the patient including, but not limited to bleeding, infection, damage to adjacent structures or low yield requiring additional tests. All of the patient's questions were answered, patient is agreeable to proceed. Consent signed and in chart. Plan for tomorrow ~1100.   Thank you for this interesting consult.  I greatly enjoyed meeting Ahmir A Dethloff and look forward to participating in their care.  A copy of this report was sent to the requesting provider on this date.  Electronically Signed: Ascencion Dike 03/11/2016, 1:07 PM   I spent a total of 20  minutes in face to face in clinical consultation, greater than 50% of which was counseling/coordinating care for LN biopsy

## 2016-03-11 NOTE — Anesthesia Postprocedure Evaluation (Signed)
Anesthesia Post Note  Patient: Tim Ross  Procedure(s) Performed: Procedure(s) (LRB): ESOPHAGOGASTRODUODENOSCOPY (EGD) (N/A)  Patient location during evaluation: PACU Anesthesia Type: MAC Level of consciousness: awake and alert Pain management: pain level controlled Vital Signs Assessment: post-procedure vital signs reviewed and stable Respiratory status: spontaneous breathing, nonlabored ventilation, respiratory function stable and patient connected to nasal cannula oxygen Cardiovascular status: stable and blood pressure returned to baseline Anesthetic complications: no    Last Vitals:  Vitals:   03/11/16 1510 03/11/16 1525  BP: (!) 164/71 (!) 143/70  Pulse: 95 99  Resp: 20 20  Temp:  36.7 C    Last Pain:  Vitals:   03/11/16 1525  TempSrc: Oral  PainSc:                  Riccardo Dubin

## 2016-03-11 NOTE — H&P (View-Only) (Signed)
Consultation  Referring Provider:  Dr. Erlinda Hong    Primary Care Physician:  Garret Reddish, MD Primary Gastroenterologist:  Dr. Olevia Perches     Reason for Consultation:   Melena           HPI:   Tim Ross is a 81 y.o. Caucasian  male with a past medical history of hypertension, diverticulosis, GERD, hyperlipidemia and a recent diagnosis of PE and metastatic cancer with lung versus prostate primary metastases to bone, who presented to [facility] on 03/09/16 with a complaint of profound or shortness of breath and fatigue. He was transferred to [facility].  Today, the patient is surrounded by his wife and daughter who do assist with his history. He tells me that 2 weeks ago he became short of breath and was found to have blood clots in his lungs. At that time he was started on Xarelto, October 13, and had a PET scan which revealed lung cancer. The family has not seen oncology yet, but are awaiting their arrival later today. They explain that he had been short of breath since that time but yesterday this was so severe that he could "hardly walk 2 feet without having to sit down and rest". They brought him in to the ER. The patient tells me that he did have some black stool around 3-4 days ago, but believes this was only once and has had no bowel movement since then. He has had a decreased appetite over the past few weeks. He chronically takes Prilosec 20 mg daily for heartburn and reflux symptoms which he has been on for over 5 years. His last dose of Xarelto was 03/09/16 in the morning.  Overnight the patient tells me that he has had 2 units of blood and feels a lot better. He denies further bowel movements or other new symptoms. He is hungry.  Patient denies NSAID use, fever, chills, dysphagia, heartburn, reflux, nausea, vomiting, abdominal pain, diarrhea, constipation or previous episodes of the same.  ED course: Found to have an acute anemia with a hemoglobin at 6.2, white blood  cells 13.2, BUN 63, creatinine 1.6, and sinus tachycardia, heart rate around 110, BP stable, stool occult positive  Past Medical History:  Diagnosis Date  . Cancer ([place])   . Colonic polyp   . GERD (gastroesophageal reflux disease)   . Hyperlipidemia   . Hypertension   . Muscular degeneration   . Pneumonia 2002  . Testicular cyst    removed-noncancerous    Past Surgical History:  Procedure Laterality Date  . CATARACT EXTRACTION     right side  . KNEE SURGERY     scope on both  . LUMBAR LAMINECTOMY    . TRANSURETHRAL RESECTION OF PROSTATE     resolved issues    Family History  Problem Relation Age of Onset  . Diabetes Mother   . Hypertension Mother   . Heart attack Father     late 4s, former smoker  . Heart disease Brother     52s, smokers    Social History  Substance Use Topics  . Smoking status: Former Smoker    Packs/day: 1.50    Years: 40.00    Types: Cigarettes    Quit date: 12/30/1990  . Smokeless tobacco: Never Used  . Alcohol use No    Prior to Admission medications   Medication Sig Start Date End Date Taking? Authorizing Provider  CINNAMON PO Take 2 tablets by mouth every  morning.    Yes Historical Provider, MD  losartan (COZAAR) 25 MG tablet Take 1 tablet (25 mg total) by mouth daily. Patient taking differently: Take 25 mg by mouth every morning.  02/18/16  Yes Marin Olp, MD  Multiple Vitamin (MULTIVITAMIN WITH MINERALS) TABS tablet Take 1 tablet by mouth every morning.    Yes Historical Provider, MD  Multiple Vitamins-Minerals (ICAPS) CAPS Take 1 capsule by mouth 2 (two) times daily.   Yes Historical Provider, MD  Omega-3 Fatty Acids (FISH OIL PO) Take 1 capsule by mouth every morning.    Yes Historical Provider, MD  omeprazole (PRILOSEC) 20 MG capsule Take 1 capsule (20 mg total) by mouth daily. Patient taking differently: Take 20 mg by mouth every other day.  02/18/16  Yes Marin Olp, MD  Rivaroxaban (XARELTO) 15 MG TABS tablet Take 1  tablet (15 mg total) by mouth 2 (two) times daily with a meal. 02/29/16  Yes Eulas Post, MD  rivaroxaban (XARELTO) 20 MG TABS tablet Take 1 tablet (20 mg total) by mouth daily with supper. Start the day after you finish the 15 mg pills Patient not taking: Reported on 03/09/2016 03/04/16   Marin Olp, MD    Current Facility-Administered Medications  Medication Dose Route Frequency Provider Last Rate Last Dose  . 0.9 %  sodium chloride infusion   Intravenous Once Florencia Reasons, MD      . 0.9 %  sodium chloride infusion   Intravenous Once Levin Erp, [place]      . feeding supplement (ENSURE ENLIVE) (ENSURE ENLIVE) liquid 237 mL  237 mL Oral BID BM Florencia Reasons, MD      . MEDLINE mouth rinse  15 mL Mouth Rinse BID Florencia Reasons, MD   15 mL at 03/10/16 1011  . metoprolol tartrate (LOPRESSOR) tablet 12.5 mg  12.5 mg Oral BID Florencia Reasons, MD   12.5 mg at 03/10/16 1010  . pantoprazole (PROTONIX) EC tablet 40 mg  40 mg Oral BID Lavone Nian [place], [place]   40 mg at 03/10/16 1010  . sodium chloride flush (NS) 0.9 % injection 3 mL  3 mL Intravenous Q12H Florencia Reasons, MD   3 mL at 03/10/16 1013    Allergies as of 03/09/2016 - Review Complete 03/09/2016  Allergen Reaction Noted  . Celebrex [celecoxib] Hives 03/07/2013     Review of Systems:     Constitutional: Positive for weight loss, weakness and fatigue No fever or chills HEENT: Eyes: No change in vision               Ears, Nose, Throat:  No change in hearing or congestion Skin: No rash or itching Cardiovascular: No chest pain, chest pressure or palpitations   Respiratory: Positive for shortness of breath and dyspnea on exertion No cough Gastrointestinal: See HPI and otherwise negative Genitourinary: No dysuria or change in urinary frequency Neurological: No headache, dizziness or syncope Musculoskeletal: No new muscle or joint pain Hematologic: Positive for melena No bruising Psychiatric: No history of depression or anxiety    Physical Exam:    Vital signs in last 24 hours: Temp:  [97.8 F (36.6 C)-98.4 F (36.9 C)] 98.3 F (36.8 C) (10/23 0422) Pulse Rate:  [79-112] 83 (10/23 0422) Resp:  [16-29] 16 (10/23 0422) BP: (118-152)/(53-71) 121/61 (10/23 0422) SpO2:  [95 %-100 %] 96 % (10/23 0422) Weight:  [195 lb (88.5 kg)] 195 lb (88.5 kg) (10/22 1432) Last BM Date: 03/07/16 General:   Pleasant Caucasian  male appears to be in NAD, Well developed, Well nourished, alert and cooperative Head:  Normocephalic and atraumatic. Eyes:   PEERL, EOMI. No icterus. Conjunctiva pink. Ears:  Normal auditory acuity. Neck:  Supple Throat: Oral cavity and pharynx without inflammation, swelling or lesion.  Lungs: Respirations even and unlabored. Lungs clear to auscultation bilaterally.   No wheezes, crackles, or rhonchi.  Heart: Normal S1, S2. No MRG. Regular rate and rhythm. No peripheral edema, cyanosis or pallor.  Abdomen:  Soft, nondistended, nontender. No rebound or guarding. Normal bowel sounds. No appreciable masses or hepatomegaly. Rectal:  Not performed.  Msk:  Symmetrical without gross deformities.  Extremities:  Without edema, no deformity or joint abnormality.  Neurologic:  Alert and  oriented x4;  grossly normal neurologically.  Skin:   Dry and intact without significant lesions or rashes. Psychiatric: Oriented to person, place and time. Demonstrates good judgement and reason without abnormal affect or behaviors.   LAB RESULTS:  Recent Labs  03/09/16 1456 03/09/16 1938 03/10/16 0759  WBC 13.2*  --  13.7*  HGB 6.2* 5.8* 7.7*  HCT 19.1* 17.4* 22.7*  PLT 413*  --  313   BMET  Recent Labs  03/09/16 1456 03/10/16 0759  NA 138 139  K 4.1 4.0  CL 109 113*  CO2 21* 21*  GLUCOSE 158* 108*  BUN 63* 56*  CREATININE 1.60* 1.33*  CALCIUM 8.6* 8.2*   LFT  Recent Labs  03/09/16 1456  PROT 6.0*  ALBUMIN 3.0*  AST 22  ALT 18  ALKPHOS 40  BILITOT 0.1*  BILIDIR <0.1*  IBILI NOT CALCULATED   PT/INR  Recent Labs   03/09/16 1938  LABPROT 19.1*  INR 1.58    STUDIES: Dg Chest 2 View  Result Date: 03/09/2016 CLINICAL DATA:  Shortness of Breath, lung cancer EXAM: CHEST  2 VIEW COMPARISON:  03/05/2016 FINDINGS: Cardiomediastinal silhouette is stable. Persistent left hilar prominence. Persistent left perihilar interstitial prominence probable interstitial tumor spread or pneumonitis. Peripheral consolidation in left upper lobe is stable. Mild hyperinflation. Right lung is clear. Stable sclerotic lesion in right fourth rib. IMPRESSION: Persistent left hilar prominence. Persistent left perihilar interstitial prominence probable interstitial tumor spread or pneumonitis. Peripheral consolidation in left upper lobe is stable. Mild hyperinflation. Right lung is clear. Electronically Signed   By: Lahoma Crocker M.D.   On: 03/09/2016 15:21     PREVIOUS ENDOSCOPIES:            11/07/08: Colonoscopy, Dr. Dortha Kern moderate diverticula in the sigmoid colon   Impression / Plan:   Impression: 1. Acute GI blood loss: History of melenic stool 1, 3-4 days ago, hemoglobin 6.2 at time of presentation to ED 03/09/16, previously 12.3 a week ago, now 7.7 after 2 units PRBCs; most likely this represents an upper GI bleed exacerbated by recent Xarelto use, last dose Xarelto given 03/09/16 a.m. 2. Acute blood loss anemia: Due to hemorrhage as above 3. Metastatic cancer: Recently diagnosed 2 weeks ago, PET scan on 10/18 shows lung mass, bone metastases and possible prostate involvement, oncology will see later today 4. GERD: Long history of at least 5 years, no previous EGD, was controlled on Prilosec 20 mg daily at home  Plan: 1. Ordered 1 more unit PRBCs to be transfused today 2. Hemoglobin every 6 H with transfusion as needed to keep hemoglobin greater than 7 3. Discussed EGD with the patient and his family. Discussed risks, benefits, limitations and alternatives and the patient agrees to proceed. This is scheduled for  tomorrow to allow Xarelto time to leave his system. This will be with Dr. Loletha Carrow. 4. Patient may eat a low residue diet today and then be nothing by mouth after midnight 5. Ordered Protonix '40mg'$  PO BID 6. Please await any further recommendations from Dr. Loletha Carrow  Thank you for your kind consultation, we will continue to follow.  Lavone Nian Lemmon  03/10/2016, 10:18 AM Pager #: [phone]   I have reviewed the entire case in detail with the above APP and discussed the plan in detail.  Therefore, I agree with the diagnoses recorded above. In addition,  I have personally interviewed and examined the patient and have personally reviewed any abdominal/pelvic CT scan images.  My additional thoughts are as follows:  CC: Melena, anemia, suspected Upper GI source in patient on [place].  Clinically stable now. Would like more time for [facility] to get out of his system.  EGD tomorrow, time TBD.  Patient agreeable. Agree with another PRBC today PPI started.  Patient at increased risk for cardiopulmonary complications of procedure due to medical comorbidities.  Total time 60 minutes, over half spent in chart review and discussion with patient and family.  [name] Pager [phone]  Mon-Fri 8a-5p [phone] after 5p, weekends, holidays

## 2016-03-11 NOTE — Progress Notes (Signed)
PROGRESS NOTE  Tim Ross TKW:[id number] DOB: 11-Jul-1934 DOA: 03/09/2016 PCP: Garret Reddish, MD  Brief Summary:  Recent diagnosis of PE started on xarelto a week ago, recent diganosis of metastatic cancer a few days ago, [facility] due to blood loss anemia, prbc transfusion, [facility] /oncology consulted,   To have imaging guided lymphanode biopsy on 10/25, colonoscopy on 10/26    HPI/Recap of past 24 hours:  Patient is seen after returned from EGD Does has intermittent dry cough which is not new per patient  wife and daughter in room  Assessment/Plan: Active Problems:   Anemia   GI bleed   Melena   Acute blood loss anemia   Pulmonary embolus (HCC)   Primary malignant neoplasm of left lung metastatic to other site [facility])   Neoplasm related pain   Acute blood loss anemia/suyptomatic anemia:  like from gi bleed after started on anticoagulation a few weeks ago. plt wnl, INR wnl. Report noticed dark stool in the last few days, FOBT test positive. He denies abdominal pain, no n/v.  hgb 6.2today, it was 12.3 a week ago, s/p two units prbc on 10/22, and prbcx1 on 10/23 , monitor hfb, transfuse to keep hgb >9 per hematology /oncology Dr Irene Limbo.  tele bed.   egd on 10/24 unremarkable, [facility] plan to perform colonoscopy on 10/26.  HTN: hold home meds cozaar, in the setting of elevated cr and sinus tachycardia, will try low dose betablocker with holding parameters.  New diagnosis of PE, CTA from 10/13 showed 'Two or 3 small pulmonary emboli." no hypoxia, bp stable,  hold xarelto in the setting of acute blood loss anemia.   Metastatic cancer,  new diagnosis, lung vs prostate primary with bone mets,  PET scan on 10/18 showed lung mass, bone mets and possible prostate involvement. 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  psa 0.93  proceed with LN biopsy on 10/25.  May need bronchoscopy if node biopsy not sufficient.Last dose  xarelto on 10/22 Oncology Dr Irene Limbo input appreciated.    Right shoulder pain: with known right humeral head mets, pain control, may need rad onc involvement. May benefit from a sling prior to discharge  Recent gout attack: resolved, he was taken off HCTZ. He only drinks alcohol occasionally.  DVT prophylaxis: scd's  Consultants:  [facility]  Oncology Dr Rosine Door Intervention radiology for biopsy  Code Status: full   Family Communication:  Patient, his wife and daughter at bedside  Disposition Plan:pending   Procedures:  EGD planned on 10/24, colonoscopy planned on 10/26  Imaging guided biopsy of supraclavicular or lower cervical lymph node  On 10/25  prbc transfusion  Antibiotics:  none   Objective: BP (!) 110/57 (BP Location: Left Arm)   Pulse 87   Temp 98.4 F (36.9 C) (Oral)   Resp 18   Ht '6\' 1"'$  (1.854 m)   Wt 88.5 kg (195 lb)   SpO2 93%   BMI 25.73 kg/m   Intake/Output Summary (Last 24 hours) at 03/11/16 1311 Last data filed at 03/11/16 0700  Gross per 24 hour  Intake           843.33 ml  Output              125 ml  Net  718.33 ml   Filed Weights   03/09/16 1432  Weight: 88.5 kg (195 lb)    Exam:   General:  NAD  Cardiovascular: RRR  Respiratory: CTABL  Abdomen: Soft/ND/NT, positive BS  Musculoskeletal: No Edema, right shoulder limited range of motion due to pain  Neuro: aaox3  Data Reviewed: Basic Metabolic Panel:  Recent Labs Lab 03/09/16 1456 03/10/16 0759 03/11/16 0120  NA 138 139 139  K 4.1 4.0 4.0  CL 109 113* 111  CO2 21* 21* 22  GLUCOSE 158* 108* 119*  BUN 63* 56* 48*  CREATININE 1.60* 1.33* 1.37*  CALCIUM 8.6* 8.2* 8.2*  MG 2.0  --   --    Liver Function Tests:  Recent Labs Lab 03/09/16 1456  AST 22  ALT 18  ALKPHOS 40  BILITOT  0.1*  PROT 6.0*  ALBUMIN 3.0*   No results for input(s): LIPASE, AMYLASE in the last 168 hours. No results for input(s): AMMONIA in the last 168 hours. CBC:  Recent Labs Lab 03/09/16 1456 03/09/16 1938 03/10/16 0759 03/10/16 1910 03/11/16 0120 03/11/16 0651  WBC 13.2*  --  13.7*  --   --   --   NEUTROABS 10.2*  --   --   --   --   --   HGB 6.2* 5.8* 7.7* 8.6* 8.1* 8.1*  HCT 19.1* 17.4* 22.7*  --   --   --   MCV 99.0  --  91.9  --   --   --   PLT 413*  --  313  --   --   --    Cardiac Enzymes:    Recent Labs Lab 03/09/16 1456  TROPONINI <0.03   BNP (last 3 results)  Recent Labs  03/09/16 1456  BNP 21.9    ProBNP (last 3 results)  Recent Labs  02/27/16 1031  PROBNP 37.0    CBG:  Recent Labs Lab 03/05/16 1341  GLUCAP 121*    Recent Results (from the past 240 hour(s))  Urine culture     Status: None   Collection Time: 03/09/16  5:54 PM  Result Value Ref Range Status   Specimen Description URINE, RANDOM  Final   Special Requests NONE  Final   Culture NO GROWTH Performed at [facility]   Final   Report Status 03/11/2016 FINAL  Final     Studies: No results found.  Scheduled Meds: . feeding supplement (ENSURE ENLIVE)  237 mL Oral BID BM  . mouth rinse  15 mL Mouth Rinse BID  . metoprolol tartrate  12.5 mg Oral BID  . pantoprazole  40 mg Oral BID  . [START ON 03/12/2016] senna-docusate  1 tablet Oral BID  . sodium chloride flush  3 mL Intravenous Q12H    Continuous Infusions: . sodium chloride 20 mL/hr at 03/10/16 2144     Time spent: 15mns  Aoife Bold MD, PhD  [facility] Pager [phone] If 7PM-7AM, please contact night-coverage at [email or web address], password [email or web address]10/24/2017, 1:11 PM  LOS: 2 days

## 2016-03-11 NOTE — Op Note (Signed)
[facility] Patient Name: Tim Ross Procedure Date: 03/11/2016 MRN: [id number] Attending MD: Estill Cotta. Loletha Carrow , MD Date of Birth: 1935/04/23 CSN: [id number] Age: 81 Admit Type: Inpatient Procedure:                Upper GI endoscopy Indications:              Acute post hemorrhagic anemia, Melena Providers:                Mallie Mussel L. Loletha Carrow, MD, Laverta Baltimore RN, RN,                            William Dalton, Technician Referring MD:              Medicines:                Monitored Anesthesia Care Complications:            No immediate complications. Estimated Blood Loss:     Estimated blood loss: none. Procedure:                Pre-Anesthesia Assessment:                           - Prior to the procedure, a History and Physical                            was performed, and patient medications and                            allergies were reviewed. The patient's tolerance of                            previous anesthesia was also reviewed. The risks                            and benefits of the procedure and the sedation                            options and risks were discussed with the patient.                            All questions were answered, and informed consent                            was obtained. Prior Anticoagulants: The patient has                            taken Eliquis (apixaban), last dose was 2 days                            prior to procedure. ASA Grade Assessment: III - A                            patient with severe systemic disease. After  reviewing the risks and benefits, the patient was                            deemed in satisfactory condition to undergo the                            procedure.                           After obtaining informed consent, the endoscope was                            passed under direct vision. Throughout the                            procedure, the patient's blood  pressure, pulse, and                            oxygen saturations were monitored continuously. The                            EG-[id number] ([id number]) scope was introduced through the                            mouth, and advanced to the third part of duodenum.                            The upper GI endoscopy was accomplished without                            difficulty. The patient tolerated the procedure. Scope In: Scope Out: Findings:      The larynx was normal.      A non-obstructing Schatzki ring (acquired) was found at the lower       esophageal sphincter.      The stomach was normal.      The cardia and gastric fundus were normal on retroflexion.      The examined duodenum was normal. Impression:               - Non-obstructing Schatzki ring.                           - Normal stomach.                           - Normal examined duodenum.                           - No specimens collected.                           - Normal larynx. Moderate Sedation:      MAC sedation used Recommendation:           - Resume previous diet.                           -  Medication reconciliation was performed, and a                            list of the patient's discharge medications was                            provided to the patient.                           - Perform a colonoscopy in 2 days because patient                            is scheduled for a lymph node biopsy tomorrow.                           Remain off [place] for now. Procedure Code(s):        --- Professional ---                           [phone], Esophagogastroduodenoscopy, flexible,                            transoral; diagnostic, including collection of                            specimen(s) by brushing or washing, when performed                            (separate procedure) Diagnosis Code(s):        --- Professional ---                           K22.2, Esophageal obstruction                           D62, Acute  posthemorrhagic anemia                           K92.1, Melena (includes Hematochezia) CPT copyright 2016 American Medical Association. All rights reserved. The codes documented in this report are preliminary and upon coder review may  be revised to meet current compliance requirements. Henry L. Loletha Carrow, MD 03/11/2016 2:54:33 PM This report has been signed electronically. Number of Addenda: 0

## 2016-03-11 NOTE — Interval H&P Note (Signed)
History and Physical Interval Note:  03/11/2016 2:18 PM  Kahner A Seger  has presented today for surgery, with the diagnosis of Melena  The various methods of treatment have been discussed with the patient and family. After consideration of risks, benefits and other options for treatment, the patient has consented to  Procedure(s): ESOPHAGOGASTRODUODENOSCOPY (EGD) (N/A) as a surgical intervention .  The patient's history has been reviewed, patient examined, no change in status, stable for surgery.  I have reviewed the patient's chart and labs.  Questions were answered to the patient's satisfaction.     [name]

## 2016-03-11 NOTE — Care Management Note (Signed)
Case Management Note  Patient Details  Name: Tim Ross MRN: [id number] Date of Birth: Jul 04, 1934  Subjective/Objective: 81 y/o m admitted w/Anemia. From home. GI cons-endoscopy today.                   Action/Plan:d/c plan home.   Expected Discharge Date:                  Expected Discharge Plan:  Home/Self Care  In-House Referral:     Discharge planning Services  CM Consult  Post Acute Care Choice:    Choice offered to:     DME Arranged:    DME Agency:     HH Arranged:    HH Agency:     Status of Service:  In process, will continue to follow  If discussed at Long Length of Stay Meetings, dates discussed:    Additional Comments:  Dessa Phi, RN 03/11/2016, 3:03 PM

## 2016-03-11 NOTE — Anesthesia Preprocedure Evaluation (Addendum)
Anesthesia Evaluation  Patient identified by MRN, date of birth, ID band Patient awake    Reviewed: Allergy & Precautions, NPO status , Patient's Chart, lab work & pertinent test results  Airway Mallampati: II  TM Distance: >3 FB Neck ROM: Full    Dental no notable dental hx.    Pulmonary pneumonia, resolved, former smoker,    Pulmonary exam normal breath sounds clear to auscultation       Cardiovascular hypertension, Pt. on medications Normal cardiovascular exam Rhythm:Regular Rate:Normal     Neuro/Psych  Neuromuscular disease negative psych ROS   GI/Hepatic Neg liver ROS, GERD  Medicated,  Endo/Other  negative endocrine ROS  Renal/GU Renal disease  negative genitourinary   Musculoskeletal negative musculoskeletal ROS (+)   Abdominal   Peds negative pediatric ROS (+)  Hematology  (+) anemia ,   Anesthesia Other Findings Full upper caps Chest clear; has lung CA  Reproductive/Obstetrics negative OB ROS                            Anesthesia Physical Anesthesia Plan  ASA: III  Anesthesia Plan: MAC   Post-op Pain Management:    Induction: Intravenous  Airway Management Planned: Natural Airway  Additional Equipment:   Intra-op Plan:   Post-operative Plan:   Informed Consent: I have reviewed the patients History and Physical, chart, labs and discussed the procedure including the risks, benefits and alternatives for the proposed anesthesia with the patient or authorized representative who has indicated his/her understanding and acceptance.   Dental advisory given  Plan Discussed with: CRNA  Anesthesia Plan Comments:         Anesthesia Quick Evaluation

## 2016-03-11 NOTE — Progress Notes (Signed)
Resumed care at 1100. Agree with previous RN assessment.  Will continue to monitor.

## 2016-03-11 NOTE — Progress Notes (Signed)
Initial Nutrition Assessment  DOCUMENTATION CODES:   Non-severe (moderate) malnutrition in context of acute illness/injury  INTERVENTION:  - Continue Ensure Enlive BID, each supplement provides 350 kcal and 20 grams of protein - Continue to encourage PO intakes of meals and supplements. - RD will continue to monitor for needs.  NUTRITION DIAGNOSIS:   Increased nutrient needs related to catabolic illness, cancer and cancer related treatments as evidenced by estimated needs.  GOAL:   Patient will meet greater than or equal to 90% of their needs  MONITOR:   PO intake, Supplement acceptance, Weight trends, Labs, I & O's  REASON FOR ASSESSMENT:   Malnutrition Screening Tool  ASSESSMENT:   81 y.o. Caucasian  male with a past medical history of hypertension, diverticulosis, GERD, hyperlipidemia and a recent diagnosis of PE and metastatic cancer with lung versus prostate primary metastases to bone, who presented to [facility] on 03/[facility]/16 with a complaint of profound or shortness of breath and fatigue.  Pt seen for MST. BMI indicates overweight status. Pt had EGD this afternoon and was NPO for breakfast and lunch related to this. Ordered dinner for pt following visit: cheeseburger with onion, lettuce, tomato, and mayo, chips, sweet potato casserole, and Sprite.   Pt and wife, who is at bedside, report that pt's appetite has been poor recently. Pt was eating a package of Nabs at time of visit and denies offer for anything else prior to dinner tray arriving. Pt states that appetite has been poor for ~2 weeks; wife states that this is when SOB worsened. Pt denies becoming increasingly SOB with PO intakes. He denies any chewing or swallowing issues or taste alterations. States simply a lack of desire for food.   Wife states that over the past 2 weeks pt has lost ~5 lbs. Based on CBW, this indicates 2.5% body weight loss in this time frame which is significant. Physical  assessment indicates mild muscle wasting to shoulder and mild fat wasting to upper arm.   PTA, pt had not tried Ensure but states he had a bottle of chocolate flavored Ensure yesterday which he enjoyed. Encouraged pt to drink Ensure as a snack throughout the day or before bed or as his beverage at meal times.   Medications reviewed; 40 mg oral Protonix BID, 1 tablet Senokot BID.  Labs reviewed; BUN: 48 mg/dL, creatinine: 1.37 mg/dL, Ca: 8.2 mg/dL, GFR: 47 mL/min.     Diet Order:  Diet NPO time specified Diet regular Room service appropriate? Yes; Fluid consistency: Thin  Skin:  Reviewed, no issues  Last BM:  10/23  Height:   Ht Readings from Last 1 Encounters:  03/11/16 '6\' 1"'$  (1.854 m)    Weight:   Wt Readings from Last 1 Encounters:  03/11/16 195 lb (88.5 kg)    Ideal Body Weight:  83.64 kg  BMI:  Body mass index is 25.73 kg/m.  Estimated Nutritional Needs:   Kcal:  2035-2215 (23-25 kcal/kg)  Protein:  85-95 grams  Fluid:  2 L/day  EDUCATION NEEDS:   No education needs identified at this time    Jarome Matin, MS, RD, LDN Inpatient Clinical Dietitian Pager # [phone] After hours/weekend pager # [phone]

## 2016-03-11 NOTE — Transfer of Care (Signed)
Immediate Anesthesia Transfer of Care Note  Patient: Tim Ross  Procedure(s) Performed: Procedure(s): ESOPHAGOGASTRODUODENOSCOPY (EGD) (N/A)  Patient Location: PACU  Anesthesia Type:MAC  Level of Consciousness: sedated  Airway & Oxygen Therapy: Patient Spontanous Breathing and Patient connected to nasal cannula oxygen  Post-op Assessment: Report given to RN and Post -op Vital signs reviewed and stable  Post vital signs: Reviewed and stable  Last Vitals:  Vitals:   03/11/16 0522 03/11/16 1326  BP: (!) 110/57 (!) 158/62  Pulse: 87 95  Resp: 18 20  Temp: 36.9 C 36.8 C    Last Pain:  Vitals:   03/11/16 1326  TempSrc: Oral  PainSc: 4       Patients Stated Pain Goal: 4 (56/43/32 9518)  Complications: No apparent anesthesia complications

## 2016-03-12 ENCOUNTER — Inpatient Hospital Stay (HOSPITAL_COMMUNITY): Payer: Medicare Other | Admitting: Certified Registered Nurse Anesthetist

## 2016-03-12 ENCOUNTER — Encounter (HOSPITAL_COMMUNITY): Payer: Self-pay | Admitting: Gastroenterology

## 2016-03-12 ENCOUNTER — Inpatient Hospital Stay (HOSPITAL_COMMUNITY): Payer: Medicare Other

## 2016-03-12 DIAGNOSIS — K922 Gastrointestinal hemorrhage, unspecified: Secondary | ICD-10-CM

## 2016-03-12 DIAGNOSIS — E440 Moderate protein-calorie malnutrition: Secondary | ICD-10-CM | POA: Diagnosis present

## 2016-03-12 DIAGNOSIS — C3492 Malignant neoplasm of unspecified part of left bronchus or lung: Secondary | ICD-10-CM

## 2016-03-12 LAB — BASIC METABOLIC PANEL
Anion gap: 5 (ref 5–15)
BUN: 33 mg/dL — AB (ref 6–20)
CO2: 26 mmol/L (ref 22–32)
Calcium: 8.2 mg/dL — ABNORMAL LOW (ref 8.9–10.3)
Chloride: 110 mmol/L (ref 101–111)
Creatinine, Ser: 1.38 mg/dL — ABNORMAL HIGH (ref 0.61–1.24)
GFR calc Af Amer: 55 mL/min — ABNORMAL LOW (ref >60)
GFR, EST NON AFRICAN AMERICAN: 47 mL/min — AB (ref >60)
GLUCOSE: 108 mg/dL — AB (ref 65–99)
POTASSIUM: 3.9 mmol/L (ref 3.5–5.1)
Sodium: 141 mmol/L (ref 135–145)

## 2016-03-12 LAB — HEMOGLOBIN
HEMOGLOBIN: 8.2 g/dL — AB (ref 13.0–17.0)
Hemoglobin: 8.2 g/dL — ABNORMAL LOW (ref 13.0–17.0)
Hemoglobin: 8.4 g/dL — ABNORMAL LOW (ref 13.0–17.0)

## 2016-03-12 LAB — PROTIME-INR
INR: 1.17
Prothrombin Time: 15 seconds (ref 11.4–15.2)

## 2016-03-12 LAB — CBC
HCT: 24.1 % — ABNORMAL LOW (ref 39.0–52.0)
Hemoglobin: 7.9 g/dL — ABNORMAL LOW (ref 13.0–17.0)
MCH: 30.7 pg (ref 26.0–34.0)
MCHC: 32.8 g/dL (ref 30.0–36.0)
MCV: 93.8 fL (ref 78.0–100.0)
PLATELETS: 248 10*3/uL (ref 150–400)
RBC: 2.57 MIL/uL — AB (ref 4.22–5.81)
RDW: 16.9 % — AB (ref 11.5–15.5)
WBC: 12.1 10*3/uL — ABNORMAL HIGH (ref 4.0–10.5)

## 2016-03-12 MED ORDER — NALOXONE HCL 0.4 MG/ML IJ SOLN
INTRAMUSCULAR | Status: AC
  Filled 2016-03-12: qty 1

## 2016-03-12 MED ORDER — FLUMAZENIL 0.5 MG/5ML IV SOLN
INTRAVENOUS | Status: AC
  Filled 2016-03-12: qty 5

## 2016-03-12 MED ORDER — FENTANYL CITRATE (PF) 100 MCG/2ML IJ SOLN
INTRAMUSCULAR | Status: AC
  Filled 2016-03-12: qty 4

## 2016-03-12 MED ORDER — FENTANYL CITRATE (PF) 100 MCG/2ML IJ SOLN
INTRAMUSCULAR | Status: AC | PRN
  Administered 2016-03-12 (×2): 25 ug via INTRAVENOUS

## 2016-03-12 MED ORDER — MIDAZOLAM HCL 2 MG/2ML IJ SOLN
INTRAMUSCULAR | Status: AC
  Filled 2016-03-12: qty 4

## 2016-03-12 MED ORDER — MIDAZOLAM HCL 2 MG/2ML IJ SOLN
INTRAMUSCULAR | Status: AC | PRN
  Administered 2016-03-12 (×2): 0.5 mg via INTRAVENOUS

## 2016-03-12 NOTE — Procedures (Signed)
US guided FNA (6) and core (3) biopsies of right supraclavicular lymph node.  Minimal blood loss and no immediate complication.  See full report in PACS.

## 2016-03-12 NOTE — Progress Notes (Signed)
Progress Note   Subjective  Chief Complaint: Melena, anemia  Patient doing fairly well this morning, he does tell me he had a large "very black" stool yesterday. He has had no bowel movement today. There are plans for lymph node biopsy by oncology today. Patient is aware that he will then be prepping for colonoscopy scheduled for tomorrow. He denies abdominal pain or other GI symptoms this morning.    Objective   Vital signs in last 24 hours: Temp:  [97.6 F (36.4 C)-99.4 F (37.4 C)] 99.4 F (37.4 C) (10/25 0521) Pulse Rate:  [95-105] 95 (10/25 0521) Resp:  [18-26] 18 (10/25 0521) BP: (119-164)/(58-71) 122/58 (10/25 0521) SpO2:  [90 %-98 %] 98 % (10/25 0521) Weight:  [195 lb (88.5 kg)] 195 lb (88.5 kg) (10/24 1326) Last BM Date: 03/11/16 General:Caucasian male in NAD Heart:  Regular rate and rhythm; no murmurs Lungs: Respirations even and unlabored, lungs CTA bilaterally Abdomen:  Soft, nontender and nondistended. Normal bowel sounds. Extremities:  Without edema. Neurologic:  Alert and oriented,  grossly normal neurologically. Psych:  Cooperative. Normal mood and affect.  Intake/Output from previous day: 10/24 0701 - 10/25 0700 In: 740 [P.O.:240; I.V.:500] Out: 510 [Urine:510]  Lab Results:  Recent Labs  03/09/16 1456 03/09/16 1938 03/10/16 0759  03/11/16 2148 03/12/16 0404 03/12/16 0946  WBC 13.2*  --  13.7*  --   --  12.1*  --   HGB 6.2* 5.8* 7.7*  < > 8.2* 7.9* 8.2*  HCT 19.1* 17.4* 22.7*  --   --  24.1*  --   PLT 413*  --  313  --   --  248  --   < > = values in this interval not displayed. BMET  Recent Labs  03/10/16 0759 03/11/16 0120 03/12/16 0404  NA 139 139 141  K 4.0 4.0 3.9  CL 113* 111 110  CO2 21* 22 26  GLUCOSE 108* 119* 108*  BUN 56* 48* 33*  CREATININE 1.33* 1.37* 1.38*  CALCIUM 8.2* 8.2* 8.2*   LFT  Recent Labs  03/09/16 1456  PROT 6.0*  ALBUMIN 3.0*  AST 22  ALT 18  ALKPHOS 40  BILITOT 0.1*  BILIDIR <0.1*  IBILI NOT  CALCULATED   PT/INR  Recent Labs  03/09/16 1938 03/12/16 0404  LABPROT 19.1* 15.0  INR 1.58 1.17    Studies/Results: Mr Jeri Cos VQ Contrast  Result Date: 03/10/2016 CLINICAL DATA:  Metastatic disease.  Brain staging. EXAM: MRI HEAD WITHOUT AND WITH CONTRAST TECHNIQUE: Multiplanar, multiecho pulse sequences of the brain and surrounding structures were obtained without and with intravenous contrast. CONTRAST:  58m MULTIHANCE GADOBENATE DIMEGLUMINE 529 MG/ML IV SOLN COMPARISON:  None. FINDINGS: Brain: No abnormal intracranial enhancement to suggest metastatic disease. The right frontal extra-axial low-density appearance on previous PET-CT reflects a small hygroma without significant mass effect. Small hygroma also present around the right cerebellum. Age congruent brain volume and white matter appearance. No hydrocephalus. Vascular: Normal flow voids. Skull and upper cervical spine: No evidence of metastasis Sinuses/Orbits: Bilateral cataract resection. IMPRESSION: Negative for intracranial metastasis. The right frontal finding on previous PET-CT is a small hygroma. Electronically Signed   By: JMonte FantasiaM.D.   On: 03/10/2016 11:15   03/11/16-EGD, Dr. DAngelene Giovanni Nonobstructing Schatzki ring, normal stomach, normal examined duodenum, no specimens collected, normal larynx; recommendation: Colonoscopy in 2 days because patient is scheduled for lymph node biopsy tomorrow remain [place] now    Assessment / Plan:   Assessment:  1. Acute GI blood loss: History of melenic stool, most recently yesterday afternoon hemoglobin 6.2 at time of presentation to ED 03/09/16, previously 12.3 a week ago, now stable around 8 over the past 24 hours;  EGD completed on 03/11/16 was unrevealing and plans are for colonoscopy tomorrow for further evaluation 2. Acute blood loss anemia: Due to hemorrhage as above 3. Metastatic cancer: Recently diagnosed 2 weeks ago, PET scan on 10/18 shows lung mass, bone  metastases and possible prostate involvement, oncology following with lymph node biopsy planned today 4. GERD: Long history of at least 5 years, no previous EGD, was controlled on Prilosec 20 mg daily at home  Plan: 1. Plans for colonoscopy tomorrow for further evaluation of GI bleeding. Patient will start prep at 4:00 today. We did discuss risks, benefits, limitations and alternatives to the procedure and the patient agrees to proceed. This was scheduled with Dr. Loletha Carrow for tomorrow morning around 9:00. 2. Patient may have clears after his lymph node biopsy today and before midnight. He will then be nothing by mouth after midnight 3. Continue to monitor hemoglobin with transfusion less than 7 4. Please await any further recommendations from Dr. Loletha Carrow  Thank you for your kind consultation, we will continue to follow.   LOS: 3 days   Levin Erp  03/12/2016, 10:41 AM  Pager # [phone]  I have discussed the case with the PA, and that is the plan I formulated.   CC: melena  Colonoscopy tomorrow.  If unrevealing, small bowel video capsule study to follow.    [name] Pager [phone]  Mon-Fri 8a-5p [phone] after 5p, weekends, holidays

## 2016-03-12 NOTE — Progress Notes (Addendum)
PROGRESS NOTE  Tim Ross UJW:[id number] DOB: 1934-08-29 DOA: 03/09/2016 PCP: Garret Reddish, MD  Brief Summary:  Recent diagnosis of PE started on xarelto a week ago, recent diganosis of metastatic cancer a few days ago, [facility] due to blood loss anemia, prbc transfusion, [facility]BGI /oncology consulted.    To have imaging guided lymphanode biopsy on 10/25, colonoscopy on 10/26.     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 plt wnl, INR wnl. Report noticed dark stool in the last few days, FOBT test positive. Also reports large black BM last night .  He denies abdominal pain, no n/v.  hgb 6.2 on admission, it was 12.3 a week ago, s/p two units prbc on 10/22, and prbcx1 on 10/23 , monitor hfb, transfuse to keep hgb >9 per hematology /oncology Dr Irene Limbo.   EGD10/24 unremarkable, LBGI plan to perform colonoscopy on 10/26.  HTN: hold home meds cozaar, in the setting of elevated cr and sinus tachycardia, will try low dose betablocker with holding parameters.  New diagnosis of PE, CTA from 10/13 showed 'Two or 3 small pulmonary emboli." no hypoxia, bp stable,  hold xarelto in the setting of acute blood loss anemia.   Metastatic cancer,  new diagnosis, lung vs prostate primary with bone mets,  PET scan on 10/18 showed lung mass, bone mets and possible prostate involvement. 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  psa 0.93  proceed with LN  biopsy on 10/25.  May need bronchoscopy if node biopsy not sufficient.Last dose  xarelto on 10/22 Oncology Dr Irene Limbo input appreciated.   Right shoulder pain: with known right humeral head mets, pain control, may need rad onc involvement. May benefit from a sling prior to discharge  Moderate protein calorie malnutrition - see dietitian recommendations.  Recent gout attack: resolved, he was taken off HCTZ. He only drinks alcohol occasionally.  DVT prophylaxis: scd's  Consultants:  LBGI  Oncology Dr Rosine Door Intervention radiology for biopsy  Code Status: full   Family Communication:  Patient, his wife and daughter at bedside  Disposition Plan:pending   Procedures:  EGD planned on 10/24, colonoscopy planned on 10/26  Imaging guided biopsy of supraclavicular or lower cervical lymph node  On 10/25  prbc transfusion  Antibiotics:  none   Objective: BP 130/67 (BP Location: Left Arm)   Pulse 97   Temp 99.4 F (37.4 C) (Oral)   Resp 18   Ht '6\' 1"'$  (1.854 m)   Wt 88.5 kg (195 lb)   SpO2 96%   BMI 25.73 kg/m   Intake/Output Summary (Last 24 hours) at 03/12/16 1244 Last data filed at 03/12/16 0441  Gross per 24 hour  Intake              740 ml  Output              350 ml  Net  390 ml   Filed Weights   03/09/16 1432 03/11/16 1326  Weight: 88.5 kg (195 lb) 88.5 kg (195 lb)    Exam:   General:  NAD, awake, alert, cooperative  Cardiovascular: RRR normal s1, s2 sounds  Respiratory: CTABL  Abdomen: Soft/ND/NT, positive BS  Musculoskeletal: No Edema, right shoulder limited range of motion due to pain  Neuro: aaox3  Data Reviewed: Basic Metabolic Panel:  Recent Labs Lab 03/09/16 1456 03/10/16 0759 03/11/16 0120 03/12/16 0404  NA 138 139 139 141  K 4.1 4.0 4.0 3.9  CL 109 113* 111 110  CO2 21* 21* 22 26  GLUCOSE 158* 108* 119* 108*  BUN 63* 56* 48* 33*  CREATININE 1.60* 1.33* 1.37* 1.38*  CALCIUM 8.6* 8.2* 8.2* 8.2*  MG 2.0  --   --    --    Liver Function Tests:  Recent Labs Lab 03/09/16 1456  AST 22  ALT 18  ALKPHOS 40  BILITOT 0.1*  PROT 6.0*  ALBUMIN 3.0*   No results for input(s): LIPASE, AMYLASE in the last 168 hours. No results for input(s): AMMONIA in the last 168 hours. CBC:  Recent Labs Lab 03/09/16 1456 03/09/16 1938 03/10/16 0759  03/11/16 0651 03/11/16 1543 03/11/16 2148 03/12/16 0404 03/12/16 0946  WBC 13.2*  --  13.7*  --   --   --   --  12.1*  --   NEUTROABS 10.2*  --   --   --   --   --   --   --   --   HGB 6.2* 5.8* 7.7*  < > 8.1* 8.7* 8.2* 7.9* 8.2*  HCT 19.1* 17.4* 22.7*  --   --   --   --  24.1*  --   MCV 99.0  --  91.9  --   --   --   --  93.8  --   PLT 413*  --  313  --   --   --   --  248  --   < > = values in this interval not displayed. Cardiac Enzymes:    Recent Labs Lab 03/09/16 1456  TROPONINI <0.03   BNP (last 3 results)  Recent Labs  03/09/16 1456  BNP 21.9    ProBNP (last 3 results)  Recent Labs  02/27/16 1031  PROBNP 37.0    CBG:  Recent Labs Lab 03/05/16 1341  GLUCAP 121*    Recent Results (from the past 240 hour(s))  Urine culture     Status: None   Collection Time: 03/09/16  5:54 PM  Result Value Ref Range Status   Specimen Description URINE, RANDOM  Final   Special Requests NONE  Final   Culture NO GROWTH Performed at [facility]   Final   Report Status 03/11/2016 FINAL  Final    Studies: No results found.  Scheduled Meds: . feeding supplement (ENSURE ENLIVE)  237 mL Oral BID BM  . fentaNYL      . mouth rinse  15 mL Mouth Rinse BID  . metoprolol tartrate  12.5 mg Oral BID  . midazolam      . pantoprazole  40 mg Oral BID  . senna-docusate  1 tablet Oral BID  . sodium chloride flush  3 mL Intravenous Q12H   Continuous Infusions:   Time spent: 78mns  CIrwin BrakemanMD [facility] Pager [phone] If 7PM-7AM, please contact night-coverage at [email or web address], password [facility]10/25/2017, 12:44 PM  LOS: 3  days

## 2016-03-13 ENCOUNTER — Encounter (HOSPITAL_COMMUNITY): Payer: Self-pay

## 2016-03-13 ENCOUNTER — Encounter (HOSPITAL_COMMUNITY)
Admission: EM | Disposition: A | Discharge status: Home / Self Care | Payer: Self-pay | Source: Home / Self Care | Attending: Family Medicine

## 2016-03-13 LAB — HEMOGLOBIN
HEMOGLOBIN: 7.9 g/dL — AB (ref 13.0–17.0)
HEMOGLOBIN: 8.3 g/dL — AB (ref 13.0–17.0)

## 2016-03-13 LAB — HEMOGLOBIN AND HEMATOCRIT, BLOOD
HEMATOCRIT: 27.3 % — AB (ref 39.0–52.0)
Hemoglobin: 9.3 g/dL — ABNORMAL LOW (ref 13.0–17.0)

## 2016-03-13 LAB — PREPARE RBC (CROSSMATCH)

## 2016-03-13 SURGERY — CANCELLED PROCEDURE
Anesthesia: Monitor Anesthesia Care

## 2016-03-13 MED ORDER — ONDANSETRON HCL 4 MG/2ML IJ SOLN
INTRAMUSCULAR | Status: AC
  Filled 2016-03-13: qty 2

## 2016-03-13 MED ORDER — SODIUM CHLORIDE 0.9 % IV SOLN
Freq: Once | INTRAVENOUS | Status: AC
  Administered 2016-03-13: 14:00:00 via INTRAVENOUS

## 2016-03-13 MED ORDER — PEG-KCL-NACL-NASULF-NA ASC-C 100 G PO SOLR
0.5000 | Freq: Once | ORAL | Status: AC
  Administered 2016-03-13: 100 g via ORAL
  Filled 2016-03-13: qty 1

## 2016-03-13 MED ORDER — NYSTATIN 100000 UNIT/ML MT SUSP
5.0000 mL | Freq: Four times a day (QID) | OROMUCOSAL | Status: DC
  Administered 2016-03-13 – 2016-03-16 (×14): 500000 [IU] via ORAL
  Filled 2016-03-13 (×15): qty 5

## 2016-03-13 MED ORDER — SODIUM CHLORIDE 0.9 % IV SOLN: INTRAVENOUS | Status: DC

## 2016-03-13 MED ORDER — LIDOCAINE 2% (20 MG/ML) 5 ML SYRINGE
INTRAMUSCULAR | Status: AC
  Filled 2016-03-13: qty 5

## 2016-03-13 MED ORDER — PEG-KCL-NACL-NASULF-NA ASC-C 100 G PO SOLR: 1.0000 | Freq: Once | ORAL | Status: DC

## 2016-03-13 MED ORDER — PROPOFOL 10 MG/ML IV BOLUS
INTRAVENOUS | Status: AC
  Filled 2016-03-13: qty 60

## 2016-03-13 MED ORDER — METHYLPREDNISOLONE SODIUM SUCC 125 MG IJ SOLR
125.0000 mg | Freq: Once | INTRAMUSCULAR | Status: AC
  Administered 2016-03-13: 125 mg via INTRAVENOUS
  Filled 2016-03-13: qty 2

## 2016-03-13 MED ORDER — LACTATED RINGERS IV SOLN
INTRAVENOUS | Status: DC
  Administered 2016-03-13: 08:00:00 via INTRAVENOUS

## 2016-03-13 MED ORDER — ACETAMINOPHEN 325 MG PO TABS
650.0000 mg | ORAL_TABLET | Freq: Once | ORAL | Status: AC
  Administered 2016-03-13: 650 mg via ORAL
  Filled 2016-03-13: qty 2

## 2016-03-13 NOTE — Interval H&P Note (Signed)
History and Physical Interval Note:  03/13/2016 8:29 AM  Tim Ross  has presented today for surgery, with the diagnosis of gi bleed  The various methods of treatment have been discussed with the patient and family. After consideration of risks, benefits and other options for treatment, the patient has consented to  Procedure(s): COLONOSCOPY (N/A) as a surgical intervention .  The patient's history has been reviewed, patient examined, no change in status, stable for surgery.  I have reviewed the patient's chart and labs.  Questions were answered to the patient's satisfaction.     [name]

## 2016-03-13 NOTE — Progress Notes (Signed)
PROGRESS NOTE  Tim Ross XIP:[id number] DOB: 05/17/1935 DOA: 03/09/2016 PCP: Garret Reddish, MD  Brief Summary:  Recent diagnosis of PE started on xarelto a week ago, recent diganosis of metastatic cancer a few days ago, [facility] due to blood loss anemia, prbc transfusion, LBGI /oncology consulted.    To have imaging guided lymphanode biopsy on 10/25, colonoscopy on 10/27.     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 plt wnl, INR wnl. Report noticed dark stool in the last few days, FOBT test positive. Also reports large black BM 10/24.  He denies abdominal pain, no n/v.  hgb 6.2 on admission, it was 12.3 a week ago, s/p two units prbc on 10/22, and prbcx1 on 10/23 , monitor hfb, transfuse to keep hgb >9 per hematology /oncology Dr Irene Limbo.   EGD10/24 unremarkable, [facility]I plan to perform colonoscopy on 10/27.  Hg trending down again, will transfuse 1 unit [facility] 10/26.    HTN: hold home meds cozaar, in the setting of elevated cr and sinus tachycardia, will try low dose betablocker with holding parameters.  New diagnosis of PE, CTA from 10/13 showed 'Two or 3 small pulmonary emboli." no hypoxia, bp stable,  hold xarelto in the setting of acute blood loss anemia.   Metastatic cancer,  new diagnosis, lung vs prostate primary with bone mets,  PET scan on 10/18 showed lung mass, bone mets and possible prostate involvement. Also showed "Asymmetric extra-axial hypodensity along the right frontal lobe, possibly a chronic subdural hematoma or cystic hygroma," MRI brain w/wo contrast negative for brain mets, The right frontal finding on previous  PET-CT is a small hygroma.  psa 0.93  proceed with LN biopsy on 10/25.  May need bronchoscopy if node biopsy not sufficient.Last dose  xarelto on 10/22 Oncology Dr Irene Limbo input appreciated.   Right shoulder pain: with known right humeral head mets, pain control, may need rad onc involvement. May benefit from a sling prior to discharge  Moderate protein calorie malnutrition - see dietitian recommendations.  Recent gout attack: resolved, he was taken off HCTZ. He only drinks alcohol occasionally.  DVT prophylaxis: scd's  Consultants:  [facility]  Oncology Dr Rosine Door Intervention radiology for biopsy  Code Status: full   Family Communication:  Patient, his wife and daughter at bedside  Disposition Plan:pending   Procedures:  EGD planned on 10/24, colonoscopy planned on 10/26  Imaging guided biopsy of supraclavicular or lower cervical lymph node  On 10/25  prbc transfusion  Antibiotics:  none   Objective: BP (!) 156/56   Pulse (!) 104   Temp 98.7 F (37.1 C)   Resp (!) 22   Ht '6\' 1"'$  (1.854 m)   Wt 88.5 kg (195 lb)   SpO2 94%   BMI 25.73 kg/m   Intake/Output Summary (Last 24 hours) at 03/13/16 1041 Last data filed at 03/13/16 0533  Gross per 24 hour  Intake                0 ml  Output              125 ml  Net             -  125 ml   Filed Weights   03/09/16 1432 03/11/16 1326  Weight: 88.5 kg (195 lb) 88.5 kg (195 lb)    Exam:   General:  NAD, awake, alert, cooperative  Cardiovascular: RRR normal s1, s2 sounds  Respiratory: CTABL  Abdomen: Soft/ND/NT, positive BS  Musculoskeletal: No Edema, right shoulder limited range of motion due to pain  Neuro: aaox3  Data Reviewed: Basic Metabolic Panel:  Recent Labs Lab 03/09/16 1456 03/10/16 0759 03/11/16 0120 04-06-16 0404  NA 138 139 139 141  K 4.1 4.0 4.0 3.9  CL 109 113* 111 110  CO2 21* 21* 22 26  GLUCOSE 158* 108* 119* 108*  BUN 63* 56* 48* 33*  CREATININE 1.60* 1.33* 1.37* 1.38*  CALCIUM  8.6* 8.2* 8.2* 8.2*  MG 2.0  --   --   --    Liver Function Tests:  Recent Labs Lab 03/09/16 1456  AST 22  ALT 18  ALKPHOS 40  BILITOT 0.1*  PROT 6.0*  ALBUMIN 3.0*   No results for input(s): LIPASE, AMYLASE in the last 168 hours. No results for input(s): AMMONIA in the last 168 hours. CBC:  Recent Labs Lab 03/09/16 1456 03/09/16 1938 03/10/16 0759  06-Apr-2016 0404 2016/04/06 0946 04-06-2016 1347 04/06/16 2003 03/13/16 0146 03/13/16 0854  WBC 13.2*  --  13.7*  --  12.1*  --   --   --   --   --   NEUTROABS 10.2*  --   --   --   --   --   --   --   --   --   HGB 6.2* 5.8* 7.7*  < > 7.9* 8.2* 8.2* 8.4* 7.9* 8.3*  HCT 19.1* 17.4* 22.7*  --  24.1*  --   --   --   --   --   MCV 99.0  --  91.9  --  93.8  --   --   --   --   --   PLT 413*  --  313  --  248  --   --   --   --   --   < > = values in this interval not displayed. Cardiac Enzymes:    Recent Labs Lab 03/09/16 1456  TROPONINI <0.03   BNP (last 3 results)  Recent Labs  03/09/16 1456  BNP 21.9    ProBNP (last 3 results)  Recent Labs  02/27/16 1031  PROBNP 37.0    CBG: No results for input(s): GLUCAP in the last 168 hours.  Recent Results (from the past 240 hour(s))  Urine culture     Status: None   Collection Time: 03/09/16  5:54 PM  Result Value Ref Range Status   Specimen Description URINE, RANDOM  Final   Special Requests NONE  Final   Culture NO GROWTH Performed at [facility]   Final   Report Status 03/11/2016 FINAL  Final    Studies: US Biopsy  Result Date: 04-06-16 INDICATION: 81 year old with an infiltrative left upper lobe lesion. Concern for metastatic lung cancer and tissue diagnosis is needed. There are hypermetabolic supraclavicular lymph nodes. EXAM: ULTRASOUND-GUIDED CORE BIOPSIES AND FINE-NEEDLE ASPIRATIONS OF RIGHT SUPRACLAVICULAR LYMPH NODE MEDICATIONS: None. ANESTHESIA/SEDATION: Moderate (conscious) sedation was employed during this procedure. A total of Versed 1.0  mg and Fentanyl 50 mcg was administered intravenously. Moderate Sedation Time: 21 minutes. The patient's level of consciousness and vital signs were monitored continuously by radiology nursing throughout the procedure under my direct supervision. FLUOROSCOPY  TIME:  None COMPLICATIONS: None immediate. PROCEDURE: Informed written consent was obtained from the patient after a thorough discussion of the procedural risks, benefits and alternatives. All questions were addressed. A timeout was performed prior to the initiation of the procedure. Both sides of the neck evaluated with ultrasound. Small bilateral supraclavicular lymph nodes were identified. Right supraclavicular lymph node adjacent to the right internal jugular vein was felt to be most amendable to biopsy. Right side of the neck was prepped with chlorhexidine. Skin was anesthetized with 1% lidocaine. A total of 6 ultrasound-guided fine-needle aspirations were obtained within this lymph node with 25 gauge needles. Following the fine-needle aspirations, 3 core biopsies were obtained with a 20 gauge core device using ultrasound guidance. Specimens placed in formalin. Bandage placed over the puncture site. FINDINGS: Small bilateral supraclavicular lymph nodes. A round hypoechoic lymph node just lateral to the right internal jugular vein was sampled. This node measures roughly 1 cm in the short axis. Biopsy needles confirmed within the lesion. IMPRESSION: Ultrasound-guided core biopsies and fine-needle aspirations of right supraclavicular lymph node. Electronically Signed   By: Markus Daft M.D.   On: 03/12/2016 13:15    Scheduled Meds: . feeding supplement (ENSURE ENLIVE)  237 mL Oral BID BM  . mouth rinse  15 mL Mouth Rinse BID  . methylPREDNISolone (SOLU-MEDROL) injection  125 mg Intravenous Once  . metoprolol tartrate  12.5 mg Oral BID  . nystatin  5 mL Oral QID  . pantoprazole  40 mg Oral BID  . senna-docusate  1 tablet Oral BID  . sodium chloride  flush  3 mL Intravenous Q12H   Continuous Infusions:   Time spent: 15 mins  Irwin Brakeman MD [facility] Pager [phone]. If 7PM-7AM, please contact night-coverage at [email or web address], password [facility] 03/13/2016, 10:41 AM  LOS: 4 days

## 2016-03-13 NOTE — Care Management Note (Signed)
Case Management Note  Patient Details  Name: Tim Ross MRN: [id number] Date of Birth: 1934/11/11  Subjective/Objective:  GIB-hgb-7.9-1 u prbc, now hgb-8.3. For colonoscopy in am.                   Action/Plan:d/c plan home.   Expected Discharge Date:                  Expected Discharge Plan:  Home/Self Care  In-House Referral:     Discharge planning Services  CM Consult  Post Acute Care Choice:    Choice offered to:     DME Arranged:    DME Agency:     HH Arranged:    HH Agency:     Status of Service:  In process, will continue to follow  If discussed at Long Length of Stay Meetings, dates discussed:    Additional Comments:  Dessa Phi, RN 03/13/2016, 11:01 AM

## 2016-03-13 NOTE — H&P (View-Only) (Signed)
Progress Note   Subjective  Chief Complaint: Melena, anemia  Patient doing fairly well this morning, he does tell me he had a large "very black" stool yesterday. He has had no bowel movement today. There are plans for lymph node biopsy by oncology today. Patient is aware that he will then be prepping for colonoscopy scheduled for tomorrow. He denies abdominal pain or other GI symptoms this morning.    Objective   Vital signs in last 24 hours: Temp:  [97.6 F (36.4 C)-99.4 F (37.4 C)] 99.4 F (37.4 C) (10/25 0521) Pulse Rate:  [95-105] 95 (10/25 0521) Resp:  [18-26] 18 (10/25 0521) BP: (119-164)/(58-71) 122/58 (10/25 0521) SpO2:  [90 %-98 %] 98 % (10/25 0521) Weight:  [195 lb (88.5 kg)] 195 lb (88.5 kg) (10/24 1326) Last BM Date: 03/11/16 General:Caucasian male in NAD Heart:  Regular rate and rhythm; no murmurs Lungs: Respirations even and unlabored, lungs CTA bilaterally Abdomen:  Soft, nontender and nondistended. Normal bowel sounds. Extremities:  Without edema. Neurologic:  Alert and oriented,  grossly normal neurologically. Psych:  Cooperative. Normal mood and affect.  Intake/Output from previous day: 10/24 0701 - 10/25 0700 In: 740 [P.O.:240; I.V.:500] Out: 510 [Urine:510]  Lab Results:  Recent Labs  03/09/16 1456 03/09/16 1938 03/10/16 0759  03/11/16 2148 03/12/16 0404 03/12/16 0946  WBC 13.2*  --  13.7*  --   --  12.1*  --   HGB 6.2* 5.8* 7.7*  < > 8.2* 7.9* 8.2*  HCT 19.1* 17.4* 22.7*  --   --  24.1*  --   PLT 413*  --  313  --   --  248  --   < > = values in this interval not displayed. BMET  Recent Labs  03/10/16 0759 03/11/16 0120 03/12/16 0404  NA 139 139 141  K 4.0 4.0 3.9  CL 113* 111 110  CO2 21* 22 26  GLUCOSE 108* 119* 108*  BUN 56* 48* 33*  CREATININE 1.33* 1.37* 1.38*  CALCIUM 8.2* 8.2* 8.2*   LFT  Recent Labs  03/09/16 1456  PROT 6.0*  ALBUMIN 3.0*  AST 22  ALT 18  ALKPHOS 40  BILITOT 0.1*  BILIDIR <0.1*  IBILI NOT  CALCULATED   PT/INR  Recent Labs  03/09/16 1938 03/12/16 0404  LABPROT 19.1* 15.0  INR 1.58 1.17    Studies/Results: Mr Jeri Cos PV Contrast  Result Date: 03/10/2016 CLINICAL DATA:  Metastatic disease.  Brain staging. EXAM: MRI HEAD WITHOUT AND WITH CONTRAST TECHNIQUE: Multiplanar, multiecho pulse sequences of the brain and surrounding structures were obtained without and with intravenous contrast. CONTRAST:  31m MULTIHANCE GADOBENATE DIMEGLUMINE 529 MG/ML IV SOLN COMPARISON:  None. FINDINGS: Brain: No abnormal intracranial enhancement to suggest metastatic disease. The right frontal extra-axial low-density appearance on previous PET-CT reflects a small hygroma without significant mass effect. Small hygroma also present around the right cerebellum. Age congruent brain volume and white matter appearance. No hydrocephalus. Vascular: Normal flow voids. Skull and upper cervical spine: No evidence of metastasis Sinuses/Orbits: Bilateral cataract resection. IMPRESSION: Negative for intracranial metastasis. The right frontal finding on previous PET-CT is a small hygroma. Electronically Signed   By: JMonte FantasiaM.D.   On: 03/10/2016 11:15   03/11/16-EGD, Dr. DAngelene Giovanni Nonobstructing Schatzki ring, normal stomach, normal examined duodenum, no specimens collected, normal larynx; recommendation: Colonoscopy in 2 days because patient is scheduled for lymph node biopsy tomorrow [place]    Assessment / Plan:   Assessment:  1. Acute GI blood loss: History of melenic stool, most recently yesterday afternoon hemoglobin 6.2 at time of presentation to ED 03/09/16, previously 12.3 a week ago, now stable around 8 over the past 24 hours;  EGD completed on 03/11/16 was unrevealing and plans are for colonoscopy tomorrow for further evaluation 2. Acute blood loss anemia: Due to hemorrhage as above 3. Metastatic cancer: Recently diagnosed 2 weeks ago, PET scan on 10/18 shows lung mass, bone  metastases and possible prostate involvement, oncology following with lymph node biopsy planned today 4. GERD: Long history of at least 5 years, no previous EGD, was controlled on Prilosec 20 mg daily at home  Plan: 1. Plans for colonoscopy tomorrow for further evaluation of GI bleeding. Patient will start prep at 4:00 today. We did discuss risks, benefits, limitations and alternatives to the procedure and the patient agrees to proceed. This was scheduled with Dr. Loletha Carrow for tomorrow morning around 9:00. 2. Patient may have clears after his lymph node biopsy today and before midnight. He will then be nothing by mouth after midnight 3. Continue to monitor hemoglobin with transfusion less than 7 4. Please await any further recommendations from Dr. Loletha Carrow  Thank you for your kind consultation, we will continue to follow.   LOS: 3 days   Levin Erp  03/12/2016, 10:41 AM  Pager # [phone]  I have discussed the case with the PA, and that is the plan I formulated.   CC: melena  Colonoscopy tomorrow.  If unrevealing, small bowel video capsule study to follow.    [name] Pager [phone]  Mon-Fri 8a-5p [phone] after 5p, weekends, holidays

## 2016-03-13 NOTE — Anesthesia Preprocedure Evaluation (Deleted)
Anesthesia Evaluation  Patient identified by MRN, date of birth, ID band Patient awake    Reviewed: Allergy & Precautions, NPO status , Patient's Chart, lab work & pertinent test results  Airway Mallampati: II  TM Distance: >3 FB Neck ROM: Full    Dental no notable dental hx.    Pulmonary pneumonia, resolved, former smoker,    Pulmonary exam normal breath sounds clear to auscultation       Cardiovascular hypertension, Normal cardiovascular exam Rhythm:Regular Rate:Normal     Neuro/Psych  Neuromuscular disease negative psych ROS   GI/Hepatic Neg liver ROS, GERD  ,  Endo/Other  negative endocrine ROS  Renal/GU Renal disease  negative genitourinary   Musculoskeletal negative musculoskeletal ROS (+)   Abdominal   Peds negative pediatric ROS (+)  Hematology  (+) anemia ,   Anesthesia Other Findings   Reproductive/Obstetrics negative OB ROS                             Anesthesia Physical Anesthesia Plan  ASA: II  Anesthesia Plan: MAC   Post-op Pain Management:    Induction: Intravenous  Airway Management Planned: Natural Airway  Additional Equipment:   Intra-op Plan:   Post-operative Plan:   Informed Consent: I have reviewed the patients History and Physical, chart, labs and discussed the procedure including the risks, benefits and alternatives for the proposed anesthesia with the patient or authorized representative who has indicated his/her understanding and acceptance.   Dental advisory given  Plan Discussed with: CRNA  Anesthesia Plan Comments:         Anesthesia Quick Evaluation

## 2016-03-13 NOTE — Care Management Important Message (Signed)
Important Message  Patient Details  Name: Tim Ross MRN: [id number] Date of Birth: 02/21/1935   Medicare Important Message Given:  Yes    Camillo Flaming 03/13/2016, 10:09 AMImportant Message  Patient Details  Name: Tim Ross MRN: [id number] Date of Birth: 1934/07/31   Medicare Important Message Given:  Yes    Camillo Flaming 03/13/2016, 10:09 AM

## 2016-03-13 NOTE — Progress Notes (Signed)
Tim Ross   HEMATOLOGY/ONCOLOGY INPATIENT PROGRESS NOTE  Date of Service: 03/13/2016  Inpatient Attending: .Clanford Marisa Hua, MD   SUBJECTIVE  Patient was seen with his wife at bedside. He notes no further overt GI bleeding. Feels better after PRBC transfusions.Notes that his colonoscopy and possibly capsule endoscopy is scheduled for tomorrow. He had an US guided biopsy yesterday. I discussed this with Dr Tresa Moore (pathology) -- appears to have large looking ,malignant cells -- results will likely be available tomorrow. No other acute new symptoms.   OBJECTIVE:  NAD  PHYSICAL EXAMINATION: . Vitals:   03/13/16 1042 03/13/16 1308 03/13/16 1434 03/13/16 1504  BP:  (!) 116/55 122/64 (!) 120/59  Pulse:  80 82 79  Resp:  18 18 18   Temp: 98.1 F (36.7 C) 98.1 F (36.7 C) 98.2 F (36.8 C) 98.2 F (36.8 C)  TempSrc: Oral Oral Oral Oral  SpO2:  95% 95% 94%  Weight:      Height:       Filed Weights   03/09/16 1432 03/11/16 1326  Weight: 195 lb (88.5 kg) 195 lb (88.5 kg)   .Body mass index is 25.73 kg/m.  GENERAL:alert, in no acute distress and comfortable SKIN: skin color, texture, turgor are normal, no rashes or significant lesions EYES: normal, conjunctiva are pink and non-injected, sclera clear OROPHARYNX:no exudate, no erythema and lips, buccal mucosa, and tongue normal  NECK: supple, no JVD, thyroid normal size, non-tender, without nodularity LYMPH:  no palpable lymphadenopathy in the cervical, axillary or inguinal LUNGS: clear to auscultation with normal respiratory effort HEART: regular rate & rhythm,  no murmurs and no lower extremity edema ABDOMEN: abdomen soft, non-tender, normoactive bowel sounds  Musculoskeletal: no cyanosis of digits and no clubbing  PSYCH: alert & oriented x 3 with fluent speech NEURO: no focal motor/sensory deficits  MEDICAL HISTORY:  Past Medical History:  Diagnosis Date  . Cancer ([place])   . Colonic polyp   . GERD (gastroesophageal reflux  disease)   . Hyperlipidemia   . Hypertension   . Muscular degeneration   . Pneumonia 2002  . Testicular cyst    removed-noncancerous    SURGICAL HISTORY: Past Surgical History:  Procedure Laterality Date  . CATARACT EXTRACTION     right side  . ESOPHAGOGASTRODUODENOSCOPY N/A 03/11/2016   Procedure: ESOPHAGOGASTRODUODENOSCOPY (EGD);  Surgeon: Doran Stabler, MD;  Location: [facility];  Service: Endoscopy;  Laterality: N/A;  . KNEE SURGERY     scope on both  . LUMBAR LAMINECTOMY    . TRANSURETHRAL RESECTION OF PROSTATE     resolved issues    SOCIAL HISTORY: Social History   Social History  . Marital status: Married    Spouse name: N/A  . Number of children: N/A  . Years of education: N/A   Occupational History  . Not on file.   Social History Main Topics  . Smoking status: Former Smoker    Packs/day: 1.50    Years: 40.00    Types: Cigarettes    Quit date: 12/30/1990  . Smokeless tobacco: Never Used  . Alcohol use No  . Drug use: No  . Sexual activity: Not on file   Other Topics Concern  . Not on file   Social History Narrative   Married (2nd marriage-79). No children. Dog.       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       Hobbies: tv, walks daily or every other day    FAMILY HISTORY: Family  History  Problem Relation Age of Onset  . Diabetes Mother   . Hypertension Mother   . Heart attack Father     late 14s, former smoker  . Heart disease Brother     54s, smokers    ALLERGIES:  is allergic to celebrex [celecoxib].  MEDICATIONS:  Scheduled Meds: . feeding supplement (ENSURE ENLIVE)  237 mL Oral BID BM  . mouth rinse  15 mL Mouth Rinse BID  . metoprolol tartrate  12.5 mg Oral BID  . nystatin  5 mL Oral QID  . pantoprazole  40 mg Oral BID  . peg 3350 powder  0.5 kit Oral Once   And  . peg 3350 powder  0.5 kit Oral Once  . senna-docusate  1 tablet Oral BID  . sodium chloride flush  3 mL Intravenous Q12H   Continuous  Infusions:  PRN Meds:.acetaminophen, oxyCODONE, zolpidem  REVIEW OF SYSTEMS:    10 Point review of Systems was done is negative except as noted above.   LABORATORY DATA:  I have reviewed the data as listed  . CBC Latest Ref Rng & Units 03/13/2016 03/13/2016 03/12/2016  WBC 4.0 - 10.5 K/uL - - -  Hemoglobin 13.0 - 17.0 g/dL 8.3(L) 7.9(L) 8.4(L)  Hematocrit 39.0 - 52.0 % - - -  Platelets 150 - 400 K/uL - - -    . CMP Latest Ref Rng & Units 03/12/2016 03/11/2016 03/10/2016  Glucose 65 - 99 mg/dL 108(H) 119(H) 108(H)  BUN 6 - 20 mg/dL 33(H) 48(H) 56(H)  Creatinine 0.61 - 1.24 mg/dL 1.38(H) 1.37(H) 1.33(H)  Sodium 135 - 145 mmol/L 141 139 139  Potassium 3.5 - 5.1 mmol/L 3.9 4.0 4.0  Chloride 101 - 111 mmol/L 110 111 113(H)  CO2 22 - 32 mmol/L 26 22 21(L)  Calcium 8.9 - 10.3 mg/dL 8.2(L) 8.2(L) 8.2(L)  Total Protein 6.5 - 8.1 g/dL - - -  Total Bilirubin 0.3 - 1.2 mg/dL - - -  Alkaline Phos 38 - 126 U/L - - -  AST 15 - 41 U/L - - -  ALT 17 - 63 U/L - - -     RADIOGRAPHIC STUDIES: I have personally reviewed the radiological images as listed and agreed with the findings in the report. Dg Chest 2 View  Result Date: 03/09/2016 CLINICAL DATA:  Shortness of Breath, lung cancer EXAM: CHEST  2 VIEW COMPARISON:  03/05/2016 FINDINGS: Cardiomediastinal silhouette is stable. Persistent left hilar prominence. Persistent left perihilar interstitial prominence probable interstitial tumor spread or pneumonitis. Peripheral consolidation in left upper lobe is stable. Mild hyperinflation. Right lung is clear. Stable sclerotic lesion in right fourth rib. IMPRESSION: Persistent left hilar prominence. Persistent left perihilar interstitial prominence probable interstitial tumor spread or pneumonitis. Peripheral consolidation in left upper lobe is stable. Mild hyperinflation. Right lung is clear. Electronically Signed   By: Lahoma Crocker M.D.   On: 03/09/2016 15:21   Dg Chest 2 View  Result Date:  02/27/2016 CLINICAL DATA:  Short of breath on exertion. Symptoms for 2 months intermittently. History of hypertension. EXAM: CHEST  2 VIEW COMPARISON:  10/17/2014 FINDINGS: There is left sided irregular interstitial thickening with a focal area of opacity which projects in the left upper lobe lateral to the left hilum. This is new since the prior exam. Right lung is essentially clear and unchanged from the prior study. Lungs are hyperexpanded with flattened hemidiaphragms consistent with COPD. Minimal left pleural effusion is suggested. No pneumothorax. Cardiac silhouette is normal in size. No  mediastinal or hilar masses or convincing adenopathy. Skeletal structures are demineralized but intact. IMPRESSION: 1. Irregularly thickened interstitial markings on the left with an ill-defined focal opacity noted in the left upper lobe, new from the prior study. No acute findings in the right lung. Findings may reflect asymmetric interstitial edema or interstitial infection. Neoplastic disease is not excluded given the focal opacity in the left upper lobe. Consider follow-up chest CT with contrast. 2. Underlying COPD.  No acute findings in the right lung. Electronically Signed   By: Lajean Manes M.D.   On: 02/27/2016 13:32   Ct Angio Chest Pe W Or Wo Contrast  Addendum Date: 02/29/2016   ADDENDUM REPORT: 02/29/2016 21:09 ADDENDUM: I spoke to Dr. Elease Hashimoto. Electronically Signed   By: Dorise Bullion III M.D   On: 02/29/2016 21:09   Addendum Date: 02/29/2016   ADDENDUM REPORT: 02/29/2016 21:06 ADDENDUM: Findings called to the patient's physician. Electronically Signed   By: Dorise Bullion III M.D   On: 02/29/2016 21:06   Result Date: 02/29/2016 CLINICAL DATA:  Shortness of breath. Elevated D-dimer. No history of cancer. EXAM: CT ANGIOGRAPHY CHEST WITH CONTRAST TECHNIQUE: Multidetector CT imaging of the chest was performed using the standard protocol during bolus administration of intravenous contrast.  Multiplanar CT image reconstructions and MIPs were obtained to evaluate the vascular anatomy. CONTRAST:  64 mL of Isovue 370 COMPARISON:  CT scan Oct 17, 2014 and chest x-ray February 27, 2016 FINDINGS: Cardiovascular: The thoracic aorta is normal in caliber. No dissection. There is new adenopathy in the mediastinum, particularly to the left. An AP window node on image 46 measures 2.3 by 3.3 cm. There are also some abnormal lymph nodes, larger in the interval, to the right of the trachea. The right hilum is spared but there is bulky adenopathy in the left hilum. No adenopathy in the bilateral axilla. There is a borderline node, larger in the interval, in the base of the neck on the left. The timing of contrast was mildly limited limiting evaluation of the pulmonary arteries beyond the segmental branch level. Within this limitation, there appear to be a few small pulmonary emboli. The best example is in the right upper lobe on series 9, image 97. I suspect another in the right lower lobe on image 154. The left-sided pulmonary arteries are surrounded by adenopathy. Very tiny embolus may be seen in the left lower lobe on axial image 142 no other emboli definitively identified. Mediastinum/Nodes: Mediastinal and left hilar adenopathy as above. The trachea and mainstem bronchi are normal. There is a small left effusion. No right effusion. There is a small anterior pericardial effusion. Lungs/Pleura: Scarring is seen in the medial right apex with a small calcified granuloma in the right apex. A few scattered tiny nodules on the right are stable and too small to characterize. No definitive new nodules on the right. The nodule in the right upper lobe on the previous study has resolved. There is a peripheral opacity in the left lung measuring 3.8 x 2.2 cm on image 29. Interlobular septal thickening and ground-glass opacity surrounds this abnormality. There is also extension towards the hila as seen on image 31. There is a  nodule in the lingula on image 40 which is new measuring 7 mm. A few other scattered nodules are seen in the lingula, also new. There is a small anterior pericardial effusion Upper Abdomen: There is a stable left adrenal nodule which is heterogeneous measuring 3.4 by 3.2 cm today versus 3.2 by  3.2 cm previously, not significantly changed. The attenuation is up to 15 Hounsfield units. The right adrenal gland is normal. Renal cysts are noted. No other abnormalities in the upper abdomen. Musculoskeletal: Suggested lytic lesion in thea right humeral head. No other evidence of bony metastatic disease. Review of the MIP images confirms the above findings. IMPRESSION: 1. Two or 3 small pulmonary emboli. 2. There is a peripheral opacity in the left upper lobe with surrounding interlobular septal thickening and ground-glass opacity in addition to several scattered nodules throughout the left upper lobe. These findings are new. While an infectious etiology is possible, the findings are highly suggestive of malignancy with satellite nodules and lymphangitic spread of tumor. 3. New adenopathy in the mediastinum and left hilum. 4. Suspected lytic lesion in the right humeral head. 5. Left adrenal nodule. While the stability in size is reassuring, the left adrenal nodule is indeterminate on this study. Findings being called to referring physician. Electronically Signed: By: Dorise Bullion III M.D On: 02/29/2016 16:14   Mr Brain W Wo Contrast  Result Date: 03/10/2016 CLINICAL DATA:  Metastatic disease.  Brain staging. EXAM: MRI HEAD WITHOUT AND WITH CONTRAST TECHNIQUE: Multiplanar, multiecho pulse sequences of the brain and surrounding structures were obtained without and with intravenous contrast. CONTRAST:  23m MULTIHANCE GADOBENATE DIMEGLUMINE 529 MG/ML IV SOLN COMPARISON:  None. FINDINGS: Brain: No abnormal intracranial enhancement to suggest metastatic disease. The right frontal extra-axial low-density appearance on  previous PET-CT reflects a small hygroma without significant mass effect. Small hygroma also present around the right cerebellum. Age congruent brain volume and white matter appearance. No hydrocephalus. Vascular: Normal flow voids. Skull and upper cervical spine: No evidence of metastasis Sinuses/Orbits: Bilateral cataract resection. IMPRESSION: Negative for intracranial metastasis. The right frontal finding on previous PET-CT is a small hygroma. Electronically Signed   By: JMonte FantasiaM.D.   On: 03/10/2016 11:15   Nm Pet Image Initial (pi) Skull Base To Thigh  Result Date: 03/05/2016 CLINICAL DATA:  Initial treatment strategy for lung nodule. EXAM: NUCLEAR MEDICINE PET SKULL BASE TO THIGH TECHNIQUE: 12.3 mCi F-18 FDG was injected intravenously. Full-ring PET imaging was performed from the skull base to thigh after the radiotracer. CT data was obtained and used for attenuation correction and anatomic localization. COMPARISON:  Multiple exams, including 02/29/2016 FINDINGS: NECK Please note that inadvertently the entire head was included. There is asymmetric hypodensity along the right frontal subdural space which could be from subdural hygroma or chronic subdural hematoma, MRI of the brain with and without contrast should be considered for further assessment. I do not see a large mass in the brain on the PET images. There is pathologic supraclavicular adenopathy with a 9 mm left supraclavicular node on image 78 78/4 having a maximum standard uptake value of 10.1 and a cluster of small right supraclavicular nodes having a maximum standard uptake value of 8.5 CHEST There is pathologic upper paratracheal, lower paratracheal, AP window, prevascular, and subcarinal hypermetabolic adenopathy in addition to left hilar and infrahilar adenopathy in the chest. Index AP window lymph node 1.5 cm in short axis with maximum standard uptake value 25.5. Subcarinal lymph node 2.0 cm in short axis with maximum standard  uptake value 19.0. There is patchy airspace opacity with nodularity in the left upper lobe which has significantly hypermetabolic components, maximum standard uptake value 24.0 faintly hypermetabolic activity in the left lower lobe, from inflammation or possibly lymphangitic spread. Trace left pleural effusion, nonspecific for malignancy. Severe emphysema. Coronary, aortic arch,  and branch vessel atherosclerotic vascular disease. ABDOMEN/PELVIS The left adrenal gland primarily has low density characteristics typical for adenoma, with central density measurement of 1 Hounsfield unit. However, there is marginal high activity along the left side of the adrenal gland with maximum SUV up to 8.7, raise the possibility of a collision lesion with early metastatic disease to the left adrenal gland. Within the prostate gland, there is a central focus of high activity with maximum SUV of 19.15. All this could possibly be from prostatic urethral urine activity rather than tumor, there is also a left anterior prostate base focus of accentuated activity with maximum and standard uptake value of 17.7. Appearance concerning for prostate malignancy. Suspected left scrotal hydrocele. Bilateral renal cysts. Sigmoid colon diverticulosis. SKELETON 5 cm metastatic lesion in the right proximal humerus mainly involving the humeral head, maximum SUV 49.6. Lucent metastatic lesion of the left T10 vertebra, maximum SUV 36.7. IMPRESSION: 1. Primarily left upper lobe infiltrative hypermetabolic mass with extensive mediastinal and left hilar hypermetabolic adenopathy, osseous metastatic lesions to the right humeral head and left T10 vertebra, and possible collision metastatic lesion of the left adrenal gland with an adrenal adenoma. 2. Central and left anterior base hypermetabolic activity in the prostate gland, concerning for prostate cancer. 3. Asymmetric extra-axial hypodensity along the right frontal lobe, possibly a chronic subdural  hematoma or cystic hygroma, under the circumstances I recommend MRI of the brain with and without contrast for further workup and to assess for intracranial metastatic disease. Other imaging findings of potential clinical significance: Severe emphysema. Coronary, aortic arch, and branch vessel atherosclerotic vascular disease. Aortoiliac atherosclerotic vascular disease. Bilateral renal cysts. Sigmoid colon diverticulosis. Left scrotal hydrocele. Electronically Signed   By: Van Clines M.D.   On: 03/05/2016 15:34   US Biopsy  Result Date: 03/12/2016 INDICATION: 81 year old with an infiltrative left upper lobe lesion. Concern for metastatic lung cancer and tissue diagnosis is needed. There are hypermetabolic supraclavicular lymph nodes. EXAM: ULTRASOUND-GUIDED CORE BIOPSIES AND FINE-NEEDLE ASPIRATIONS OF RIGHT SUPRACLAVICULAR LYMPH NODE MEDICATIONS: None. ANESTHESIA/SEDATION: Moderate (conscious) sedation was employed during this procedure. A total of Versed 1.0 mg and Fentanyl 50 mcg was administered intravenously. Moderate Sedation Time: 21 minutes. The patient's level of consciousness and vital signs were monitored continuously by radiology nursing throughout the procedure under my direct supervision. FLUOROSCOPY TIME:  None COMPLICATIONS: None immediate. PROCEDURE: Informed written consent was obtained from the patient after a thorough discussion of the procedural risks, benefits and alternatives. All questions were addressed. A timeout was performed prior to the initiation of the procedure. Both sides of the neck evaluated with ultrasound. Small bilateral supraclavicular lymph nodes were identified. Right supraclavicular lymph node adjacent to the right internal jugular vein was felt to be most amendable to biopsy. Right side of the neck was prepped with chlorhexidine. Skin was anesthetized with 1% lidocaine. A total of 6 ultrasound-guided fine-needle aspirations were obtained within this lymph node  with 25 gauge needles. Following the fine-needle aspirations, 3 core biopsies were obtained with a 20 gauge core device using ultrasound guidance. Specimens placed in formalin. Bandage placed over the puncture site. FINDINGS: Small bilateral supraclavicular lymph nodes. A round hypoechoic lymph node just lateral to the right internal jugular vein was sampled. This node measures roughly 1 cm in the short axis. Biopsy needles confirmed within the lesion. IMPRESSION: Ultrasound-guided core biopsies and fine-needle aspirations of right supraclavicular lymph node. Electronically Signed   By: Markus Daft M.D.   On: 03/12/2016 13:15  ASSESSMENT & PLAN:   81 year old Caucasian male with history of smoking about 60 pack years with   #1 Metastatic left upper lobe lung cancer with mediastinal metastases and metastasis to the right humeral head and T10 vertebra . PET/CT scan as noted above . MRI brain negative for metastatic disease  Plan  -s/p US guided biopsy of supraclavicular LN -- awaiting final pathology result - Prelim discussion with Dr Tresa Moore today -- some large appearing malignant cells  -We will likely need Xgeva for bony metastases -I reviewed all information with the patient his daughter and wife. -I also reviewed the actual PET CT scan images with them.  #2 right humeral head metastasis with pain Plan  -Consider shoulder sling for pain control . -When necessary liq oxycodone ordered for neoplasm related pain . -Depending on final tissue diagnosis and selected systemic treatment option might be to consider palliative radiation therapy to the right humeral metastatic process to help with pain management .  #3  recent acute pulmonary embolism likely related to the patient's metastatic lung cancer  -Patient was on Xarelto which is on hold due to acute GI bleed . -Would consider ultrasound of the lower extremities to rule out high-risk blood clots that might cause significant new pulmonary  embolism . -Would hope to restart anticoagulation once GI bleeding issues has been resolved .  #4  acute GI bleeding with melena .EGD unrevealing Plan  -Transfuse when necessary to keep hemoglobin close to 9 . -off blood thinners currently -plan for colonoscopy +/- capsule endoscopy tomorrow.  I shall be out of town till Tuesday. I will setup patient to be seen clinic with me in about 7-10 days   I spent 30 minutes counseling the patient face to face. The total time spent in the appointment was 35 minutes and more than 50% was on counseling and direct patient cares.    Sullivan Lone MD [place] AAHIVMS [facility] Hematology/Oncology Physician [facility]  (Office):       [phone] (Work cell):  [phone] (Fax):           [phone]  03/13/2016 4:49 PM

## 2016-03-14 ENCOUNTER — Encounter (HOSPITAL_COMMUNITY): Payer: Self-pay

## 2016-03-14 ENCOUNTER — Encounter (HOSPITAL_COMMUNITY)
Admission: EM | Disposition: A | Discharge status: Home / Self Care | Payer: Self-pay | Source: Home / Self Care | Attending: Family Medicine

## 2016-03-14 ENCOUNTER — Telehealth: Payer: Self-pay | Admitting: Hematology

## 2016-03-14 ENCOUNTER — Inpatient Hospital Stay (HOSPITAL_COMMUNITY): Payer: Medicare Other

## 2016-03-14 DIAGNOSIS — Z86711 Personal history of pulmonary embolism: Secondary | ICD-10-CM

## 2016-03-14 DIAGNOSIS — I82403 Acute embolism and thrombosis of unspecified deep veins of lower extremity, bilateral: Secondary | ICD-10-CM | POA: Diagnosis present

## 2016-03-14 DIAGNOSIS — M109 Gout, unspecified: Secondary | ICD-10-CM | POA: Diagnosis present

## 2016-03-14 DIAGNOSIS — C3490 Malignant neoplasm of unspecified part of unspecified bronchus or lung: Secondary | ICD-10-CM | POA: Diagnosis present

## 2016-03-14 HISTORY — PX: GIVENS CAPSULE STUDY: SHX5432

## 2016-03-14 HISTORY — PX: COLONOSCOPY: SHX5424

## 2016-03-14 LAB — CBC WITH DIFFERENTIAL/PLATELET
Basophils Absolute: 0 10*3/uL (ref 0.0–0.1)
Basophils Relative: 0 %
EOS PCT: 0 %
Eosinophils Absolute: 0 10*3/uL (ref 0.0–0.7)
HCT: 25.7 % — ABNORMAL LOW (ref 39.0–52.0)
Hemoglobin: 8.7 g/dL — ABNORMAL LOW (ref 13.0–17.0)
LYMPHS ABS: 1.1 10*3/uL (ref 0.7–4.0)
LYMPHS PCT: 8 %
MCH: 30.9 pg (ref 26.0–34.0)
MCHC: 33.9 g/dL (ref 30.0–36.0)
MCV: 91.1 fL (ref 78.0–100.0)
MONO ABS: 0.9 10*3/uL (ref 0.1–1.0)
Monocytes Relative: 7 %
Neutro Abs: 12.1 10*3/uL — ABNORMAL HIGH (ref 1.7–7.7)
Neutrophils Relative %: 85 %
PLATELETS: 224 10*3/uL (ref 150–400)
RBC: 2.82 MIL/uL — ABNORMAL LOW (ref 4.22–5.81)
RDW: 16.5 % — AB (ref 11.5–15.5)
WBC: 14.1 10*3/uL — ABNORMAL HIGH (ref 4.0–10.5)

## 2016-03-14 LAB — TYPE AND SCREEN
ABO/RH(D): A NEG
Antibody Screen: NEGATIVE
Unit division: 0

## 2016-03-14 LAB — BASIC METABOLIC PANEL
Anion gap: 8 (ref 5–15)
BUN: 31 mg/dL — AB (ref 6–20)
CALCIUM: 8.4 mg/dL — AB (ref 8.9–10.3)
CO2: 23 mmol/L (ref 22–32)
Chloride: 112 mmol/L — ABNORMAL HIGH (ref 101–111)
Creatinine, Ser: 1.31 mg/dL — ABNORMAL HIGH (ref 0.61–1.24)
GFR calc Af Amer: 58 mL/min — ABNORMAL LOW (ref >60)
GFR, EST NON AFRICAN AMERICAN: 50 mL/min — AB (ref >60)
GLUCOSE: 127 mg/dL — AB (ref 65–99)
Potassium: 3.9 mmol/L (ref 3.5–5.1)
Sodium: 143 mmol/L (ref 135–145)

## 2016-03-14 LAB — HEPARIN LEVEL (UNFRACTIONATED): Heparin Unfractionated: 0.46 IU/mL (ref 0.30–0.70)

## 2016-03-14 SURGERY — COLONOSCOPY
Anesthesia: Moderate Sedation

## 2016-03-14 MED ORDER — HEPARIN (PORCINE) IN NACL 100-0.45 UNIT/ML-% IJ SOLN
1350.0000 [IU]/h | INTRAMUSCULAR | Status: AC
  Administered 2016-03-14: 1400 [IU]/h via INTRAVENOUS
  Administered 2016-03-15: 1350 [IU]/h via INTRAVENOUS
  Administered 2016-03-15: 1400 [IU]/h via INTRAVENOUS
  Filled 2016-03-14 (×3): qty 250

## 2016-03-14 MED ORDER — FENTANYL CITRATE (PF) 100 MCG/2ML IJ SOLN
INTRAMUSCULAR | Status: AC
  Filled 2016-03-14: qty 2

## 2016-03-14 MED ORDER — MIDAZOLAM HCL 5 MG/5ML IJ SOLN
INTRAMUSCULAR | Status: DC | PRN
  Administered 2016-03-14: 1 mg via INTRAVENOUS
  Administered 2016-03-14: 2 mg via INTRAVENOUS
  Administered 2016-03-14: 1 mg via INTRAVENOUS

## 2016-03-14 MED ORDER — MIDAZOLAM HCL 5 MG/ML IJ SOLN
INTRAMUSCULAR | Status: AC
  Filled 2016-03-14: qty 2

## 2016-03-14 MED ORDER — HEPARIN BOLUS VIA INFUSION
2600.0000 [IU] | Freq: Once | INTRAVENOUS | Status: AC
  Administered 2016-03-14: 2600 [IU] via INTRAVENOUS
  Filled 2016-03-14: qty 2600

## 2016-03-14 MED ORDER — SODIUM CHLORIDE 0.9 % IV SOLN
INTRAVENOUS | Status: DC
  Administered 2016-03-14: 09:00:00 via INTRAVENOUS

## 2016-03-14 MED ORDER — FENTANYL CITRATE (PF) 100 MCG/2ML IJ SOLN
INTRAMUSCULAR | Status: DC | PRN
  Administered 2016-03-14 (×3): 25 ug via INTRAVENOUS

## 2016-03-14 NOTE — Progress Notes (Signed)
PROGRESS NOTE  Tim TORNOW OHY:[id number] DOB: 10/23/1934 DOA: 03/09/2016 PCP: Garret Reddish, MD  Brief Summary:  Recent diagnosis of PE started on xarelto a week ago, recent diganosis of metastatic cancer a few days ago, sent from [facility] due to blood loss anemia, prbc transfusion, LBGI /oncology consulted.    To have imaging guided lymphanode biopsy on 10/25, colonoscopy on 10/27.     Assessment/Plan: Active Problems:   Anemia   GI bleed   Melena   Acute blood loss anemia   Pulmonary embolus (HCC)   Lung cancer (HCC)   Neoplasm related pain   Malnutrition of moderate degree  Acute blood loss anemia/suyptomatic anemia:  like from gi bleed after started on anticoagulation a few weeks ago. plt wnl, INR wnl. Report noticed dark stool in the last few days, FOBT test positive. Also reports large black BM 10/24.  He denies abdominal pain, no n/v.  hgb 6.2 on admission, it was 12.3 a week ago, s/p two units prbc on 10/22, and prbcx1 on 10/23 , monitor hfb, transfuse to keep hgb >9 per hematology /oncology Dr Irene Limbo.   EGD10/24 unremarkable, LBGI plan to perform colonoscopy on 10/27.  Hg trending down again, will transfuse 1 unit [facility] 10/26.    HTN: hold home meds cozaar, in the setting of elevated cr and sinus tachycardia, will try low dose betablocker with holding parameters.  New diagnosis of PE, CTA from 10/13 showed 'Two or 3 small pulmonary emboli." no hypoxia, bp stable,  hold xarelto in the setting of acute blood loss anemia.   I spoke with GI after colonoscopy and they felt like it was safe to restart anticoagulation, would start heparin IV and monitor for bleeding, asked pharmacy to assist with heparin dosing. Monitor over weekend for further GI bleeding.    Metastatic cancer,  new diagnosis, lung vs prostate primary with bone mets,  PET scan on 10/18 showed lung mass, bone mets and possible prostate involvement. 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  psa 0.93  proceed with LN biopsy on 10/25.  May need bronchoscopy if node biopsy not sufficient. Last dose  xarelto on 10/22 Oncology Dr Irene Limbo input appreciated.   Right shoulder pain: with known right humeral head mets, pain control, may need rad onc involvement. May benefit from a sling prior to discharge  Moderate protein calorie malnutrition - see dietitian recommendations.  Acute gout attack: resolved, he was taken off HCTZ. He only drinks alcohol occasionally.  Solumedrol IV given and pain much better today.  Leukocytosis - Pt was given dose of IV solumedrol on 10/26 for acute gout pain attack.   DVT prophylaxis: scds  Consultants:  LBGI  Oncology Dr Irene Limbo Intervention radiology for biopsy  Code Status: full   Family Communication:  Patient, his wife and daughter at bedside  Disposition Plan: watch for bleeding over weekend   Procedures:  EGD planned on 10/24, colonoscopy planned on 10/26  Imaging guided biopsy of supraclavicular or lower cervical lymph node  On 10/25  prbc transfusion  Colonoscopy 10/27  Antibiotics:  none  Objective: BP (!) 127/58   Pulse 90   Temp 97.9 F (36.6 C) (Axillary)   Resp 16   Ht '6\' 1"'$  (1.854 m)   Wt 88.5 kg (195 lb)   SpO2  91%   BMI 25.73 kg/m   Intake/Output Summary (Last 24 hours) at 03/14/16 1132 Last data filed at 03/13/16 1740  Gross per 24 hour  Intake          1187.17 ml  Output              600 ml  Net           587.17 ml   Filed Weights   03/09/16 1432 03/11/16 1326  Weight: 88.5 kg (195 lb) 88.5 kg (195 lb)    Exam:   General:  NAD, awake, alert, cooperative  Cardiovascular: RRR normal s1, s2 sounds  Respiratory: CTABL  Abdomen: Soft/ND/NT, positive BS  Musculoskeletal: No Edema,  right shoulder limited range of motion due to pain  Neuro: aaox3  Data Reviewed: Basic Metabolic Panel:  Recent Labs Lab 03/09/16 1456 03/10/16 0759 03/11/16 0120 03/12/16 0404 03/14/16 0538  NA 138 139 139 141 143  K 4.1 4.0 4.0 3.9 3.9  CL 109 113* 111 110 112*  CO2 21* 21* '22 26 23  '$ GLUCOSE 158* 108* 119* 108* 127*  BUN 63* 56* 48* 33* 31*  CREATININE 1.60* 1.33* 1.37* 1.38* 1.31*  CALCIUM 8.6* 8.2* 8.2* 8.2* 8.4*  MG 2.0  --   --   --   --    Liver Function Tests:  Recent Labs Lab 03/09/16 1456  AST 22  ALT 18  ALKPHOS 40  BILITOT 0.1*  PROT 6.0*  ALBUMIN 3.0*   No results for input(s): LIPASE, AMYLASE in the last 168 hours. No results for input(s): AMMONIA in the last 168 hours. CBC:  Recent Labs Lab 03/09/16 1456 03/09/16 1938 03/10/16 0759  03/12/16 0404  03/12/16 2003 03/13/16 0146 03/13/16 0854 03/13/16 2007 03/14/16 0538  WBC 13.2*  --  13.7*  --  12.1*  --   --   --   --   --  14.1*  NEUTROABS 10.2*  --   --   --   --   --   --   --   --   --  12.1*  HGB 6.2* 5.8* 7.7*  < > 7.9*  < > 8.4* 7.9* 8.3* 9.3* 8.7*  HCT 19.1* 17.4* 22.7*  --  24.1*  --   --   --   --  27.3* 25.7*  MCV 99.0  --  91.9  --  93.8  --   --   --   --   --  91.1  PLT 413*  --  313  --  248  --   --   --   --   --  224  < > = values in this interval not displayed. Cardiac Enzymes:    Recent Labs Lab 03/09/16 1456  TROPONINI <0.03   BNP (last 3 results)  Recent Labs  03/09/16 1456  BNP 21.9    ProBNP (last 3 results)  Recent Labs  02/27/16 1031  PROBNP 37.0    CBG: No results for input(s): GLUCAP in the last 168 hours.  Recent Results (from the past 240 hour(s))  Urine culture     Status: None   Collection Time: 03/09/16  5:54 PM  Result Value Ref Range Status   Specimen Description URINE, RANDOM  Final   Special Requests NONE  Final   Culture NO GROWTH Performed at [facility]   Final   Report Status 03/11/2016 FINAL  Final     Studies: No results found.  Scheduled  Meds: . feeding supplement (ENSURE ENLIVE)  237 mL Oral BID BM  . heparin  2,600 Units Intravenous Once  . mouth rinse  15 mL Mouth Rinse BID  . metoprolol tartrate  12.5 mg Oral BID  . nystatin  5 mL Oral QID  . pantoprazole  40 mg Oral BID  . senna-docusate  1 tablet Oral BID  . sodium chloride flush  3 mL Intravenous Q12H   Continuous Infusions: . heparin     Time spent: 15 mins  Irwin Brakeman MD [facility] Pager [phone]. If 7PM-7AM, please contact night-coverage at [email or web address], password [facility] 03/14/2016, 11:32 AM  LOS: 5 days

## 2016-03-14 NOTE — Progress Notes (Signed)
Reviewed instructions with pt and wife regarding capsule study and when pt can begin diet.  Pt and wife verbalized understanding.  Pt swallowed capsule at 78:67JQ without complications.  Written instructions given to Lesly Rubenstein, RN and to wife.

## 2016-03-14 NOTE — Op Note (Signed)
[facility] Patient Name: Tim Ross Procedure Date: 03/14/2016 MRN: [id number] Attending MD: Estill Cotta. Loletha Carrow , MD Date of Birth: Apr 02, 1935 CSN: [id number] Age: 81 Admit Type: Inpatient Procedure:                Colonoscopy Indications:              Melena, Acute post hemorrhagic anemia Providers:                Mallie Mussel L. Loletha Carrow, MD, Cleda Daub, RN, Cherylynn Ridges, Technician Referring MD:              Medicines:                Fentanyl 75 micrograms IV, Midazolam 4 mg IV Complications:            No immediate complications. Estimated Blood Loss:     Estimated blood loss: none. Procedure:                Pre-Anesthesia Assessment:                           - Prior to the procedure, a History and Physical                            was performed, and patient medications and                            allergies were reviewed. The patient's tolerance of                            previous anesthesia was also reviewed. The risks                            and benefits of the procedure and the sedation                            options and risks were discussed with the patient.                            All questions were answered, and informed consent                            was obtained. Prior Anticoagulants: The patient has                            taken Eliquis (apixaban), last dose was 4 days                            prior to procedure. ASA Grade Assessment: III - A                            patient with severe systemic disease. After  reviewing the risks and benefits, the patient was                            deemed in satisfactory condition to undergo the                            procedure.                           After obtaining informed consent, the colonoscope                            was passed under direct vision. Throughout the                            procedure, the patient's blood  pressure, pulse, and                            oxygen saturations were monitored continuously. The                            EC-[id number] ([id number]) scope was introduced through                            the anus and advanced to the the cecum, identified                            by appendiceal orifice and ileocecal valve. The                            colonoscopy was performed with moderate difficulty                            due to a tortuous colon. The patient tolerated the                            procedure well. The quality of the bowel                            preparation was poor. The ileocecal valve,                            appendiceal orifice, and rectum were photographed.                            The bowel preparation used was MoviPrep. The                            quality of the bowel preparation was evaluated                            using the BBPS [facility] Bowel Preparation Scale)  with scores of: Right Colon = 1, Transverse Colon =                            1 and Left Colon = 1. The total BBPS score equals 3. Scope In: 9:46:16 AM Scope Out: 9:58:57 AM Scope Withdrawal Time: 0 hours 6 minutes 15 seconds  Total Procedure Duration: 0 hours 12 minutes 41 seconds  Findings:      The digital rectal exam findings include enlarged prostate.      Many large-mouthed diverticula were found in the entire colon.      Retroflexion in the rectum was not performed due to anatomy. Impression:               - Preparation of the colon was poor.                           - Enlarged prostate found on digital rectal exam.                           - Diverticulosis in the entire examined colon.                           - No specimens collected.                           Possible right-sided diverticular bleed. However,                            distal small bowel source must be rule out (recent                            normal EGD) Moderate  Sedation:      Moderate (conscious) sedation was administered by the endoscopy nurse       and supervised by the endoscopist. The following parameters were       monitored: oxygen saturation, heart rate, blood pressure, respiratory       rate, EKG, adequacy of pulmonary ventilation, and response to care.       Total physician intraservice time was 15 minutes. Recommendation:           - Begin clear liquid diet 2 hrs after capsule                            ingestion, regular diet 4 hours after capsule                            ingestion.                           - Begin unfractionated or LMW heparin today (will                            discuss with hospitalist team).                           - To visualize the small bowel, perform video  capsule endoscopy today.                           - No recommendation at this time regarding repeat                            colonoscopy. Procedure Code(s):        --- Professional ---                           [phone], Colonoscopy, flexible; diagnostic, including                            collection of specimen(s) by brushing or washing,                            when performed (separate procedure)                           99152, Moderate sedation services provided by the                            same physician or other qualified health care                            professional performing the diagnostic or                            therapeutic service that the sedation supports,                            requiring the presence of an independent trained                            observer to assist in the monitoring of the                            patient's level of consciousness and physiological                            status; initial 15 minutes of intraservice time,                            patient age 32 years or older Diagnosis Code(s):        --- Professional ---                           K92.1,  Melena (includes Hematochezia)                           D62, Acute posthemorrhagic anemia                           K57.30, Diverticulosis of large intestine without  perforation or abscess without bleeding                           N40.0, Benign prostatic hyperplasia without lower                            urinary tract symptoms CPT copyright 2016 American Medical Association. All rights reserved. The codes documented in this report are preliminary and upon coder review may  be revised to meet current compliance requirements. Travontae Freiberger L. Loletha Carrow, MD 03/14/2016 10:11:31 AM This report has been signed electronically. Number of Addenda: 0

## 2016-03-14 NOTE — Telephone Encounter (Signed)
Communicated with

## 2016-03-14 NOTE — Progress Notes (Signed)
*  Preliminary Results* Bilateral lower extremity venous duplex completed. Bilateral lower extremities are positive for acute deep vein thrombosis involving the right peroneal veins, right gastrocnemius veins, left posterior tibial veins, left peroneal veins, and left gastrocnemius veins. There is no evidence of Baker's cyst bilaterally.  Preliminary results discussed with Dr. Wynetta Emery.  03/14/2016 12:17 PM Maudry Mayhew, BS, RVT, RDCS, RDMS

## 2016-03-14 NOTE — Progress Notes (Signed)
ANTICOAGULATION CONSULT NOTE - Initial Consult  Pharmacy Consult for heparin drip Indication: pulmonary embolus  Allergies  Allergen Reactions  . Celebrex [Celecoxib] Hives    Patient Measurements: Height: '6\' 1"'$  (185.4 cm) Weight: 195 lb (88.5 kg) IBW/kg (Calculated) : 79.9 Heparin Dosing Weight: 88.5kg  Vital Signs: Temp: 97.9 F (36.6 C) (10/27 1014) Temp Source: Axillary (10/27 1014) BP: 131/78 (10/27 1020) Pulse Rate: 92 (10/27 1020)  Labs:  Recent Labs  03/12/16 0404  03/13/16 2007 03/14/16 0538  HGB 7.9*  < > 9.3* 8.7*  HCT 24.1*  --  27.3* 25.7*  PLT 248  --   --  224  LABPROT 15.0  --   --   --   INR 1.17  --   --   --   CREATININE 1.38*  --   --  1.31*  < > = values in this interval not displayed.  Estimated Creatinine Clearance: 51.7 mL/min (by C-G formula based on SCr of 1.31 mg/dL (H)).   Medical History: Past Medical History:  Diagnosis Date  . Cancer ([place])   . Colonic polyp   . GERD (gastroesophageal reflux disease)   . Hyperlipidemia   . Hypertension   . Muscular degeneration   . Pneumonia 2002  . Testicular cyst    removed-noncancerous     Assessment: 81 yo male with metastatic lung cancer and recent acute PE (started on xarelto) presented with acute GI bleed.  Xarelto held since admission .  Pharmacy consulted to start heparin drip.  03/14/2016 LD xarelto 10/22 PT/INR WNL on 10/25 H/H low Plts WNL Scr 1.3 (CrCl ~ 51.64ms/min)  Goal of Therapy:  Heparin level 0.3-0.7 units/ml Monitor platelets by anticoagulation protocol   Plan:   Heparin load 2600 units then start  Heparin drip at 1400 units/hour  Check heparin level in 8 hours  Daily CBC   EDolly RiasRPh 03/14/2016, 11:14 AM Pager [phone]

## 2016-03-14 NOTE — Progress Notes (Signed)
ANTICOAGULATION CONSULT NOTE - Initial Consult  Pharmacy Consult for heparin drip Indication: pulmonary embolus  Allergies  Allergen Reactions  . Celebrex [Celecoxib] Hives   Patient Measurements: Height: '6\' 1"'$  (185.4 cm) Weight: 195 lb (88.5 kg) IBW/kg (Calculated) : 79.9 Heparin Dosing Weight: 88.5kg  Vital Signs: Temp: 97.8 F (36.6 C) (10/27 2043) Temp Source: Oral (10/27 2043) BP: 136/60 (10/27 2043) Pulse Rate: 98 (10/27 2043)  Labs:  Recent Labs  03/12/16 0404  03/13/16 2007 03/14/16 0538 03/14/16 1954  HGB 7.9*  < > 9.3* 8.7*  --   HCT 24.1*  --  27.3* 25.7*  --   PLT 248  --   --  224  --   LABPROT 15.0  --   --   --   --   INR 1.17  --   --   --   --   HEPARINUNFRC  --   --   --   --  0.46  CREATININE 1.38*  --   --  1.31*  --   < > = values in this interval not displayed.  Estimated Creatinine Clearance: 51.7 mL/min (by C-G formula based on SCr of 1.31 mg/dL (H)).  Medical History: Past Medical History:  Diagnosis Date  . Cancer ([place])   . Colonic polyp   . GERD (gastroesophageal reflux disease)   . Hyperlipidemia   . Hypertension   . Muscular degeneration   . Pneumonia 2002  . Testicular cyst    removed-noncancerous   Assessment: 81 yo male with metastatic lung cancer and recent acute PE (started on xarelto) presented with acute GI bleed.  Xarelto held since admission .  Pharmacy consulted to start heparin drip.  Today, 03/14/2016  LD xarelto 10/22  PT/INR WNL on 10/25, H/H low, Plts WNL. Scr 1.3 (CrCl ~ 51.68ms/min)  Heparin load 2600 units then start, Heparin drip at 1400 units/hour  First Heparin level in therapeutic range, 0.46 units/ml   Goal of Therapy:  Heparin level 0.3-0.7 units/ml Monitor platelets by anticoagulation protocol   Plan:   Continue Heparin at 1400 units/hr  Check heparin level in 8 hours  Daily CBC  GMinda DittoPharmD Pager [phone]/27/2017, 9:07 PM

## 2016-03-14 NOTE — Interval H&P Note (Signed)
History and Physical Interval Note:  03/14/2016 8:47 AM  Tim Ross  has presented today for surgery, with the diagnosis of melena  The various methods of treatment have been discussed with the patient and family. After consideration of risks, benefits and other options for treatment, the patient has consented to  Procedure(s): COLONOSCOPY (N/A) as a surgical intervention .  The patient's history has been reviewed, patient examined, no change in status, stable for surgery.  I have reviewed the patient's chart and labs.  Questions were answered to the patient's satisfaction.     Hgb stable, took bowel prep last night.  [name]

## 2016-03-15 ENCOUNTER — Encounter (HOSPITAL_COMMUNITY): Payer: Self-pay | Admitting: Family Medicine

## 2016-03-15 DIAGNOSIS — D649 Anemia, unspecified: Secondary | ICD-10-CM

## 2016-03-15 DIAGNOSIS — I824Z3 Acute embolism and thrombosis of unspecified deep veins of distal lower extremity, bilateral: Secondary | ICD-10-CM

## 2016-03-15 DIAGNOSIS — M109 Gout, unspecified: Secondary | ICD-10-CM

## 2016-03-15 DIAGNOSIS — C3490 Malignant neoplasm of unspecified part of unspecified bronchus or lung: Secondary | ICD-10-CM

## 2016-03-15 LAB — HEPARIN LEVEL (UNFRACTIONATED)
HEPARIN UNFRACTIONATED: 0.68 [IU]/mL (ref 0.30–0.70)
HEPARIN UNFRACTIONATED: 0.4 [IU]/mL (ref 0.30–0.70)

## 2016-03-15 LAB — CBC
HCT: 26.4 % — ABNORMAL LOW (ref 39.0–52.0)
HEMOGLOBIN: 8.7 g/dL — AB (ref 13.0–17.0)
MCH: 30.4 pg (ref 26.0–34.0)
MCHC: 33 g/dL (ref 30.0–36.0)
MCV: 92.3 fL (ref 78.0–100.0)
PLATELETS: 250 10*3/uL (ref 150–400)
RBC: 2.86 MIL/uL — AB (ref 4.22–5.81)
RDW: 16.5 % — ABNORMAL HIGH (ref 11.5–15.5)
WBC: 14.6 10*3/uL — ABNORMAL HIGH (ref 4.0–10.5)

## 2016-03-15 LAB — BASIC METABOLIC PANEL
Anion gap: 6 (ref 5–15)
BUN: 34 mg/dL — ABNORMAL HIGH (ref 6–20)
CHLORIDE: 112 mmol/L — AB (ref 101–111)
CO2: 23 mmol/L (ref 22–32)
CREATININE: 1.44 mg/dL — AB (ref 0.61–1.24)
Calcium: 8 mg/dL — ABNORMAL LOW (ref 8.9–10.3)
GFR, EST AFRICAN AMERICAN: 52 mL/min — AB (ref >60)
GFR, EST NON AFRICAN AMERICAN: 45 mL/min — AB (ref >60)
Glucose, Bld: 98 mg/dL (ref 65–99)
Potassium: 3.9 mmol/L (ref 3.5–5.1)
SODIUM: 141 mmol/L (ref 135–145)

## 2016-03-15 NOTE — Progress Notes (Signed)
ANTICOAGULATION CONSULT NOTE - Follow Up Consult  Pharmacy Consult for Heparin Indication: pulmonary embolus  Allergies  Allergen Reactions  . Celebrex [Celecoxib] Hives    Patient Measurements: Height: '6\' 1"'$  (185.4 cm) Weight: 195 lb (88.5 kg) IBW/kg (Calculated) : 79.9 Heparin Dosing Weight:   Vital Signs: Temp: 98.1 F (36.7 C) (10/28 0514) Temp Source: Oral (10/28 0514) BP: 124/59 (10/28 0514) Pulse Rate: 91 (10/28 0514)  Labs:  Recent Labs  03/13/16 2007 03/14/16 0538 03/14/16 1954 03/15/16 0527  HGB 9.3* 8.7*  --  8.7*  HCT 27.3* 25.7*  --  26.4*  PLT  --  224  --  250  HEPARINUNFRC  --   --  0.46 0.68  CREATININE  --  1.31*  --  1.44*    Estimated Creatinine Clearance: 47 mL/min (by C-G formula based on SCr of 1.44 mg/dL (H)).   Medications:  Infusions:  . heparin 1,400 Units/hr (03/15/16 0242)    Assessment: Patient with heparin drip at goal.  No heparin issues or bleeding per RN.    Goal of Therapy:  Heparin level 0.3-0.7 units/ml Monitor platelets by anticoagulation protocol: Yes   Plan:  Will decrease heparin to 1350 units/hr--to keep within goal range Recheck heparin level at [place], Shea Stakes [place] 03/15/2016,6:33 AM

## 2016-03-15 NOTE — Progress Notes (Signed)
Pharmacy - IV heparin  Assessment:    Please see note from Raelene Bott, PharmD earlier today for full details.  Briefly, 81 y.o. male on IV heparin for recent PE   Heparin level remains therapeutic on 1350 units/hr  Plan:   Continue heparin drip at 1350 units/hr  Monitor for signs of bleeding or worsened thrombosis  Reuel Boom, PharmD, BCPS Pager: [phone] 03/15/2016, 5:00 PM

## 2016-03-15 NOTE — Progress Notes (Signed)
[place] [facility] Progress Note  Chief Complaint: Melena  Subjective  History:  The patient has had no overt GI bleeding since I last saw him for colonoscopy yesterday morning. He completed his video capsule study, and I looked at an earlier today. There is one or 2 nonbleeding AVMs in the mid small bowel, probably the proximal ileum. See the full report for details. I believe this explains his recent GI bleeding. He has now been on IV heparin for about 24 hours with no more overt bleeding. He denies abdominal pain, and his appetite has returned. His lymph node biopsy from 3 days ago has revealed metastatic non-small cell carcinoma.  ROS: Cardiovascular:  no chest pain Respiratory: no dyspnea  Objective:  Med list reviewed  Vital signs in last 24 hrs: Vitals:   03/14/16 2043 03/15/16 0514  BP: 136/60 (!) 124/59  Pulse: 98 91  Resp: 16 18  Temp: 97.8 F (36.6 C) 98.1 F (36.7 C)    Physical Exam   HEENT: sclera anicteric, oral mucosa moist without lesions  Neck: supple, no thyromegaly, JVD or lymphadenopathy  Cardiac: RRR without murmurs, S1S2 heard, no peripheral edema  Pulm: clear to auscultation bilaterally, normal RR and effort noted  Abdomen: soft, No tenderness, with active bowel sounds. No guarding or palpable hepatosplenomegaly  Skin; warm and dry, no jaundice or rash  Recent Labs:   Recent Labs Lab 03/12/16 0404  03/13/16 2007 03/14/16 0538 03/15/16 0527  WBC 12.1*  --   --  14.1* 14.6*  HGB 7.9*  < > 9.3* 8.7* 8.7*  HCT 24.1*  --  27.3* 25.7* 26.4*  PLT 248  --   --  224 250  < > = values in this interval not displayed.  Recent Labs Lab 03/09/16 1456  03/15/16 0527  NA 138  < > 141  K 4.1  < > 3.9  CL 109  < > 112*  CO2 21*  < > 23  BUN 63*  < > 34*  ALBUMIN 3.0*  --   --   ALKPHOS 40  --   --   ALT 18  --   --   AST 22  --   --   GLUCOSE 158*  < > 98  < > = values in this interval not displayed.  Recent Labs Lab 03/12/16 0404  INR  1.17   Radiologic studies: Lower extremity Doppler ultrasound from yesterday shows bilateral DVTs.  '@ASSESSMENTPLANBEGIN'$ @ Assessment:  Melena Anemia of acute GI blood loss Small bowel AVMs with bleeding Metastatic lung cancer Recent pulmonary embolism and now diagnosed with bilateral DVT   Plan:  It appears that he needs long-term oral anticoagulation due to DVT and recent PE and underlying malignancy. Therefore, he needs referral to an academic institution such as [facility] for a deep small bowel after endoscopic procedure that we do not have available here. They may be able to localize the AVM that was visualized on capsule study and ablated, so the patient will not have further bleeding. I think he needs to remain in the hospital at least until tomorrow on IV heparin so we can see if he has any recurrent overt bleeding. He does, then he is to be transferred to an academic institution as an inpatient. If he does not, then we may be able to make arrangements for him to have such a procedure as an outpatient this coming week and therefore be off oral anticoagulation for several days. I am not sure how the  internal medicine service will feel about the risk of this given the  bilateral DVTs. We will have further discussions and plan accordingly.  [name] Pager [phone] Mon-Fri 8a-5p [phone] after 5p, weekends, holidays

## 2016-03-15 NOTE — Progress Notes (Signed)
PROGRESS NOTE  Tim Ross:[id number] DOB: 1934/10/30 DOA: 03/09/2016 PCP: Garret Reddish, MD  Brief Summary:  Recent diagnosis of PE started on xarelto a week ago, recent diganosis of metastatic cancer a few days ago, sent [facility] due to blood loss anemia, prbc transfusion, [facility] /oncology consulted.    To have imaging guided lymph node biopsy on 10/25, colonoscopy on 10/27.   Lymph node biopsy positive for metastatic non small cell carcinoma.   Assessment/Plan: Active Problems:   DVT of lower extremity, bilateral (HCC)   Metastatic Non-small cell lung cancer    Acute gout of right foot   Anemia   GI bleed   Melena   Acute blood loss anemia   Pulmonary embolus (HCC)   Lung cancer (HCC)   Neoplasm related pain   Malnutrition of moderate degree  Acute blood loss anemia/symptomatic anemia:  like from gi bleed after started on anticoagulation a few weeks ago. plt wnl, INR wnl. Report noticed dark stool in the last few days, FOBT test positive. Also reports large black BM 10/24.  He denies abdominal pain, no n/v.  hgb 6.2 on admission, it was 12.3 a week ago, s/p two units prbc on 10/22, and prbcx1 on 10/23 , monitor hfb, transfuse to keep hgb >9 per hematology /oncology Dr Irene Limbo.   EGD10/24 unremarkable, [facility] did perform colonoscopy on 10/27.  Transfuse 1 unit [facility] 10/26.  Started IV heparin 10/27. Video capsule endoscopy 10/27.    HTN: hold home meds cozaar, in the setting of elevated cr and sinus tachycardia, will try low dose betablocker with holding parameters.  Bilateral DVT LEs / New diagnosis of PE, CTA from 10/13 showed 'Two or 3 small pulmonary emboli." no hypoxia, bp stable,  hold xarelto in the setting of acute blood loss anemia.   I spoke with GI after colonoscopy and they felt like it was safe to restart anticoagulation, would start heparin IV and monitor for bleeding, asked pharmacy to assist with heparin dosing. Monitor over weekend for  further GI bleeding.  Likely can resume DOAC if no further GI bleeding at discharge.   Metastatic cancer,  new diagnosis, lung vs prostate primary with bone mets,  PET scan on 10/18 showed lung mass, bone mets and possible prostate involvement. 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   psa 0.93  proceed with LN biopsy on 10/25.  May need bronchoscopy if node biopsy not sufficient. Last dose  xarelto on 10/22 Oncology Dr Irene Limbo input appreciated.   Right shoulder pain: with known right humeral head mets, pain control, may need rad onc involvement. May benefit from a sling prior to discharge  Moderate protein calorie malnutrition - see dietitian recommendations.  Acute gout attack: resolved, he was taken off HCTZ. He only drinks alcohol occasionally.  Solumedrol IV given and pain much better.  Leukocytosis - Pt was given dose of IV solumedrol on 10/26 for acute gout pain attack.   DVT prophylaxis: scds  Consultants:  [facility]  Oncology Dr Irene Limbo Intervention radiology for biopsy  Code Status: full   Family Communication:  Patient, his wife and daughter at bedside  Disposition Plan: watch for bleeding over weekend on heparin   Procedures:  EGD planned on 10/24, colonoscopy planned on 10/26  Imaging guided biopsy of supraclavicular or lower  cervical lymph node  On 10/25  prbc transfusion 10/23, 10/27  Colonoscopy 10/27  Video capsule endoscopy 10/27  Antibiotics:  none  Objective: BP (!) 124/59 (BP Location: Left Arm)   Pulse 91   Temp 98.1 F (36.7 C) (Oral)   Resp 18   Ht '6\' 1"'$  (1.854 m)   Wt 88.5 kg (195 lb)   SpO2 94%   BMI 25.73 kg/m   Intake/Output Summary (Last 24 hours) at 03/15/16 0801 Last data filed at 03/15/16 0750  Gross per 24 hour  Intake             843.6 ml  Output              300 ml  Net            543.6 ml   Filed Weights   03/09/16 1432 03/11/16 1326  Weight: 88.5 kg (195 lb) 88.5 kg (195 lb)    Exam:   General:  NAD, awake, alert, cooperative  Cardiovascular: RRR normal s1, s2 sounds  Respiratory: CTABL  Abdomen: Soft/ND/NT, positive BS  Musculoskeletal: No Edema, right shoulder limited range of motion due to pain  Neuro: aaox3  Data Reviewed: Basic Metabolic Panel:  Recent Labs Lab 03/09/16 1456 03/10/16 0759 03/11/16 0120 03/12/16 0404 03/14/16 0538 03/15/16 0527  NA 138 139 139 141 143 141  K 4.1 4.0 4.0 3.9 3.9 3.9  CL 109 113* 111 110 112* 112*  CO2 21* 21* '22 26 23 23  '$ GLUCOSE 158* 108* 119* 108* 127* 98  BUN 63* 56* 48* 33* 31* 34*  CREATININE 1.60* 1.33* 1.37* 1.38* 1.31* 1.44*  CALCIUM 8.6* 8.2* 8.2* 8.2* 8.4* 8.0*  MG 2.0  --   --   --   --   --    Liver Function Tests:  Recent Labs Lab 03/09/16 1456  AST 22  ALT 18  ALKPHOS 40  BILITOT 0.1*  PROT 6.0*  ALBUMIN 3.0*   No results for input(s): LIPASE, AMYLASE in the last 168 hours. No results for input(s): AMMONIA in the last 168 hours. CBC:  Recent Labs Lab 03/09/16 1456  03/10/16 0759  03/12/16 0404  03/13/16 0146 03/13/16 0854 03/13/16 2007 03/14/16 0538 03/15/16 0527  WBC 13.2*  --  13.7*  --  12.1*  --   --   --   --  14.1* 14.6*  NEUTROABS 10.2*  --   --   --   --   --   --   --   --  12.1*  --   HGB 6.2*  < > 7.7*  < > 7.9*  < > 7.9* 8.3* 9.3* 8.7* 8.7*  HCT 19.1*  < > 22.7*  --  24.1*  --   --   --  27.3* 25.7* 26.4*  MCV 99.0  --  91.9  --  93.8  --   --   --   --  91.1 92.3  PLT 413*  --  313  --  248  --   --   --   --  224 250  < > = values in this interval not displayed. Cardiac Enzymes:    Recent Labs Lab 03/09/16 1456  TROPONINI <0.03   BNP (last 3 results)  Recent Labs  03/09/16 1456  BNP 21.9    ProBNP (last 3 results)  Recent Labs  02/27/16 1031  PROBNP 37.0    CBG: No  results for input(s): GLUCAP in the last 168 hours.  Recent Results (from the past 240 hour(s))  Urine culture     Status: None   Collection Time: 03/09/16  5:54 PM  Result Value Ref Range Status   Specimen Description URINE, RANDOM  Final   Special Requests NONE  Final   Culture NO GROWTH Performed at [facility]   Final   Report Status 03/11/2016 FINAL  Final    Studies: No results found.  Scheduled Meds: . feeding supplement (ENSURE ENLIVE)  237 mL Oral BID BM  . mouth rinse  15 mL Mouth Rinse BID  . metoprolol tartrate  12.5 mg Oral BID  . nystatin  5 mL Oral QID  . pantoprazole  40 mg Oral BID  . senna-docusate  1 tablet Oral BID  . sodium chloride flush  3 mL Intravenous Q12H   Continuous Infusions: . heparin 1,400 Units/hr (03/15/16 0242)   Time spent: 20 mins  Irwin Brakeman MD [facility] Pager [phone]. If 7PM-7AM, please contact night-coverage at [email or web address], password [facility] 03/15/2016, 8:01 AM  LOS: 6 days

## 2016-03-16 ENCOUNTER — Telehealth: Payer: Self-pay | Admitting: Gastroenterology

## 2016-03-16 ENCOUNTER — Encounter (HOSPITAL_COMMUNITY): Payer: Self-pay | Admitting: Family Medicine

## 2016-03-16 LAB — CBC
HCT: 26.2 % — ABNORMAL LOW (ref 39.0–52.0)
Hemoglobin: 8.8 g/dL — ABNORMAL LOW (ref 13.0–17.0)
MCH: 29.9 pg (ref 26.0–34.0)
MCHC: 33.6 g/dL (ref 30.0–36.0)
MCV: 89.1 fL (ref 78.0–100.0)
PLATELETS: 259 10*3/uL (ref 150–400)
RBC: 2.94 MIL/uL — AB (ref 4.22–5.81)
RDW: 15.8 % — ABNORMAL HIGH (ref 11.5–15.5)
WBC: 10.7 10*3/uL — AB (ref 4.0–10.5)

## 2016-03-16 LAB — HEPARIN LEVEL (UNFRACTIONATED): HEPARIN UNFRACTIONATED: 0.36 [IU]/mL (ref 0.30–0.70)

## 2016-03-16 MED ORDER — ENOXAPARIN SODIUM 100 MG/ML ~~LOC~~ SOLN
1.0000 mg/kg | Freq: Once | SUBCUTANEOUS | Status: AC
  Administered 2016-03-16: 90 mg via SUBCUTANEOUS
  Filled 2016-03-16: qty 1

## 2016-03-16 MED ORDER — OXYCODONE HCL 5 MG PO TABS: 5.0000 mg | ORAL_TABLET | ORAL | 0 refills | Status: DC | PRN

## 2016-03-16 MED ORDER — ENSURE ENLIVE PO LIQD: 237.0000 mL | Freq: Two times a day (BID) | ORAL | 12 refills | Status: DC

## 2016-03-16 MED ORDER — ENOXAPARIN SODIUM 100 MG/ML ~~LOC~~ SOLN: 1.0000 mg/kg | Freq: Two times a day (BID) | SUBCUTANEOUS | Status: DC

## 2016-03-16 MED ORDER — METOPROLOL TARTRATE 25 MG PO TABS: 12.5000 mg | ORAL_TABLET | Freq: Two times a day (BID) | ORAL | 0 refills | Status: DC

## 2016-03-16 MED ORDER — OMEPRAZOLE 20 MG PO CPDR: 20.0000 mg | DELAYED_RELEASE_CAPSULE | Freq: Every day | ORAL | 0 refills | Status: DC

## 2016-03-16 MED ORDER — ENOXAPARIN (LOVENOX) PATIENT EDUCATION KIT
PACK | Freq: Once | Status: DC
  Filled 2016-03-16: qty 1

## 2016-03-16 MED ORDER — PANTOPRAZOLE SODIUM 20 MG PO TBEC: 20.0000 mg | DELAYED_RELEASE_TABLET | Freq: Every day | ORAL | Status: DC

## 2016-03-16 MED ORDER — ENOXAPARIN SODIUM 150 MG/ML ~~LOC~~ SOLN: 90.0000 mg | Freq: Two times a day (BID) | SUBCUTANEOUS | 0 refills | Status: DC

## 2016-03-16 MED ORDER — SENNOSIDES-DOCUSATE SODIUM 8.6-50 MG PO TABS: 1.0000 | ORAL_TABLET | Freq: Two times a day (BID) | ORAL | 0 refills | Status: DC

## 2016-03-16 NOTE — Care Management Note (Signed)
Case Management Note  Patient Details  Name: Tim Ross MRN: [id number] Date of Birth: [birth year]  Subjective/Objective:    Bilateral DVT, PE             Action/Plan: Discharge Planning: AVS reviewed:   NCM spoke to pt and wife at bedside. Pt independent prior to hospital stay. Contacted [facility] and cost for Lovenox $247.00 but they do not have in stock. Pt wife requested [facility] across the street. [facility]ed [facility] [place] # [phone] fax [phone]  and they do have in stock. Faxed Rx to [facility].   PCP-  Marin Olp MD    Expected Discharge Date:  03/16/2016              Expected Discharge Plan:  Home/Self Care  In-House Referral:  NA  Discharge planning Services  CM Consult, Medication Assistance  Post Acute Care Choice:  NA Choice offered to:  NA  DME Arranged:  N/A DME Agency:  NA  HH Arranged:  NA HH Agency:  NA  Status of Service:  Completed, signed off  If discussed at [place] of Stay Meetings, dates discussed:    Additional Comments:  Erenest Rasher, RN 03/16/2016, 2:33 PM

## 2016-03-16 NOTE — Progress Notes (Signed)
Reviewed discharge information with patient and caregiver. Answered all questions. Patient/caregiver able to teach back medications and reasons to contact MD/911. Patient verbalizes importance of PCP follow up appointment. Caregiver demonstrates ability to administer lovenox injections, able to teachback education on lovenox. Patient/caregiver able to teachback signs and symptoms of GI bleed.   Barbee Shropshire. Brigitte Pulse, RN

## 2016-03-16 NOTE — Discharge Instructions (Signed)
How and Where to Give Subcutaneous Enoxaparin Injections Enoxaparin is an injectable medicine. It is used to help prevent blood clots from developing in your veins. Health care providers often use anticoagulants like enoxaparin to prevent clots following surgery. Enoxaparin is also used in combination with other medicines to treat blood clots and heart attacks. If blood clots are left untreated, they can be life threatening.  Enoxaparin comes in single-use syringes. You inject enoxaparin through a syringe into your belly (abdomen). You should change the injection site each time you give yourself a shot. Continue the enoxaparin injections as directed by your health care provider. Your health care provider will use blood clotting test results to decide when you can safely stop using enoxaparin injections. If your health care provider prescribes any additional medicines, use the medicines exactly as directed. HOW DO I INJECT ENOXAPARIN?  1. Wash your hands with soap and water. 2. Clean the selected injection site as directed by your health care provider. 3. Remove the needle cap by pulling it straight off the syringe. 4. Hold the syringe like a pencil using your writing hand. 5. Use your other hand to pinch and hold an inch of the cleansed skin. 6. Insert the entire needle straight down into the fold of skin. 7. Push the plunger with your thumb until the syringe is empty. 8. Pull the needle straight out of your skin. 9. Enoxaparin injection prefilled syringes and graduated prefilled syringes are available with a system that shields the needle after injection. After you have completed your injection and removed the needle from your skin, firmly push down on the plunger. The protective sleeve will automatically cover the needle and you will hear a click. The click means the needle is safely covered. 10. Place the syringe in the nearest needle box, also called a sharps container. If you do not have a sharps  container, you can use a hard-sided plastic container with a secure lid, such as an empty laundry detergent bottle. WHAT ELSE DO I NEED TO KNOW?  Do not use enoxaparin if:  You have allergies to heparin or pork products.  You have been diagnosed with a condition called thrombocytopenia.  Do not use the syringe or needle more than one time.  Use medicines only as directed by your health care provider.  Changes in medicines, supplements, diet, and illness can affect your anticoagulation therapy. Be sure to inform your health care provider of any of these changes.  It is important that you tell all of your health care providers and your dentist that you are taking an anticoagulant, especially if you are injured or plan to have any type of procedure.  While on anticoagulants, you will need to have blood tests done routinely as directed by your health care provider.  While using this medicine, avoid physical activities or sports that could result in a fall or cause injury.  Follow up with your laboratory test and health care provider appointments as directed. It is very important to keep your appointments. Not keeping appointments could result in a chronic or permanent injury, pain, or disability.  Before giving your medicine, you should make sure the injection is a clear and colorless or pale yellow solution. If your medicine becomes discolored or if there are particles in the syringe, do not use it and notify your health care provider.  Keep your medicine safely stored at room temperature. SEEK MEDICAL CARE IF:  You develop any rashes on your skin.  You have large  areas of bruising on your skin.  You have any worsening of the condition for which you take Enoxaparin.  You develop a fever. SEEK IMMEDIATE MEDICAL CARE IF:  You develop bleeding problems such as:  Bleeding from the gums or nose that does not stop quickly.  Vomiting blood or coughing up blood.  Blood in your  urine.  Blood in your stool, or stool that has a dark, tarry, or coffee grounds appearance.  A cut that does not stop bleeding within 10 minutes. These symptoms may represent a serious problem that is an emergency. Do not wait to see if the symptoms will go away. Get medical help right away. Call your local emergency services (911 in the [place] Do not drive yourself to the hospital.    This information is not intended to replace advice given to you by your health care provider. Make sure you discuss any questions you have with your health care provider.   Document Released: 03/06/2004 Document Revised: 05/26/2014 Document Reviewed: 10/20/2013 Elsevier Interactive Patient Education 2016 Elsevier Inc.   Anemia, Nonspecific Anemia is a condition in which the concentration of red blood cells or hemoglobin in the blood is below normal. Hemoglobin is a substance in red blood cells that carries oxygen to the tissues of the body. Anemia results in not enough oxygen reaching these tissues.  CAUSES  Common causes of anemia include:   Excessive bleeding. Bleeding may be internal or external. This includes excessive bleeding from periods (in women) or from the intestine.   Poor nutrition.   Chronic kidney, thyroid, and liver disease.  Bone marrow disorders that decrease red blood cell production.  Cancer and treatments for cancer.  HIV, AIDS, and their treatments.  Spleen problems that increase red blood cell destruction.  Blood disorders.  Excess destruction of red blood cells due to infection, medicines, and autoimmune disorders. SIGNS AND SYMPTOMS   Minor weakness.   Dizziness.   Headache.  Palpitations.   Shortness of breath, especially with exercise.   Paleness.  Cold sensitivity.  Indigestion.  Nausea.  Difficulty sleeping.  Difficulty concentrating. Symptoms may occur suddenly or they may develop slowly.  DIAGNOSIS  Additional blood tests are often  needed. These help your health care provider determine the best treatment. Your health care provider will check your stool for blood and look for other causes of blood loss.  TREATMENT  Treatment varies depending on the cause of the anemia. Treatment can include:   Supplements of iron, vitamin W29, or folic acid.   Hormone medicines.   A blood transfusion. This may be needed if blood loss is severe.   Hospitalization. This may be needed if there is significant continual blood loss.   Dietary changes.  Spleen removal. HOME CARE INSTRUCTIONS Keep all follow-up appointments. It often takes many weeks to correct anemia, and having your health care provider check on your condition and your response to treatment is very important. SEEK IMMEDIATE MEDICAL CARE IF:   You develop extreme weakness, shortness of breath, or chest pain.   You become dizzy or have trouble concentrating.  You develop heavy vaginal bleeding.   You develop a rash.   You have bloody or black, tarry stools.   You faint.   You vomit up blood.   You vomit repeatedly.   You have abdominal pain.  You have a fever or persistent symptoms for more than 2-3 days.   You have a fever and your symptoms suddenly get worse.   You  are dehydrated.  MAKE SURE YOU:  Understand these instructions.  Will watch your condition.  Will get help right away if you are not doing well or get worse.   This information is not intended to replace advice given to you by your health care provider. Make sure you discuss any questions you have with your health care provider.   Document Released: 06/12/2004 Document Revised: 01/05/2013 Document Reviewed: 10/29/2012 Elsevier Interactive Patient Education 2016 Elsevier Inc.  Chronic Pain Chronic pain can be defined as pain that is off and on and lasts for 3-6 months or longer. Many things cause chronic pain, which can make it difficult to make a diagnosis. There are  many treatment options available for chronic pain. However, finding a treatment that works well for you may require trying various approaches until the right one is found. Many people benefit from a combination of two or more types of treatment to control their pain. SYMPTOMS  Chronic pain can occur anywhere in the body and can range from mild to very severe. Some types of chronic pain include:  Headache.  Low back pain.  Cancer pain.  Arthritis pain.  Neurogenic pain. This is pain resulting from damage to nerves. People with chronic pain may also have other symptoms such as:  Depression.  Anger.  Insomnia.  Anxiety. DIAGNOSIS  Your health care provider will help diagnose your condition over time. In many cases, the initial focus will be on excluding possible conditions that could be causing the pain. Depending on your symptoms, your health care provider may order tests to diagnose your condition. Some of these tests may include:   Blood tests.   CT scan.   MRI.   X-rays.   Ultrasounds.   Nerve conduction studies.  You may need to see a specialist.  TREATMENT  Finding treatment that works well may take time. You may be referred to a pain specialist. He or she may prescribe medicine or therapies, such as:   Mindful meditation or yoga.  Shots (injections) of numbing or pain-relieving medicines into the spine or area of pain.  Local electrical stimulation.  Acupuncture.   Massage therapy.   Aroma, color, light, or sound therapy.   Biofeedback.   Working with a physical therapist to keep from getting stiff.   Regular, gentle exercise.   Cognitive or behavioral therapy.   Group support.  Sometimes, surgery may be recommended.  HOME CARE INSTRUCTIONS   Take all medicines as directed by your health care provider.   Lessen stress in your life by relaxing and doing things such as listening to calming music.   Exercise or be active as  directed by your health care provider.   Eat a healthy diet and include things such as vegetables, fruits, fish, and lean meats in your diet.   Keep all follow-up appointments with your health care provider.   Attend a support group with others suffering from chronic pain. SEEK MEDICAL CARE IF:   Your pain gets worse.   You develop a new pain that was not there before.   You cannot tolerate medicines given to you by your health care provider.   You have new symptoms since your last visit with your health care provider.  SEEK IMMEDIATE MEDICAL CARE IF:   You feel weak.   You have decreased sensation or numbness.   You lose control of bowel or bladder function.   Your pain suddenly gets much worse.   You develop shaking.  You  develop chills.  You develop confusion.  You develop chest pain.  You develop shortness of breath.  MAKE SURE YOU:  Understand these instructions.  Will watch your condition.  Will get help right away if you are not doing well or get worse.   This information is not intended to replace advice given to you by your health care provider. Make sure you discuss any questions you have with your health care provider.   Document Released: 01/25/2002 Document Revised: 01/05/2013 Document Reviewed: 10/29/2012 Elsevier Interactive Patient Education 2016 Elsevier Inc.   Gastrointestinal Bleeding Gastrointestinal bleeding is bleeding somewhere along the path that food travels through the body (digestive tract). This path is anywhere between the mouth and the opening of the butt (anus). You may have blood in your throw up (vomit) or in your poop (stools). If there is a lot of bleeding, you may need to stay in the hospital. Wall  Only take medicine as told by your doctor.  Eat foods with fiber such as whole grains, fruits, and vegetables. You can also try eating 1 to 3 prunes a day.  Drink enough fluids to keep your pee (urine) clear or  pale yellow. GET HELP RIGHT AWAY IF:   Your bleeding gets worse.  You feel dizzy, weak, or you pass out (faint).  You have bad cramps in your back or belly (abdomen).  You have large blood clumps (clots) in your poop.  Your problems are getting worse. MAKE SURE YOU:   Understand these instructions.  Will watch your condition.  Will get help right away if you are not doing well or get worse.   This information is not intended to replace advice given to you by your health care provider. Make sure you discuss any questions you have with your health care provider.   Document Released: 02/12/2008 Document Revised: 04/21/2012 Document Reviewed: 10/23/2014 Elsevier Interactive Patient Education [employer].

## 2016-03-16 NOTE — Discharge Summary (Signed)
Physician Discharge Summary  Dray Dente Handy RCV:[id number] DOB: 12-05-1934 DOA: 03/09/2016  PCP: Garret Reddish, MD  Admit date: 03/09/2016 Discharge date: 03/16/2016  Admitted From: Home  Disposition:  Home   Recommendations for Outpatient Follow-up:  1. Follow up with Dr. Irene Limbo later this week as scheduled 2. Follow up with Gastroenterology for referral to tertiary care center as arranged by them 3. Please obtain BMP/CBC later this week  Discharge Condition: STABLE CODE STATUS: FULL  Brief/Interim Summary:  Tim Ross is a 81 y.o. male H/o smoking quit 87yr ago, h/o htn, diverticulosis, recent diagnosis of PE and metastatic cancer with lung vs prostate primary mets to bone , he is started on xarelto two weeks ago, he presented [facility] due to above complain, found to have acute anemia, hgb 6.2, wbc 13.2 , bun 63, cr .16, he has sinus tachycardia, heart rate around 110's bp stable, he does not have hypoxia at rest, stool occult +, [facility]  Patient Reports noticed dark stool in the last few days, He denies abdominal pain, no n/v. He denies any other source of blood loss. No fever. Recent diagnosis of PE started on xarelto a week ago, recent diganosis of metastatic cancer a few days ago, sent from [facility] due to blood loss anemia, prbc transfusion, [facility] /oncology consulted.    Pt had imaging guided lymph node biopsy on 10/25 positive for metastatic non small cell disease, had colonoscopy on 10/27.   Lymph node biopsy positive for metastatic non small cell carcinoma.   Acute blood loss anemia/symptomatic anemia:  like from gi bleedafter started on anticoagulation a few weeks ago. plt wnl, INR wnl. Report noticed dark stool in the last few days, FOBT test positive. Also reports large black BM 10/24.  He denies abdominal pain, no n/v. hgb 6.2 on admission,it was 12.3 a week ago, s/p two unitsprbc  on 10/22, and prbcx1 on 10/23 , monitor hfb, transfuse to keep hgb >9 per hematology /oncology Dr KIrene Limbo  EGD10/24 unremarkable, [facility] did perform colonoscopy on 10/27 - see report.  [facility]  Started IV heparin 10/27, no further bleeding, Hg remained stable. Video capsule endoscopy 10/27 - see report.  GI to arrange for patient to go to tertiary care center for specialized endoscopy procedure.  Pt discharged home on lovenox injections. GI to contact patient with information about further procedure.    HZWC:HENIhome meds cozaar, in the setting of elevated cr, blood pressure well controlled with low dose beta blocker.   Bilateral DVT LEs / New diagnosis of PE, CTA from 10/13 showed 'Two or 3 small pulmonary emboli." no hypoxia, bp stable,  hold xarelto in the setting of acute blood loss anemia.   I spoke with GI after colonoscopy and they felt like it was safe to restart anticoagulation, would start heparin IV and monitor for bleeding, asked pharmacy to assist with heparin dosing. Monitor over weekend for further GI bleeding.  Pt discharged home on lovenox injections BID.  Follow up with Dr. KIrene Limbolater this week.   Metastatic cancer,  new diagnosis, lung vs prostate primary with bone mets,  PET scan on 10/18 showed lung mass, bone mets and possible prostate involvement. Also showed "Asymmetric extra-axial hypodensity along the right frontal lobe, possibly a chronic subdural hematoma or cystic hygroma," MRI brain w/wo contrast negative for brain mets, The right frontal finding on previous PET-CT is a  small hygroma.   psa 0.93  proceed with LN biopsy on 10/25. May need bronchoscopy if node biopsy not sufficient. Last dose  xarelto on 10/22 Oncology Dr Irene Limbo input appreciated.  Pt to follow up with Dr. Irene Limbo later this week in office.  Pt has metastasis to right shoulder with pain, sending home on pain medication and stool softener.    Right shoulder pain: with known right humeral  head mets, pain control, may need rad onc involvement. May benefit from a sling prior to discharge.  Sending home on pain medication.    Moderate protein calorie malnutrition - see dietitian recommendations.  Acute gout attack: resolved, he was taken off HCTZ. He only drinks alcohol occasionally.  Solumedrol IV given and pain much better.  Leukocytosis - Pt was given dose of IV solumedrol on 10/26 for acute gout pain attack.   Consultants: [facility]  Oncology Dr Irene Limbo Intervention radiology for biopsy  Code Status:full   Family Communication:Patient, his wife and daughter at bedside  Disposition Plan: watch for bleeding over weekend on heparin   Procedures:  EGD planned on 10/24, colonoscopy planned on 10/26  Imaging guided biopsy of supraclavicular or lower cervical lymph node  On 10/25  prbc transfusion 10/23, 10/27  Colonoscopy 10/27  Video capsule endoscopy 10/27  Discharge Diagnoses:  Active Problems:   DVT of lower extremity, bilateral (HCC)   Metastatic Non-small cell lung cancer    Acute gout of right foot   Anemia   GI bleed   Melena   Acute blood loss anemia   Pulmonary embolus (HCC)   Lung cancer (HCC)   Neoplasm related pain   Malnutrition of moderate degree    Discharge Instructions     Medication List    STOP taking these medications   losartan 25 MG tablet Commonly known as:  COZAAR   Rivaroxaban 15 MG Tabs tablet Commonly known as:  XARELTO   rivaroxaban 20 MG Tabs tablet Commonly known as:  XARELTO     TAKE these medications   CINNAMON PO Take 2 tablets by mouth every morning.   enoxaparin 150 MG/ML injection Commonly known as:  LOVENOX Inject 0.6 mLs (90 mg total) into the skin every 12 (twelve) hours. Start taking on:  03/17/2016   feeding supplement (ENSURE ENLIVE) Liqd Take 237 mLs by mouth 2 (two) times daily between meals.   FISH OIL PO Take 1 capsule by mouth every morning.   ICAPS Caps Take 1 capsule  by mouth 2 (two) times daily.   metoprolol tartrate 25 MG tablet Commonly known as:  LOPRESSOR Take 0.5 tablets (12.5 mg total) by mouth 2 (two) times daily.   multivitamin with minerals Tabs tablet Take 1 tablet by mouth every morning.   omeprazole 20 MG capsule Commonly known as:  PRILOSEC Take 1 capsule (20 mg total) by mouth daily. What changed:  when to take this   oxyCODONE 5 MG immediate release tablet Commonly known as:  ROXICODONE Take 1 tablet (5 mg total) by mouth every 4 (four) hours as needed for severe pain.   senna-docusate 8.6-50 MG tablet Commonly known as:  Senokot-S Take 1 tablet by mouth 2 (two) times daily.      Follow-up Information    Sullivan Lone, MD .   Specialties:  Hematology, Oncology Why:  They will call you with the appointment Contact information: [address] [place]  [address] [phone]          Allergies  Allergen Reactions  .  Celebrex [Celecoxib] Hives   Procedures/Studies: Dg Chest 2 View  Result Date: 03/09/2016 CLINICAL DATA:  Shortness of Breath, lung cancer EXAM: CHEST  2 VIEW COMPARISON:  03/05/2016 FINDINGS: Cardiomediastinal silhouette is stable. Persistent left hilar prominence. Persistent left perihilar interstitial prominence probable interstitial tumor spread or pneumonitis. Peripheral consolidation in left upper lobe is stable. Mild hyperinflation. Right lung is clear. Stable sclerotic lesion in right fourth rib. IMPRESSION: Persistent left hilar prominence. Persistent left perihilar interstitial prominence probable interstitial tumor spread or pneumonitis. Peripheral consolidation in left upper lobe is stable. Mild hyperinflation. Right lung is clear. Electronically Signed   By: Lahoma Crocker M.D.   On: 03/09/2016 15:21   Dg Chest 2 View  Result Date: 02/27/2016 CLINICAL DATA:  Short of breath on exertion. Symptoms for 2 months intermittently. History of hypertension. EXAM: CHEST  2 VIEW COMPARISON:  10/17/2014  FINDINGS: There is left sided irregular interstitial thickening with a focal area of opacity which projects in the left upper lobe lateral to the left hilum. This is new since the prior exam. Right lung is essentially clear and unchanged from the prior study. Lungs are hyperexpanded with flattened hemidiaphragms consistent with COPD. Minimal left pleural effusion is suggested. No pneumothorax. Cardiac silhouette is normal in size. No mediastinal or hilar masses or convincing adenopathy. Skeletal structures are demineralized but intact. IMPRESSION: 1. Irregularly thickened interstitial markings on the left with an ill-defined focal opacity noted in the left upper lobe, new from the prior study. No acute findings in the right lung. Findings may reflect asymmetric interstitial edema or interstitial infection. Neoplastic disease is not excluded given the focal opacity in the left upper lobe. Consider follow-up chest CT with contrast. 2. Underlying COPD.  No acute findings in the right lung. Electronically Signed   By: Lajean Manes M.D.   On: 02/27/2016 13:32   Ct Angio Chest Pe W Or Wo Contrast  Addendum Date: 02/29/2016   ADDENDUM REPORT: 02/29/2016 21:09 ADDENDUM: I spoke to Dr. Elease Hashimoto. Electronically Signed   By: Dorise Bullion III M.D   On: 02/29/2016 21:09   Addendum Date: 02/29/2016   ADDENDUM REPORT: 02/29/2016 21:06 ADDENDUM: Findings called to the patient's physician. Electronically Signed   By: Dorise Bullion III M.D   On: 02/29/2016 21:06   Result Date: 02/29/2016 CLINICAL DATA:  Shortness of breath. Elevated D-dimer. No history of cancer. EXAM: CT ANGIOGRAPHY CHEST WITH CONTRAST TECHNIQUE: Multidetector CT imaging of the chest was performed using the standard protocol during bolus administration of intravenous contrast. Multiplanar CT image reconstructions and MIPs were obtained to evaluate the vascular anatomy. CONTRAST:  64 mL of Isovue 370 COMPARISON:  CT scan Oct 17, 2014 and chest x-ray  February 27, 2016 FINDINGS: Cardiovascular: The thoracic aorta is normal in caliber. No dissection. There is new adenopathy in the mediastinum, particularly to the left. An AP window node on image 46 measures 2.3 by 3.3 cm. There are also some abnormal lymph nodes, larger in the interval, to the right of the trachea. The right hilum is spared but there is bulky adenopathy in the left hilum. No adenopathy in the bilateral axilla. There is a borderline node, larger in the interval, in the base of the neck on the left. The timing of contrast was mildly limited limiting evaluation of the pulmonary arteries beyond the segmental branch level. Within this limitation, there appear to be a few small pulmonary emboli. The best example is in the right upper lobe on series 9, image 97.  I suspect another in the right lower lobe on image 154. The left-sided pulmonary arteries are surrounded by adenopathy. Very tiny embolus may be seen in the left lower lobe on axial image 142 no other emboli definitively identified. Mediastinum/Nodes: Mediastinal and left hilar adenopathy as above. The trachea and mainstem bronchi are normal. There is a small left effusion. No right effusion. There is a small anterior pericardial effusion. Lungs/Pleura: Scarring is seen in the medial right apex with a small calcified granuloma in the right apex. A few scattered tiny nodules on the right are stable and too small to characterize. No definitive new nodules on the right. The nodule in the right upper lobe on the previous study has resolved. There is a peripheral opacity in the left lung measuring 3.8 x 2.2 cm on image 29. Interlobular septal thickening and ground-glass opacity surrounds this abnormality. There is also extension towards the hila as seen on image 31. There is a nodule in the lingula on image 40 which is new measuring 7 mm. A few other scattered nodules are seen in the lingula, also new. There is a small anterior pericardial effusion  Upper Abdomen: There is a stable left adrenal nodule which is heterogeneous measuring 3.4 by 3.2 cm today versus 3.2 by 3.2 cm previously, not significantly changed. The attenuation is up to 15 Hounsfield units. The right adrenal gland is normal. Renal cysts are noted. No other abnormalities in the upper abdomen. Musculoskeletal: Suggested lytic lesion in thea right humeral head. No other evidence of bony metastatic disease. Review of the MIP images confirms the above findings. IMPRESSION: 1. Two or 3 small pulmonary emboli. 2. There is a peripheral opacity in the left upper lobe with surrounding interlobular septal thickening and ground-glass opacity in addition to several scattered nodules throughout the left upper lobe. These findings are new. While an infectious etiology is possible, the findings are highly suggestive of malignancy with satellite nodules and lymphangitic spread of tumor. 3. New adenopathy in the mediastinum and left hilum. 4. Suspected lytic lesion in the right humeral head. 5. Left adrenal nodule. While the stability in size is reassuring, the left adrenal nodule is indeterminate on this study. Findings being called to referring physician. Electronically Signed: By: Dorise Bullion III M.D On: 02/29/2016 16:14   Mr Brain W Wo Contrast  Result Date: 03/10/2016 CLINICAL DATA:  Metastatic disease.  Brain staging. EXAM: MRI HEAD WITHOUT AND WITH CONTRAST TECHNIQUE: Multiplanar, multiecho pulse sequences of the brain and surrounding structures were obtained without and with intravenous contrast. CONTRAST:  44m MULTIHANCE GADOBENATE DIMEGLUMINE 529 MG/ML IV SOLN COMPARISON:  None. FINDINGS: Brain: No abnormal intracranial enhancement to suggest metastatic disease. The right frontal extra-axial low-density appearance on previous PET-CT reflects a small hygroma without significant mass effect. Small hygroma also present around the right cerebellum. Age congruent brain volume and white matter  appearance. No hydrocephalus. Vascular: Normal flow voids. Skull and upper cervical spine: No evidence of metastasis Sinuses/Orbits: Bilateral cataract resection. IMPRESSION: Negative for intracranial metastasis. The right frontal finding on previous PET-CT is a small hygroma. Electronically Signed   By: JMonte FantasiaM.D.   On: 03/10/2016 11:15   Nm Pet Image Initial (pi) Skull Base To Thigh  Result Date: 03/05/2016 CLINICAL DATA:  Initial treatment strategy for lung nodule. EXAM: NUCLEAR MEDICINE PET SKULL BASE TO THIGH TECHNIQUE: 12.3 mCi F-18 FDG was injected intravenously. Full-ring PET imaging was performed from the skull base to thigh after the radiotracer. CT data was obtained  and used for attenuation correction and anatomic localization. COMPARISON:  Multiple exams, including 02/29/2016 FINDINGS: NECK Please note that inadvertently the entire head was included. There is asymmetric hypodensity along the right frontal subdural space which could be from subdural hygroma or chronic subdural hematoma, MRI of the brain with and without contrast should be considered for further assessment. I do not see a large mass in the brain on the PET images. There is pathologic supraclavicular adenopathy with a 9 mm left supraclavicular node on image 78 78/4 having a maximum standard uptake value of 10.1 and a cluster of small right supraclavicular nodes having a maximum standard uptake value of 8.5 CHEST There is pathologic upper paratracheal, lower paratracheal, AP window, prevascular, and subcarinal hypermetabolic adenopathy in addition to left hilar and infrahilar adenopathy in the chest. Index AP window lymph node 1.5 cm in short axis with maximum standard uptake value 25.5. Subcarinal lymph node 2.0 cm in short axis with maximum standard uptake value 19.0. There is patchy airspace opacity with nodularity in the left upper lobe which has significantly hypermetabolic components, maximum standard uptake value 24.0  faintly hypermetabolic activity in the left lower lobe, from inflammation or possibly lymphangitic spread. Trace left pleural effusion, nonspecific for malignancy. Severe emphysema. Coronary, aortic arch, and branch vessel atherosclerotic vascular disease. ABDOMEN/PELVIS The left adrenal gland primarily has low density characteristics typical for adenoma, with central density measurement of 1 Hounsfield unit. However, there is marginal high activity along the left side of the adrenal gland with maximum SUV up to 8.7, raise the possibility of a collision lesion with early metastatic disease to the left adrenal gland. Within the prostate gland, there is a central focus of high activity with maximum SUV of 19.15. All this could possibly be from prostatic urethral urine activity rather than tumor, there is also a left anterior prostate base focus of accentuated activity with maximum and standard uptake value of 17.7. Appearance concerning for prostate malignancy. Suspected left scrotal hydrocele. Bilateral renal cysts. Sigmoid colon diverticulosis. SKELETON 5 cm metastatic lesion in the right proximal humerus mainly involving the humeral head, maximum SUV 49.6. Lucent metastatic lesion of the left T10 vertebra, maximum SUV 36.7. IMPRESSION: 1. Primarily left upper lobe infiltrative hypermetabolic mass with extensive mediastinal and left hilar hypermetabolic adenopathy, osseous metastatic lesions to the right humeral head and left T10 vertebra, and possible collision metastatic lesion of the left adrenal gland with an adrenal adenoma. 2. Central and left anterior base hypermetabolic activity in the prostate gland, concerning for prostate cancer. 3. Asymmetric extra-axial hypodensity along the right frontal lobe, possibly a chronic subdural hematoma or cystic hygroma, under the circumstances I recommend MRI of the brain with and without contrast for further workup and to assess for intracranial metastatic disease. Other  imaging findings of potential clinical significance: Severe emphysema. Coronary, aortic arch, and branch vessel atherosclerotic vascular disease. Aortoiliac atherosclerotic vascular disease. Bilateral renal cysts. Sigmoid colon diverticulosis. Left scrotal hydrocele. Electronically Signed   By: Van Clines M.D.   On: 03/05/2016 15:34   US Biopsy  Result Date: 03/12/2016 INDICATION: 81 year old with an infiltrative left upper lobe lesion. Concern for metastatic lung cancer and tissue diagnosis is needed. There are hypermetabolic supraclavicular lymph nodes. EXAM: ULTRASOUND-GUIDED CORE BIOPSIES AND FINE-NEEDLE ASPIRATIONS OF RIGHT SUPRACLAVICULAR LYMPH NODE MEDICATIONS: None. ANESTHESIA/SEDATION: Moderate (conscious) sedation was employed during this procedure. A total of Versed 1.0 mg and Fentanyl 50 mcg was administered intravenously. Moderate Sedation Time: 21 minutes. The patient's level of consciousness and  vital signs were monitored continuously by radiology nursing throughout the procedure under my direct supervision. FLUOROSCOPY TIME:  None COMPLICATIONS: None immediate. PROCEDURE: Informed written consent was obtained from the patient after a thorough discussion of the procedural risks, benefits and alternatives. All questions were addressed. A timeout was performed prior to the initiation of the procedure. Both sides of the neck evaluated with ultrasound. Small bilateral supraclavicular lymph nodes were identified. Right supraclavicular lymph node adjacent to the right internal jugular vein was felt to be most amendable to biopsy. Right side of the neck was prepped with chlorhexidine. Skin was anesthetized with 1% lidocaine. A total of 6 ultrasound-guided fine-needle aspirations were obtained within this lymph node with 25 gauge needles. Following the fine-needle aspirations, 3 core biopsies were obtained with a 20 gauge core device using ultrasound guidance. Specimens placed in formalin.  Bandage placed over the puncture site. FINDINGS: Small bilateral supraclavicular lymph nodes. A round hypoechoic lymph node just lateral to the right internal jugular vein was sampled. This node measures roughly 1 cm in the short axis. Biopsy needles confirmed within the lesion. IMPRESSION: Ultrasound-guided core biopsies and fine-needle aspirations of right supraclavicular lymph node. Electronically Signed   By: Markus Daft M.D.   On: 03/12/2016 13:15     Subjective: No further bleeding.  Has been on heparin drip for couple of days. Wants to go home. Says he is comfortable giving lovenox at home.    Discharge Exam: Vitals:   03/15/16 2149 03/16/16 0521  BP: (!) 152/74 135/67  Pulse: (!) 108 84  Resp: (!) 22 18  Temp: 98.3 F (36.8 C) 99.1 F (37.3 C)   Vitals:   03/15/16 0908 03/15/16 1453 03/15/16 2149 03/16/16 0521  BP:  134/71 (!) 152/74 135/67  Pulse:  (!) 107 (!) 108 84  Resp:  16 (!) 22 18  Temp:  98.7 F (37.1 C) 98.3 F (36.8 C) 99.1 F (37.3 C)  TempSrc:  Oral Oral Oral  SpO2: 96% 92% 92% 91%  Weight:      Height:       General: Pt is alert, awake, not in acute distress Cardiovascular: RRR, S1/S2 +, no rubs, no gallops Respiratory: CTA bilaterally, no wheezing, no rhonchi Abdominal: Soft, NT, ND, bowel sounds + Extremities: right shoulder limited ROM due to pain, no edema, no cyanosis  The results of significant diagnostics from this hospitalization (including imaging, microbiology, ancillary and laboratory) are listed below for reference.     Microbiology: Recent Results (from the past 240 hour(s))  Urine culture     Status: None   Collection Time: 03/09/16  5:54 PM  Result Value Ref Range Status   Specimen Description URINE, RANDOM  Final   Special Requests NONE  Final   Culture NO GROWTH Performed at [facility]   Final   Report Status 03/11/2016 FINAL  Final     Labs: BNP (last 3 results)  Recent Labs  03/09/16 1456  BNP 16.0   Basic  Metabolic Panel:  Recent Labs Lab 03/09/16 1456 03/10/16 0759 03/11/16 0120 03/12/16 0404 03/14/16 0538 03/15/16 0527  NA 138 139 139 141 143 141  K 4.1 4.0 4.0 3.9 3.9 3.9  CL 109 113* 111 110 112* 112*  CO2 21* 21* '22 26 23 23  '$ GLUCOSE 158* 108* 119* 108* 127* 98  BUN 63* 56* 48* 33* 31* 34*  CREATININE 1.60* 1.33* 1.37* 1.38* 1.31* 1.44*  CALCIUM 8.6* 8.2* 8.2* 8.2* 8.4* 8.0*  MG 2.0  --   --   --   --   --  Liver Function Tests:  Recent Labs Lab 03/09/16 1456  AST 22  ALT 18  ALKPHOS 40  BILITOT 0.1*  PROT 6.0*  ALBUMIN 3.0*   No results for input(s): LIPASE, AMYLASE in the last 168 hours. No results for input(s): AMMONIA in the last 168 hours. CBC:  Recent Labs Lab 03/09/16 1456  03/10/16 0759  03/12/16 0404  03/13/16 0854 03/13/16 2007 03/14/16 0538 03/15/16 0527 03/16/16 0445  WBC 13.2*  --  13.7*  --  12.1*  --   --   --  14.1* 14.6* 10.7*  NEUTROABS 10.2*  --   --   --   --   --   --   --  12.1*  --   --   HGB 6.2*  < > 7.7*  < > 7.9*  < > 8.3* 9.3* 8.7* 8.7* 8.8*  HCT 19.1*  < > 22.7*  --  24.1*  --   --  27.3* 25.7* 26.4* 26.2*  MCV 99.0  --  91.9  --  93.8  --   --   --  91.1 92.3 89.1  PLT 413*  --  313  --  248  --   --   --  224 250 259  < > = values in this interval not displayed. Cardiac Enzymes:  Recent Labs Lab 03/09/16 1456  TROPONINI <0.03   BNP: Invalid input(s): POCBNP CBG: No results for input(s): GLUCAP in the last 168 hours. D-Dimer No results for input(s): DDIMER in the last 72 hours. Hgb A1c No results for input(s): HGBA1C in the last 72 hours. Lipid Profile No results for input(s): CHOL, HDL, LDLCALC, TRIG, CHOLHDL, LDLDIRECT in the last 72 hours. Thyroid function studies No results for input(s): TSH, T4TOTAL, T3FREE, THYROIDAB in the last 72 hours.  Invalid input(s): FREET3 Anemia work up No results for input(s): VITAMINB12, FOLATE, FERRITIN, TIBC, IRON, RETICCTPCT in the last 72 hours. Urinalysis     Component Value Date/Time   COLORURINE YELLOW 03/09/2016 [place] 03/09/2016 1754   LABSPEC 1.023 03/09/2016 1754   PHURINE 5.5 03/09/2016 1754   GLUCOSEU NEGATIVE 03/09/2016 1754   HGBUR NEGATIVE 03/09/2016 1754   BILIRUBINUR NEGATIVE 03/09/2016 1754   BILIRUBINUR n 06/07/2014 0929   KETONESUR NEGATIVE 03/09/2016 1754   PROTEINUR NEGATIVE 03/09/2016 1754   UROBILINOGEN 0.2 06/07/2014 0929   NITRITE NEGATIVE 03/09/2016 1754   LEUKOCYTESUR NEGATIVE 03/09/2016 1754   Sepsis Labs Invalid input(s): PROCALCITONIN,  WBC,  LACTICIDVEN Microbiology Recent Results (from the past 240 hour(s))  Urine culture     Status: None   Collection Time: 03/09/16  5:54 PM  Result Value Ref Range Status   Specimen Description URINE, RANDOM  Final   Special Requests NONE  Final   Culture NO GROWTH Performed at [facility] [facility]   Final   Report Status 03/11/2016 FINAL  Final   Time coordinating discharge: 32 mins  SIGNED:  Irwin Brakeman, MD  [facility] 03/16/2016, 2:16 PM Pager   If 7PM-7AM, please contact night-coverage [email or web address] Password [email or web address]

## 2016-03-16 NOTE — Telephone Encounter (Signed)
Gregary Signs,    Please work on this Monday morning.  I will be off of work Monday post-call from hospital week and weekend. I will be back Tuesday.  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  He was in [facility] for the last week and leaving today (Sunday).  He is on anticoagulation (most likely home on Lovenox) because he has a new Dx of metastatic lung cancer and DVT/PE from that.  So he must be on anticoagulation except for when it is stopped for the procedure.   The procedure needs to be done this week.  We might get help from Dr Luciana Axe, the advanced endoscopy GI fellow at [facility].  His number is [phone]  If [facility] cannot do it this week, please try for [place] and request a Double Balloon Enteroscopy this week.  The patient has not passed any visible blood in a week, and his Hgb has remained stable at 8.8 over that time (even after the last 2 days on IV heparin).  He also has a copy of his small bowel video capsule study report , showing an AVM in the proximal ileum.  He can bring this to the doctors that do his procedure.  Please get him an appt for this procedure and then communicate plans to his wife.  Please confirm which anticoagulant he is on, because those plans were still uncertain Sunday afternoon.  If Lovenox, then he would just not take a dose the AM of procedure.  If he had to go back on Eliquis, the scope would need to be later in the week so he can stop the med 2 days prior to scope.  Thank you.  - Wilfrid Lund

## 2016-03-16 NOTE — Progress Notes (Addendum)
[place] for heparin drip Indication: pulmonary embolus  Allergies  Allergen Reactions  . Celebrex [Celecoxib] Hives   Patient Measurements: Height: '6\' 1"'$  (185.4 cm) Weight: 195 lb (88.5 kg) IBW/kg (Calculated) : 79.9 Heparin Dosing Weight: 88.5kg  Vital Signs: Temp: 99.1 F (37.3 C) (10/29 0521) Temp Source: Oral (10/29 0521) BP: 135/67 (10/29 0521) Pulse Rate: 84 (10/29 0521)  Labs:  Recent Labs  03/14/16 0538  03/15/16 0527 03/15/16 1550 03/16/16 0445  HGB 8.7*  --  8.7*  --  8.8*  HCT 25.7*  --  26.4*  --  26.2*  PLT 224  --  250  --  259  HEPARINUNFRC  --   < > 0.68 0.40 0.36  CREATININE 1.31*  --  1.44*  --   --   < > = values in this interval not displayed.  Estimated Creatinine Clearance: 47 mL/min (by C-G formula based on SCr of 1.44 mg/dL (H)).   Assessment: 81 yo male with metastatic lung cancer and recent acute PE (started on xarelto) presented 10/22 with acute GI bleed.  Xarelto held since admission.  Pharmacy consulted to start heparin drip 10/27.  LD xarelto 10/22  Significant events:  Today, 03/16/2016:  CBC: H/H low but stable;   Most recent heparin level therapeutic on 1350 units/hr but dropping steadily. Rate only charted as reduced last night, but confirmed with RN yesterday AM that pump was running at 13.5 ml/hr  No bleeding or infusion issues per nursing  CrCl: 47 ml/min  Goal of Therapy: Heparin level 0.3-0.7 units/ml Monitor platelets by anticoagulation protocol: Yes  Plan:  Continue heparin IV infusion at 1350 units/hr. Level dropping, but given recent GIB, will try to keep patient at lower end of therapeutic range for now.  Daily CBC, heparin level.  Monitor for signs of bleeding or thrombosis   Reuel Boom, PharmD Pager: [phone] 03/16/2016, 8:22 AM    ADDENDUM:  Discharging today on Lovenox  Stop heparin and start Lovenox 90 mg q12 hr after heparin off x 1 hr  Reuel Boom, PharmD, BCPS Pager: [phone] 03/16/2016, 1:55 PM

## 2016-03-16 NOTE — Care Management Important Message (Signed)
Important Message  Patient Details  Name: Tim Ross MRN: [id number] Date of Birth: Apr 28, 1935   Medicare Important Message Given:  Yes    Erenest Rasher, RN 03/16/2016, 2:09 PM

## 2016-03-16 NOTE — Progress Notes (Signed)
Daily Rounding Note  03/16/2016, 11:49 AM  LOS: 7 days   SUBJECTIVE:   Chief complaint:     No bleeding for nearly 1 week.  Feels well though somewhat fatigued.    OBJECTIVE:         Vital signs in last 24 hours:    Temp:  [98.3 F (36.8 C)-99.1 F (37.3 C)] 99.1 F (37.3 C) (10/29 0521) Pulse Rate:  [84-108] 84 (10/29 0521) Resp:  [16-22] 18 (10/29 0521) BP: (134-152)/(67-74) 135/67 (10/29 0521) SpO2:  [91 %-92 %] 91 % (10/29 0521) Last BM Date: 03/15/16 Filed Weights   03/09/16 1432 03/11/16 1326  Weight: 88.5 kg (195 lb) 88.5 kg (195 lb)   General: looks well   Heart:  RRR Chest: clear bil.  No cough or labored breathing Abdomen: soft, NT, ND .  No HSM or masses  Extremities: no CCE.   Neuro/Psych:  Pleasant, cooperative. No gross deficits.  Fully alert and oriented.   Intake/Output from previous day: 10/28 0701 - 10/29 0700 In: 1171.9 [P.O.:840; I.V.:331.9] Out: 500 [Urine:500]  Intake/Output this shift: Total I/O In: 240 [P.O.:240] Out: 1 [Stool:1]  Lab Results:  Recent Labs  03/14/16 0538 03/15/16 0527 03/16/16 0445  WBC 14.1* 14.6* 10.7*  HGB 8.7* 8.7* 8.8*  HCT 25.7* 26.4* 26.2*  PLT 224 250 259   BMET  Recent Labs  03/14/16 0538 03/15/16 0527  NA 143 141  K 3.9 3.9  CL 112* 112*  CO2 23 23  GLUCOSE 127* 98  BUN 31* 34*  CREATININE 1.31* 1.44*  CALCIUM 8.4* 8.0*   LFT No results for input(s): PROT, ALBUMIN, AST, ALT, ALKPHOS, BILITOT, BILIDIR, IBILI in the last 72 hours. PT/INR No results for input(s): LABPROT, INR in the last 72 hours. Hepatitis Panel No results for input(s): HEPBSAG, HCVAB, HEPAIGM, HEPBIGM in the last 72 hours.  Studies/Results: No results found.   Scheduled Meds: . feeding supplement (ENSURE ENLIVE)  237 mL Oral BID BM  . mouth rinse  15 mL Mouth Rinse BID  . metoprolol tartrate  12.5 mg Oral BID  . nystatin  5 mL Oral QID  . pantoprazole  40  mg Oral BID  . senna-docusate  1 tablet Oral BID  . sodium chloride flush  3 mL Intravenous Q12H   Continuous Infusions: . heparin 1,350 Units/hr (03/16/16 0900)   PRN Meds:.acetaminophen, oxyCODONE, zolpidem   ASSESMENT:   *  GI bleed.  Melena.   10/24 EGD: normal except for non-obstructing Schatzki ring.  No source for bleeding 10/27 Colonoscopy: poor prep. Pan diverticulosis.  No active bleeding/fresh blood 10/27 Capsule endoscopy: one or 2 nonbleeding AVMs in the mid small bowel, probably the proximal ileum.  These are likely source of recent bleeding.   *  Blood loss anemia.  S/p PRBC x 4, lastest on 10/26. Hgb stable.   *  Metastatic non-small cell lung cancer: lytic hip lesion, cytology + mediastinal adenopathy.   *  Pulmonary emboli dx ~ 10/13 ,  xarelto off, IV Heparin in place.    PLAN   *  As outlined by Dr Loletha Carrow in plan of 10/28:  Referral for advanced endoscopy (spiral endoscopy or dbl balloon enteroscopy) at [facility].  This can be outpt referral.  Since he is no longer bleeding, he will not be a candidate for inpt transfer.  Plus he has outpt oncologist initial eval set for 10/31.  *  Once daily PPI (  at home takes Omeprazole 20 mg every other day) CBC in AM.    *  Pt and family prefer he d/c home on Lovenox (if it can be financed) and make oncology appt and then have out pt advanced endoscopy scheduled for later in week.         Azucena Freed  03/16/2016, 11:49 AM Pager: [phone]   I have discussed the case with the PA, and that is the plan I formulated. I personally interviewed and examined the patient.  CC: melena and anemia  He has no overt bleeding on heparin for the last 48 hrs. He can go home today on (preferably) lovenox so that it can be stopped evening before a procedure.  If that is cost-prohibitive, then have him resume [place] and we will try for a procedure at [facility] or [facility] later in the week.  I will message my nurse to work on this  tomorrow when I am off duty.  Discussed with him and his family and medicine hospitalist.    [name] Pager [phone]  Mon-Fri 8a-5p [phone] after 5p, weekends, holidays

## 2016-03-17 ENCOUNTER — Encounter (HOSPITAL_COMMUNITY): Payer: Self-pay | Admitting: Gastroenterology

## 2016-03-17 ENCOUNTER — Telehealth: Payer: Self-pay

## 2016-03-17 NOTE — Telephone Encounter (Signed)
D/C: 03/16/16 To: Home Pt did not want to schedule appt at this time. Pt is waiting to see oncology for new met ca dx and [facility] for GI bleed. Pt will call later this week to possibly schedule appt. Reminded pt that he needs repeat labs.   Transition Care Management Follow-up Telephone Call  How have you been since you were released from the hospital? good   Do you understand why you were in the hospital? yes   Do you understand the discharge instrcutions? yes  Items Reviewed:  Medications reviewed: yes  Allergies reviewed: yes  Dietary changes reviewed: yes  Referrals reviewed: yes   Functional Questionnaire:   Activities of Daily Living (ADLs):   He states they are independent in the following: ambulation, bathing and hygiene, feeding, continence, grooming, toileting and dressing States they require assistance with the following: none   Any transportation issues/concerns?: no   Any patient concerns? no   Confirmed importance and date/time of follow-up visits scheduled: yes   Confirmed with patient if condition begins to worsen call PCP or go to the ER.  Patient was given the Call-a-Nurse line [phone]: yes

## 2016-03-18 ENCOUNTER — Ambulatory Visit: Payer: Medicare Other | Admitting: Internal Medicine

## 2016-03-18 ENCOUNTER — Inpatient Hospital Stay: Admission: RE | Admit: 2016-03-18 | Payer: Medicare Other | Source: Ambulatory Visit

## 2016-03-19 ENCOUNTER — Ambulatory Visit (INDEPENDENT_AMBULATORY_CARE_PROVIDER_SITE_OTHER): Payer: Medicare Other | Admitting: Family Medicine

## 2016-03-19 ENCOUNTER — Encounter: Payer: Self-pay | Admitting: Family Medicine

## 2016-03-19 VITALS — BP 118/62 | HR 98 | Temp 98.4°F | Wt 192.4 lb

## 2016-03-19 DIAGNOSIS — D62 Acute posthemorrhagic anemia: Secondary | ICD-10-CM

## 2016-03-19 DIAGNOSIS — I10 Essential (primary) hypertension: Secondary | ICD-10-CM

## 2016-03-19 DIAGNOSIS — C3490 Malignant neoplasm of unspecified part of unspecified bronchus or lung: Secondary | ICD-10-CM | POA: Diagnosis not present

## 2016-03-19 LAB — BASIC METABOLIC PANEL
BUN: 25 mg/dL — ABNORMAL HIGH (ref 6–23)
CALCIUM: 8.6 mg/dL (ref 8.4–10.5)
CO2: 24 meq/L (ref 19–32)
CREATININE: 1.37 mg/dL (ref 0.40–1.50)
Chloride: 101 mEq/L (ref 96–112)
GFR: 53.21 mL/min — AB (ref >60.00)
GLUCOSE: 88 mg/dL (ref 70–99)
Potassium: 4 mEq/L (ref 3.5–5.1)
Sodium: 135 mEq/L (ref 135–145)

## 2016-03-19 LAB — CBC
HEMATOCRIT: 27.5 % — AB (ref 39.0–52.0)
HEMOGLOBIN: 9.2 g/dL — AB (ref 13.0–17.0)
MCHC: 33.4 g/dL (ref 30.0–36.0)
MCV: 90.6 fl (ref 78.0–100.0)
Platelets: 383 10*3/uL (ref 150.0–400.0)
RBC: 3.04 Mil/uL — AB (ref 4.22–5.81)
RDW: 15.5 % (ref 11.5–15.5)
WBC: 12.3 10*3/uL — AB (ref 4.0–10.5)

## 2016-03-19 NOTE — Assessment & Plan Note (Signed)
During hospitalization- hctz discontinued

## 2016-03-19 NOTE — Assessment & Plan Note (Signed)
S: controlled despite transition form HCTZ 12.'5mg'$ --> metoprolol 12.'5mg'$  BID BP Readings from Last 3 Encounters:  03/19/16 118/62  03/16/16 135/67  03/04/16 (!) 148/74  A/P:Continue current medicatoins, doing well

## 2016-03-19 NOTE — Assessment & Plan Note (Addendum)
Neoplasm related pain Acute blood loss anemia S: Patient was hospitalized for fatigue and SOB after having melena and found to have hgb near 6- received 4 units of blood with improvement in symptoms. Hgb 8.8 before discharge. This likely started after xarelto started for DVT/PE which was result of likely non small cell lung cancer with metastasis to right humeral head, lymph nodes and possibly brain. Lymph node biopsy 10/25 metastatic non small cell disease. Lesion found in upper GI system requiring intervention at [place] with likely cauterization though he is no longer having bleeding or melena. Patient also has follow up locally with oncology with Dr. Irene Limbo 03/27/16. Regarding left shoulder pain- controlled reasonably well with oxycodone prn use. Has some edema in both legs and right arm wanted to discuss A/P:  Patients main question was edema in legs and right arm- discussed in legs this was most likely related to DVT- could be similar cause in right arm potentially and already on treatment with lovenox so should continue. Anemia could also contribute- also less activity with right arm due to cancer related pain couldbe causing some dependent edema.   Bleeding appears stabilized. Continue GI follow up with [facility]. Oncology follow up locally next week. Also- update bmp and cbc today. Continue lovenox injections for now- hopeful that xarelto or other anticoagulant like eliquis may be an option if cauterization of culprit lesion.   Regarding shoulder pain- discussed if runs out before seeing Dr. Irene Limbo I would be willing to refill

## 2016-03-19 NOTE — Progress Notes (Signed)
Subjective:  Tim Ross is a 81 y.o. year old very pleasant male patient who presents for/with See problem oriented charting ROS- still some fatigue but no more shortnss of breath, denies chest pain. Severe right shoulder pain at times.see any ROS included in HPI as well.   Past Medical History-  Patient Active Problem List   Diagnosis Date Noted  . DVT of lower extremity, bilateral ([place]) 03/14/2016    Priority: High  . Metastatic Non-small cell lung cancer  03/14/2016    Priority: High  . Acute blood loss anemia     Priority: High  . Neoplasm related pain     Priority: High  . GI bleed 03/09/2016    Priority: High  . Acute pulmonary embolism ([place]) 03/04/2016    Priority: High  . Acute gout of right foot 03/14/2016    Priority: Medium  . Malnutrition of moderate degree 03/12/2016    Priority: Medium  . CKD (chronic kidney disease), stage III 06/08/2014    Priority: Medium  . Hyperglycemia 06/08/2014    Priority: Medium  . Hyperlipidemia 04/29/2007    Priority: Medium  . Essential hypertension 04/29/2007    Priority: Medium  . Former smoker 05/29/2014    Priority: Low  . Macular degeneration     Priority: Low  . GERD 04/29/2007    Priority: Low  . History of colonic polyps 04/29/2007    Priority: Low    Medications- reviewed and updated Current Outpatient Prescriptions  Medication Sig Dispense Refill  . CINNAMON PO Take 2 tablets by mouth every morning.     . enoxaparin (LOVENOX) 150 MG/ML injection Inject 0.6 mLs (90 mg total) into the skin every 12 (twelve) hours. 36 mL 0  . feeding supplement, ENSURE ENLIVE, (ENSURE ENLIVE) LIQD Take 237 mLs by mouth 2 (two) times daily between meals. 237 mL 12  . metoprolol tartrate (LOPRESSOR) 25 MG tablet Take 0.5 tablets (12.5 mg total) by mouth 2 (two) times daily. (Patient taking differently: Take 12.5 mg by mouth daily. ) 60 tablet 0  . Multiple Vitamin (MULTIVITAMIN WITH MINERALS) TABS tablet Take 1 tablet by mouth  every morning.     . Multiple Vitamins-Minerals (ICAPS) CAPS Take 1 capsule by mouth 2 (two) times daily.    . Omega-3 Fatty Acids (FISH OIL PO) Take 1 capsule by mouth every morning.     Marland Kitchen omeprazole (PRILOSEC) 20 MG capsule Take 1 capsule (20 mg total) by mouth daily. 30 capsule 0  . oxyCODONE (ROXICODONE) 5 MG immediate release tablet Take 1 tablet (5 mg total) by mouth every 4 (four) hours as needed for severe pain. 30 tablet 0  . senna-docusate (SENOKOT-S) 8.6-50 MG tablet Take 1 tablet by mouth 2 (two) times daily. 60 tablet 0   No current facility-administered medications for this visit.     Objective: BP 118/62 (BP Location: Left Arm, Patient Position: Sitting, Cuff Size: Large)   Pulse 98   Temp 98.4 F (36.9 C) (Oral)   Wt 192 lb 6.4 oz (87.3 kg)   SpO2 95%   BMI 25.38 kg/m  Gen: NAD, resting comfortably Mild pallor of mucus membranes CV: RRR no murmurs rubs or gallops Lungs: CTAB no crackles, wheeze, rhonchi  Ext: trace edema bilateral legs and right hand. No pain with palpation of right hand  Assessment/Plan:  Essential hypertension S: controlled despite transition form HCTZ 12.'5mg'$ --> metoprolol 12.'5mg'$  BID BP Readings from Last 3 Encounters:  03/19/16 118/62  03/16/16 135/67  03/04/16 (!) 148/74  A/P:Continue current medicatoins, doing well   Acute gout of right foot During hospitalization- hctz discontinued  Metastatic Non-small cell lung cancer  Neoplasm related pain Acute blood loss anemia S: Patient was hospitalized for fatigue and SOB after having melena and found to have hgb near 6- received 4 units of blood with improvement in symptoms. Hgb 8.8 before discharge. This likely started after xarelto started for DVT/PE which was result of likely non small cell lung cancer with metastasis to right humeral head, lymph nodes and possibly brain. Lymph node biopsy 10/25 metastatic non small cell disease. Lesion found in upper GI system requiring intervention at [place]  with likely cauterization though he is no longer having bleeding or melena. Patient also has follow up locally with oncology with Dr. Irene Limbo 03/27/16. Regarding left shoulder pain- controlled reasonably well with oxycodone prn use. Has some edema in both legs and right arm wanted to discuss A/P:  Patients main question was edema in legs and right arm- discussed in legs this was most likely related to DVT- could be similar cause in right arm potentially and already on treatment with lovenox so should continue. Anemia could also contribute- also less activity with right arm due to cancer related pain couldbe causing some dependent edema.   Bleeding appears stabilized. Continue GI follow up with [facility]. Oncology follow up locally next week. Also- update bmp and cbc today. Continue lovenox injections for now- hopeful that xarelto or other anticoagulant like eliquis may be an option if cauterization of culprit lesion.   Regarding shoulder pain- discussed if runs out before seeing Dr. Irene Limbo I would be willing to refill  Told patient I am available as needed through his upcoming treatments/follow up- no scheduled follow up planned  Orders Placed This Encounter  Procedures  . CBC    [place]  . Basic metabolic panel    [place]   Return precautions advised.  Garret Reddish, MD

## 2016-03-19 NOTE — Patient Instructions (Signed)
Labs before you leave  Swelling likely related to clots in the leg as well as anemia  Swelling in arm could be from a clot vs. Inflammation from cancer vs. Wearing the sling/positioning  I will follow along with Dr. Irene Limbo and the [facility] report- hopeful they can cauterize that portion and maybe get you back to xarelto

## 2016-03-19 NOTE — Progress Notes (Signed)
Pre visit review using our clinic review tool, if applicable. No additional management support is needed unless otherwise documented below in the visit note. 

## 2016-03-20 ENCOUNTER — Encounter (INDEPENDENT_AMBULATORY_CARE_PROVIDER_SITE_OTHER): Payer: Medicare Other | Admitting: Ophthalmology

## 2016-03-20 NOTE — Telephone Encounter (Signed)
All records faxed to [facility], also phone contact with Dr. Luciana Axe and Darryl Lent, RN [phone] and fax [phone]).  After speaking with Dr. Rush Landmark, he states that due to patient's medical history, he will need to be seen in clinic first and most likely the procedure will have to be done in the hospital. I called to [place] [facility] today to find out when patient is scheduled for clinic. His appointment is not until 12/19 @ 3:30 with Dr. Malissa Hippo. Called back to [facility], had to lvm, asked if she could work the patient in any sooner as Dr. Loletha Carrow wanted this patient's procedure done ASAP. Waiting to hear back from her.

## 2016-03-20 NOTE — Telephone Encounter (Addendum)
Dr Malissa Hippo called me late yesterday to discuss this patient.  He is doubtful that even the advanced scope and technique for the small bowel is able to reach the AVM seen on the video capsule study.  I am afraid I cannot do anything to get him seen sooner at [facility].  The good news is that Mr Tim Ross has not bled again in 11 days, including 5-6 days on anti-coagulant.   His hospital doctors felt he MUST be on anticoagulation due to the leg and lung blood clots. So at this point, my advice is for him to go back on his previous dose of Xarelto because he may very well not bleed again.  And since this is a matter of blood clots related to cancer, I am going to need his oncologist to follow him closely and if they concur.  All the more so because I am out of the office today, tomorrow and next Monday. If he passes black tarry stool again like last week, he would have to present immediately to the ED.  Recurrent bleeding would mean the blood thinner has to be reconsidered.

## 2016-03-21 ENCOUNTER — Telehealth: Payer: Self-pay | Admitting: Family Medicine

## 2016-03-21 ENCOUNTER — Telehealth: Payer: Self-pay | Admitting: *Deleted

## 2016-03-21 ENCOUNTER — Other Ambulatory Visit: Payer: Self-pay

## 2016-03-21 MED ORDER — OXYCODONE HCL 5 MG PO TABS: 5.0000 mg | ORAL_TABLET | ORAL | 0 refills | Status: DC | PRN

## 2016-03-21 NOTE — Telephone Encounter (Signed)
Pt needs new rx today oxycodone

## 2016-03-21 NOTE — Telephone Encounter (Signed)
Prescription sent to [facility]

## 2016-03-21 NOTE — Telephone Encounter (Signed)
Spoke to patient's wife, she is aware of appointment at [facility] on 12/19. I did relate the conversation to her from Dr. Malissa Hippo and Dr. Loletha Carrow, encouraged her to keep the appointment with them to help answer their  questions. I have given her Dr. Loletha Carrow' recommendations to restart Xarelto and if they see black tarry stools to go to the ED immediately. I have asked her to contact Dr. Irene Limbo for directions on Xarelto, they do have appointment with him on 11/9. She did state they only have enough Lovenox to last through Sunday. Asked if she did not get answers about the medication to contact PCP or [place] today.

## 2016-03-21 NOTE — Telephone Encounter (Signed)
Wife called stating pt has been told by GI doctor to d/c lovenox injections and go back on xarelto, but wanted to ask Dr. Grier Mitts opinion.  Per Dr. Irene Limbo, pt should not stop lovenox injections at this time.  LVM with patient advising him to stay on injections until further notice.

## 2016-03-27 ENCOUNTER — Ambulatory Visit (HOSPITAL_BASED_OUTPATIENT_CLINIC_OR_DEPARTMENT_OTHER): Payer: Medicare Other | Admitting: Hematology

## 2016-03-27 ENCOUNTER — Ambulatory Visit (HOSPITAL_BASED_OUTPATIENT_CLINIC_OR_DEPARTMENT_OTHER): Payer: Medicare Other

## 2016-03-27 ENCOUNTER — Telehealth: Payer: Self-pay | Admitting: Hematology

## 2016-03-27 ENCOUNTER — Encounter: Payer: Self-pay | Admitting: Hematology

## 2016-03-27 VITALS — BP 141/69 | HR 111 | Temp 98.2°F | Resp 18 | Wt 188.1 lb

## 2016-03-27 DIAGNOSIS — C7970 Secondary malignant neoplasm of unspecified adrenal gland: Secondary | ICD-10-CM

## 2016-03-27 DIAGNOSIS — D500 Iron deficiency anemia secondary to blood loss (chronic): Secondary | ICD-10-CM

## 2016-03-27 DIAGNOSIS — C7951 Secondary malignant neoplasm of bone: Secondary | ICD-10-CM | POA: Diagnosis not present

## 2016-03-27 DIAGNOSIS — C3492 Malignant neoplasm of unspecified part of left bronchus or lung: Secondary | ICD-10-CM

## 2016-03-27 DIAGNOSIS — G893 Neoplasm related pain (acute) (chronic): Secondary | ICD-10-CM

## 2016-03-27 DIAGNOSIS — I2699 Other pulmonary embolism without acute cor pulmonale: Secondary | ICD-10-CM

## 2016-03-27 DIAGNOSIS — E46 Unspecified protein-calorie malnutrition: Secondary | ICD-10-CM | POA: Diagnosis not present

## 2016-03-27 LAB — CBC & DIFF AND RETIC
BASO%: 0.1 % (ref 0.0–2.0)
Basophils Absolute: 0 10*3/uL (ref 0.0–0.1)
EOS%: 1.6 % (ref 0.0–7.0)
Eosinophils Absolute: 0.2 10*3/uL (ref 0.0–0.5)
HCT: 28.6 % — ABNORMAL LOW (ref 38.4–49.9)
HGB: 9.2 g/dL — ABNORMAL LOW (ref 13.0–17.1)
IMMATURE RETIC FRACT: 22.5 % — AB (ref 3.00–10.60)
LYMPH#: 2.1 10*3/uL (ref 0.9–3.3)
LYMPH%: 14.9 % (ref 14.0–49.0)
MCH: 29.2 pg (ref 27.2–33.4)
MCHC: 32.2 g/dL (ref 32.0–36.0)
MCV: 90.8 fL (ref 79.3–98.0)
MONO#: 1.4 10*3/uL — AB (ref 0.1–0.9)
MONO%: 9.9 % (ref 0.0–14.0)
NEUT%: 73.5 % (ref 39.0–75.0)
NEUTROS ABS: 10.1 10*3/uL — AB (ref 1.5–6.5)
Platelets: 532 10*3/uL — ABNORMAL HIGH (ref 140–400)
RBC: 3.15 10*6/uL — AB (ref 4.20–5.82)
RDW: 15.1 % — ABNORMAL HIGH (ref 11.0–14.6)
RETIC CT ABS: 83.48 10*3/uL (ref 34.80–93.90)
Retic %: 2.65 % — ABNORMAL HIGH (ref 0.80–1.80)
WBC: 13.8 10*3/uL — AB (ref 4.0–10.3)

## 2016-03-27 LAB — COMPREHENSIVE METABOLIC PANEL
ALT: 53 U/L (ref 0–55)
AST: 38 U/L — ABNORMAL HIGH (ref 5–34)
Albumin: 2 g/dL — ABNORMAL LOW (ref 3.5–5.0)
Alkaline Phosphatase: 83 U/L (ref 40–150)
Anion Gap: 11 mEq/L (ref 3–11)
BILIRUBIN TOTAL: 0.25 mg/dL (ref 0.20–1.20)
BUN: 26.7 mg/dL — ABNORMAL HIGH (ref 7.0–26.0)
CHLORIDE: 103 meq/L (ref 98–109)
CO2: 22 meq/L (ref 22–29)
Calcium: 9.1 mg/dL (ref 8.4–10.4)
Creatinine: 1.4 mg/dL — ABNORMAL HIGH (ref 0.7–1.3)
EGFR: 49 mL/min/{1.73_m2} — AB (ref >90)
GLUCOSE: 112 mg/dL (ref 70–140)
Potassium: 4.2 mEq/L (ref 3.5–5.1)
SODIUM: 136 meq/L (ref 136–145)
TOTAL PROTEIN: 6.4 g/dL (ref 6.4–8.3)

## 2016-03-27 LAB — TECHNOLOGIST REVIEW

## 2016-03-27 MED ORDER — FOLIC ACID 1 MG PO TABS: 1.0000 mg | ORAL_TABLET | Freq: Every day | ORAL | 3 refills | Status: DC

## 2016-03-27 MED ORDER — POLYETHYLENE GLYCOL 3350 17 G PO PACK: 17.0000 g | PACK | Freq: Every day | ORAL | 1 refills | Status: DC

## 2016-03-27 MED ORDER — SENNOSIDES-DOCUSATE SODIUM 8.6-50 MG PO TABS: 2.0000 | ORAL_TABLET | Freq: Two times a day (BID) | ORAL | 2 refills | Status: DC

## 2016-03-27 MED ORDER — CYANOCOBALAMIN 1000 MCG SL SUBL: 1000.0000 ug | SUBLINGUAL_TABLET | Freq: Every day | SUBLINGUAL | 0 refills | Status: DC

## 2016-03-27 MED ORDER — DEXAMETHASONE 4 MG PO TABS: ORAL_TABLET | ORAL | 1 refills | Status: DC

## 2016-03-27 MED ORDER — ONDANSETRON HCL 8 MG PO TABS: 8.0000 mg | ORAL_TABLET | Freq: Two times a day (BID) | ORAL | 1 refills | Status: DC | PRN

## 2016-03-27 MED ORDER — OXYCODONE HCL 5 MG PO TABS: 5.0000 mg | ORAL_TABLET | ORAL | 0 refills | Status: DC | PRN

## 2016-03-27 MED ORDER — OXYCODONE HCL ER 10 MG PO T12A: 10.0000 mg | EXTENDED_RELEASE_TABLET | Freq: Two times a day (BID) | ORAL | 0 refills | Status: DC

## 2016-03-27 MED ORDER — ENOXAPARIN SODIUM 80 MG/0.8ML ~~LOC~~ SOLN: 80.0000 mg | Freq: Two times a day (BID) | SUBCUTANEOUS | 0 refills | Status: DC

## 2016-03-27 NOTE — Telephone Encounter (Signed)
Message sent to Tim Ross, CC Dr Carley Hammed and myself regarding Tx Start date . Labs added for today. Lab and follow up appointment scheduled per 03/27/16 los. AVS report and appointment schedule was given to patient per 03/27/16 los.

## 2016-03-27 NOTE — Progress Notes (Signed)
START ON PATHWAY REGIMEN - Non-Small Cell Lung  [id number]: Carboplatin AUC=5 + Pemetrexed 500 mg/m2 q21 Days x 4 Cycles   A cycle is every 21 days:     Pemetrexed (Alimta(R)) 500 mg/m2 in 100 mL NS IV over 10 minutes followed 30 minutes later by carboplatin, manufacturer recommends not administering to patients with CrCl < 45 mL/min Dose Mod: None     Carboplatin (Paraplatin(R)) AUC=5 in 250 mL NS IV over 1 hour Dose Mod: None Additional Orders: Note: Patient to receive the following prior to the initiation of therapy: 1) Dexamethasone 4 mg orally twice daily x 6 doses.  First dose 24 hours before chemotherapy. 2) Folic acid >= 315 mcg orally daily.  First dose at least 5 days prior to the first dose of pemetrexed. 3) Vitamin B12 1,000 mcg intramuscularly every 9 weeks.  First dose at least 5 days prior to the first dose of pemetrexed.  All AUC calculations intended to be used in [employer]  **Always confirm dose/schedule in your pharmacy ordering system**    Patient Characteristics: Stage IV Metastatic, Non Squamous, Initial Chemotherapy/Immunotherapy, PS = 0, 1, PD-L1 Expression Positive 1-49% (TPS) / Negative / Not Tested / Not a Candidate for Immunotherapy AJCC M Stage: X AJCC N Stage: X AJCC T Stage: X Current Disease Status: Distant Metastases AJCC Stage Grouping: IV Histology: Non Squamous Cell ROS1 Rearrangement Status: Awaiting Test Results T790M Mutation Status: Not Applicable - EGFR Mutation Negative/Unknown Other Mutations/Biomarkers: No Other Actionable Mutations PD-L1 Expression Status: Quantity Not Sufficient Chemotherapy/Immunotherapy LOT: Initial Chemotherapy/Immunotherapy Molecular Targeted Therapy: Not Appropriate ALK Translocation Status: Awaiting Test Results Would you be surprised if this patient died  in the next year? I would be surprised if this patient died in the next year EGFR Mutation Status: Awaiting Test Results BRAF V600E Mutation Status: Awaiting  Test Results Performance Status: PS = 0, 1  Intent of Therapy: Non-Curative / Palliative Intent, Discussed with Patient

## 2016-03-27 NOTE — Progress Notes (Signed)
Per Dr. Irene Limbo, RN called pt to instruct him to pick up home meds (zofran/folic EPPI/RJJ/O84) from pharmacy.   Per MD, pt can disregard home dose of B12 if we can get him B12 injection 5 days prior to treatment.  RN set up schedule for B12 injection, Darden Dates RN to administer following chemo edu tomorrow.  RN called patient to inform, but no answer.  Left message with Abigail Butts in chemo edu to inform pt to pick up meds from pharmacy.

## 2016-03-28 ENCOUNTER — Telehealth: Payer: Self-pay | Admitting: *Deleted

## 2016-03-28 ENCOUNTER — Ambulatory Visit: Payer: Medicare Other

## 2016-03-28 ENCOUNTER — Ambulatory Visit (HOSPITAL_BASED_OUTPATIENT_CLINIC_OR_DEPARTMENT_OTHER): Payer: Medicare Other

## 2016-03-28 DIAGNOSIS — C3492 Malignant neoplasm of unspecified part of left bronchus or lung: Secondary | ICD-10-CM | POA: Diagnosis present

## 2016-03-28 DIAGNOSIS — C7970 Secondary malignant neoplasm of unspecified adrenal gland: Secondary | ICD-10-CM

## 2016-03-28 DIAGNOSIS — C7951 Secondary malignant neoplasm of bone: Secondary | ICD-10-CM

## 2016-03-28 LAB — FERRITIN: Ferritin: 624 ng/ml — ABNORMAL HIGH (ref 22–316)

## 2016-03-28 LAB — IRON AND TIBC
%SAT: 6 % — AB (ref 20–55)
IRON: 12 ug/dL — AB (ref 42–163)
TIBC: 194 ug/dL — ABNORMAL LOW (ref 202–409)
UIBC: 182 ug/dL (ref 117–376)

## 2016-03-28 MED ORDER — CYANOCOBALAMIN 1000 MCG/ML IJ SOLN
INTRAMUSCULAR | Status: AC
  Filled 2016-03-28: qty 1

## 2016-03-28 MED ORDER — CYANOCOBALAMIN 1000 MCG/ML IJ SOLN
1000.0000 ug | Freq: Once | INTRAMUSCULAR | Status: AC
  Administered 2016-03-28: 1000 ug via SUBCUTANEOUS

## 2016-03-28 NOTE — Progress Notes (Signed)
Histology and Location of Primary Cancer: non small cell lung cancer  Sites of Visceral and Bony Metastatic Disease: PET scan from 03/05/16 found "Primarily left upper lobe infiltrative hypermetabolic mass with extensive mediastinal and left hilar hypermetabolic adenopathy, osseous metastatic lesions to the right humeral head and left T10 vertebra, and possible collision metastatic lesion of the left adrenal gland with an adrenal adenoma."  Patient also had an MRI of the brain which found "early metastatic disease to the brain."  Location(s) of Symptomatic Metastases: right humerus  Past/Anticipated chemotherapy by medical oncology, if any: Carboplatin AUC=5 + Pemetrexed 500 mg/m2 q21 Days x 4 Cycles. Had his first chemotherapy treatment yesterday.  Pain on a scale of 0-10 is: 5 he is taking OxyContin 10 mg q 12 hours and oxycodone 5 mg for breath through.  Ambulatory status? Walker? Wheelchair?: Ambulatory  SAFETY ISSUES:  Prior radiation? no  Pacemaker/ICD? no  Possible current pregnancy? no  Is the patient on methotrexate? no  Current Complaints / other details:  Patient reports having a cough for about a week.  He said it is from a PE that he had 3 weeks ago.  He is here with his wife.  He also has been having edema of his right hand and bil ankles since he was in the hospital.  He mentioned that he is also having some pain in his right big toe joint from gout.  He is wondering if he can take his gout medications.  Advised to check with Dr. Grier Mitts office.  BP (!) 141/60 (BP Location: Left Arm, Patient Position: Sitting)   Pulse (!) 114   Temp 98.3 F (36.8 C) (Oral)   Ht '6\' 1"'$  (1.854 m)   Wt 190 lb (86.2 kg)   SpO2 95%   BMI 25.07 kg/m    Wt Readings from Last 3 Encounters:  04/03/16 190 lb (86.2 kg)  03/27/16 188 lb 1.6 oz (85.3 kg)  03/19/16 192 lb 6.4 oz (87.3 kg)

## 2016-03-28 NOTE — Telephone Encounter (Signed)
Per LOS and staff message I have scheduled appts and notified the scheuler

## 2016-03-30 NOTE — Progress Notes (Signed)
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 Patient has been referred to [facility] for deep enteroscopy for possible ablation.   Current Treatment:  -Carboplatin/Alimta (palliative chemotherapy). Pending foundation One and PDL1 testing results. -Referred to radiation oncology for consideration of palliative radiation to the painful right humeral metastasis -will need to monitor early brain met and consider palliative RT if growing/symptomatic  INTERVAL HISTORY:  Tim Ross is here for posthospitalization follow-up for metastatic lung adenocarcinoma. After initially treating the MRI of the brain as being negative, a re-read suggests possible early brain metastases. These are completely symptomatic at this time and very subtle findings on imaging. Currently the only pain the patient has right shoulder and this has been quite bothersome and he has had to take Oxycodone round-the-clock for pain control. I discussed with Dr. Tresa Moore--- foundation One and PDL1 testing results still pending. Patient, daughter and wife are keen to get treatment started. He notes no CP or new shortness of breath at this time. No overt GI bleeding. He has an appointment at [facility] for possible deep enteroscopy for small bowel AVM ablation.  REVIEW OF SYSTEMS:    10 Point review of systems of done and is negative except as noted above.  . Past Medical History:  Diagnosis Date  . Cancer ([place])   . Colonic polyp   . GERD (gastroesophageal reflux disease)   . Hyperlipidemia   .  Hypertension   . Muscular degeneration   . Pneumonia 2002  . Testicular cyst    removed-noncancerous    . Past Surgical History:  Procedure Laterality Date  . CATARACT EXTRACTION     right side  . COLONOSCOPY N/A 03/14/2016   Procedure: COLONOSCOPY;  Surgeon: Doran Stabler, MD;  Location: [facility] ENDOSCOPY;  Service: Endoscopy;  Laterality: N/A;  . ESOPHAGOGASTRODUODENOSCOPY N/A 03/11/2016   Procedure: ESOPHAGOGASTRODUODENOSCOPY (EGD);  Surgeon: Doran Stabler, MD;  Location: [facility];  Service: Endoscopy;  Laterality: N/A;  . GIVENS CAPSULE STUDY N/A 03/14/2016   Procedure: GIVENS CAPSULE STUDY;  Surgeon: Doran Stabler, MD;  Location: [facility] ENDOSCOPY;  Service: Endoscopy;  Laterality: N/A;  . KNEE SURGERY     scope on both  . LUMBAR LAMINECTOMY    . TRANSURETHRAL RESECTION OF PROSTATE     resolved issues    . Social History  Substance Use Topics  . Smoking status: Former Smoker    Packs/day: 1.50    Years: 40.00    Types: Cigarettes    Quit date: 12/30/1990  . Smokeless tobacco: Never Used  . Alcohol use No    ALLERGIES:  is allergic to celebrex [celecoxib].  MEDICATIONS:  Current Outpatient Prescriptions  Medication Sig Dispense Refill  . CINNAMON PO Take 2 tablets by mouth every morning.     . enoxaparin (LOVENOX) 80 MG/0.8ML injection Inject 0.8 mLs (80 mg total) into the skin every 12 (twelve) hours. 48 mL 0  . feeding supplement, ENSURE ENLIVE, (ENSURE ENLIVE) LIQD Take 237 mLs by mouth 2 (two) times daily between meals. 237 mL 12  . Multiple Vitamin (MULTIVITAMIN WITH MINERALS)  TABS tablet Take 1 tablet by mouth every morning.     . Multiple Vitamins-Minerals (ICAPS) CAPS Take 1 capsule by mouth 2 (two) times daily.    . Omega-3 Fatty Acids (FISH OIL PO) Take 1 capsule by mouth every morning.     Tim Ross Kitchen omeprazole (PRILOSEC) 20 MG capsule Take 1 capsule (20 mg total) by mouth daily. 30 capsule 0  . oxyCODONE (ROXICODONE) 5 MG immediate release tablet Take 1-2  tablets (5-10 mg total) by mouth every 4 (four) hours as needed for moderate pain, severe pain or breakthrough pain. 60 tablet 0  . senna-docusate (SENOKOT-S) 8.6-50 MG tablet Take 2 tablets by mouth 2 (two) times daily. 60 tablet 2  . Cyanocobalamin 1000 MCG SUBL Place 1 tablet (1,000 mcg total) under the tongue daily. 14 tablet 0  . dexamethasone (DECADRON) 4 MG tablet Take 1 tab two times a day the day before Alimta chemo. Take 2 tabs two times a day starting the day after chemo for 3 days. 30 tablet 1  . folic acid (FOLVITE) 1 MG tablet Take 1 tablet (1 mg total) by mouth daily. Start 5-7 days before Alimta chemotherapy. Continue until 21 days after Alimta completed. 100 tablet 3  . ondansetron (ZOFRAN) 8 MG tablet Take 1 tablet (8 mg total) by mouth 2 (two) times daily as needed for refractory nausea / vomiting. Start on day 3 after chemo. 30 tablet 1  . oxyCODONE (OXYCONTIN) 10 mg 12 hr tablet Take 1 tablet (10 mg total) by mouth every 12 (twelve) hours. 60 tablet 0  . polyethylene glycol (MIRALAX) packet Take 17 g by mouth daily. 30 each 1   No current facility-administered medications for this visit.     PHYSICAL EXAMINATION: ECOG PERFORMANCE STATUS: 2 - Symptomatic, <50% confined to bed  . Vitals:   03/27/16 1251  BP: (!) 141/69  Pulse: (!) 111  Resp: 18  Temp: 98.2 F (36.8 C)    Filed Weights   03/27/16 1251  Weight: 188 lb 1.6 oz (85.3 kg)   .Body mass index is 24.82 kg/m.  GENERAL:alert, in no acute distress and comfortable SKIN: skin color, texture, turgor are normal, no rashes or significant lesions EYES: normal, conjunctiva are pink and non-injected, sclera clear OROPHARYNX:no exudate, no erythema and lips, buccal mucosa, and tongue normal  NECK: supple, no JVD, thyroid normal size, non-tender, without nodularity LYMPH:  no palpable lymphadenopathy in the cervical, axillary or inguinal LUNGS: clear to auscultation with normal respiratory effort HEART: regular  rate & rhythm,  no murmurs and no lower extremity edema ABDOMEN: abdomen soft, non-tender, normoactive bowel sounds  Musculoskeletal: no cyanosis of digits and no clubbing  PSYCH: alert & oriented x 3 with fluent speech NEURO: no focal motor/sensory deficits  LABORATORY DATA:   I have reviewed the data as listed  . CBC Latest Ref Rng & Units 03/27/2016 03/19/2016 03/16/2016  WBC 4.0 - 10.3 10e3/uL 13.8(H) 12.3(H) 10.7(H)  Hemoglobin 13.0 - 17.1 g/dL 9.2(L) 9.2(L) 8.8(L)  Hematocrit 38.4 - 49.9 % 28.6(L) 27.5(L) 26.2(L)  Platelets 140 - 400 10e3/uL 532(H) 383.0 259    . CMP Latest Ref Rng & Units 03/27/2016 03/19/2016 03/15/2016  Glucose 70 - 140 mg/dl 112 88 98  BUN 7.0 - 26.0 mg/dL 26.7(H) 25(H) 34(H)  Creatinine 0.7 - 1.3 mg/dL 1.4(H) 1.37 1.44(H)  Sodium 136 - 145 mEq/L 136 135 141  Potassium 3.5 - 5.1 mEq/L 4.2 4.0 3.9  Chloride 96 - 112 mEq/L - 101 112(H)  CO2  22 - 29 mEq/L 22 24 23   Calcium 8.4 - 10.4 mg/dL 9.1 8.6 8.0(L)  Total Protein 6.4 - 8.3 g/dL 6.4 - -  Total Bilirubin 0.20 - 1.20 mg/dL 0.25 - -  Alkaline Phos 40 - 150 U/L 83 - -  AST 5 - 34 U/L 38(H) - -  ALT 0 - 55 U/L 53 - -      RADIOGRAPHIC STUDIES: I have personally reviewed the radiological images as listed and agreed with the findings in the report. Dg Chest 2 View  Result Date: 03/09/2016 CLINICAL DATA:  Shortness of Breath, lung cancer EXAM: CHEST  2 VIEW COMPARISON:  03/05/2016 FINDINGS: Cardiomediastinal silhouette is stable. Persistent left hilar prominence. Persistent left perihilar interstitial prominence probable interstitial tumor spread or pneumonitis. Peripheral consolidation in left upper lobe is stable. Mild hyperinflation. Right lung is clear. Stable sclerotic lesion in right fourth rib. IMPRESSION: Persistent left hilar prominence. Persistent left perihilar interstitial prominence probable interstitial tumor spread or pneumonitis. Peripheral consolidation in left upper lobe is stable. Mild  hyperinflation. Right lung is clear. Electronically Signed   By: Lahoma Crocker M.D.   On: 03/09/2016 15:21   Tim Jeri Cos CL Contrast  Addendum Date: 03/19/2016   ADDENDUM REPORT: 03/19/2016 11:38 ADDENDUM: This study was reviewed and two subtle brain metastases are suspected. The larger is 3-4 mm in the left hippocampus (series 11, image 25 and series 12, image 16) with minimal associated hippocampal T2 hyperintensity. This is in proximity to physiologic enhancement of the left choroid plexus. No associated mass effect. A second punctate metastasis is suspected in the right occipital lobe white matter (series 11, image 29). CONCLUSION: Positive for early metastatic disease to the brain. This was discussed by telephone with Dr. Florencia Reasons on 03/19/2016 at 0900 hours today, and also with Oncologist Dr. Irene Limbo at 1127 hours. Electronically Signed   By: Genevie Ann M.D.   On: 03/19/2016 11:38   Result Date: 03/19/2016 CLINICAL DATA:  Metastatic disease.  Brain staging. EXAM: MRI HEAD WITHOUT AND WITH CONTRAST TECHNIQUE: Multiplanar, multiecho pulse sequences of the brain and surrounding structures were obtained without and with intravenous contrast. CONTRAST:  79m MULTIHANCE GADOBENATE DIMEGLUMINE 529 MG/ML IV SOLN COMPARISON:  None. FINDINGS: Brain: No abnormal intracranial enhancement to suggest metastatic disease. The right frontal extra-axial low-density appearance on previous PET-CT reflects a small hygroma without significant mass effect. Small hygroma also present around the right cerebellum. Age congruent brain volume and white matter appearance. No hydrocephalus. Vascular: Normal flow voids. Skull and upper cervical spine: No evidence of metastasis Sinuses/Orbits: Bilateral cataract resection. IMPRESSION: Negative for intracranial metastasis. The right frontal finding on previous PET-CT is a small hygroma. Electronically Signed: By: JMonte FantasiaM.D. On: 03/10/2016 11:15   Nm Pet Image Initial (pi) Skull Base To  Thigh  Result Date: 03/05/2016 CLINICAL DATA:  Initial treatment strategy for lung nodule. EXAM: NUCLEAR MEDICINE PET SKULL BASE TO THIGH TECHNIQUE: 12.3 mCi F-18 FDG was injected intravenously. Full-ring PET imaging was performed from the skull base to thigh after the radiotracer. CT data was obtained and used for attenuation correction and anatomic localization. COMPARISON:  Multiple exams, including 02/29/2016 FINDINGS: NECK Please note that inadvertently the entire head was included. There is asymmetric hypodensity along the right frontal subdural space which could be from subdural hygroma or chronic subdural hematoma, MRI of the brain with and without contrast should be considered for further assessment. I do not see a large mass in the brain on the  PET images. There is pathologic supraclavicular adenopathy with a 9 mm left supraclavicular node on image 78 78/4 having a maximum standard uptake value of 10.1 and a cluster of small right supraclavicular nodes having a maximum standard uptake value of 8.5 CHEST There is pathologic upper paratracheal, lower paratracheal, AP window, prevascular, and subcarinal hypermetabolic adenopathy in addition to left hilar and infrahilar adenopathy in the chest. Index AP window lymph node 1.5 cm in short axis with maximum standard uptake value 25.5. Subcarinal lymph node 2.0 cm in short axis with maximum standard uptake value 19.0. There is patchy airspace opacity with nodularity in the left upper lobe which has significantly hypermetabolic components, maximum standard uptake value 24.0 faintly hypermetabolic activity in the left lower lobe, from inflammation or possibly lymphangitic spread. Trace left pleural effusion, nonspecific for malignancy. Severe emphysema. Coronary, aortic arch, and branch vessel atherosclerotic vascular disease. ABDOMEN/PELVIS The left adrenal gland primarily has low density characteristics typical for adenoma, with central density measurement of 1  Hounsfield unit. However, there is marginal high activity along the left side of the adrenal gland with maximum SUV up to 8.7, raise the possibility of a collision lesion with early metastatic disease to the left adrenal gland. Within the prostate gland, there is a central focus of high activity with maximum SUV of 19.15. All this could possibly be from prostatic urethral urine activity rather than tumor, there is also a left anterior prostate base focus of accentuated activity with maximum and standard uptake value of 17.7. Appearance concerning for prostate malignancy. Suspected left scrotal hydrocele. Bilateral renal cysts. Sigmoid colon diverticulosis. SKELETON 5 cm metastatic lesion in the right proximal humerus mainly involving the humeral head, maximum SUV 49.6. Lucent metastatic lesion of the left T10 vertebra, maximum SUV 36.7. IMPRESSION: 1. Primarily left upper lobe infiltrative hypermetabolic mass with extensive mediastinal and left hilar hypermetabolic adenopathy, osseous metastatic lesions to the right humeral head and left T10 vertebra, and possible collision metastatic lesion of the left adrenal gland with an adrenal adenoma. 2. Central and left anterior base hypermetabolic activity in the prostate gland, concerning for prostate cancer. 3. Asymmetric extra-axial hypodensity along the right frontal lobe, possibly a chronic subdural hematoma or cystic hygroma, under the circumstances I recommend MRI of the brain with and without contrast for further workup and to assess for intracranial metastatic disease. Other imaging findings of potential clinical significance: Severe emphysema. Coronary, aortic arch, and branch vessel atherosclerotic vascular disease. Aortoiliac atherosclerotic vascular disease. Bilateral renal cysts. Sigmoid colon diverticulosis. Left scrotal hydrocele. Electronically Signed   By: Van Clines M.D.   On: 03/05/2016 15:34   US Biopsy  Result Date: 03/12/2016 INDICATION:  81 year old with an infiltrative left upper lobe lesion. Concern for metastatic lung cancer and tissue diagnosis is needed. There are hypermetabolic supraclavicular lymph nodes. EXAM: ULTRASOUND-GUIDED CORE BIOPSIES AND FINE-NEEDLE ASPIRATIONS OF RIGHT SUPRACLAVICULAR LYMPH NODE MEDICATIONS: None. ANESTHESIA/SEDATION: Moderate (conscious) sedation was employed during this procedure. A total of Versed 1.0 mg and Fentanyl 50 mcg was administered intravenously. Moderate Sedation Time: 21 minutes. The patient's level of consciousness and vital signs were monitored continuously by radiology nursing throughout the procedure under my direct supervision. FLUOROSCOPY TIME:  None COMPLICATIONS: None immediate. PROCEDURE: Informed written consent was obtained from the patient after a thorough discussion of the procedural risks, benefits and alternatives. All questions were addressed. A timeout was performed prior to the initiation of the procedure. Both sides of the neck evaluated with ultrasound. Small bilateral supraclavicular lymph nodes were  identified. Right supraclavicular lymph node adjacent to the right internal jugular vein was felt to be most amendable to biopsy. Right side of the neck was prepped with chlorhexidine. Skin was anesthetized with 1% lidocaine. A total of 6 ultrasound-guided fine-needle aspirations were obtained within this lymph node with 25 gauge needles. Following the fine-needle aspirations, 3 core biopsies were obtained with a 20 gauge core device using ultrasound guidance. Specimens placed in formalin. Bandage placed over the puncture site. FINDINGS: Small bilateral supraclavicular lymph nodes. A round hypoechoic lymph node just lateral to the right internal jugular vein was sampled. This node measures roughly 1 cm in the short axis. Biopsy needles confirmed within the lesion. IMPRESSION: Ultrasound-guided core biopsies and fine-needle aspirations of right supraclavicular lymph node. Electronically  Signed   By: Markus Daft M.D.   On: 03/12/2016 13:15    ASSESSMENT & PLAN:   81 year old male with  #1 Metastatic Lung Adenocarcinoma  With mets to bones, adrenal gland and possible very subtle early mets to brain. #2 painful right humerus metastatic lesion requiring significant pain medications. Plan -Foundation 1 and PDL 1 testing in progress results available at this time. -After detailed discussion with the patient and daughter and his wife we have decided to proceed with palliative chemotherapy with carboplatin and Alimta. (will dose reduce for cycle 1 for check for tolerance) -Referred to radiation oncology to consider palliative radiation of the right painful lesion. -We will need to monitor his brain metastases closely with interval imaging and might need to interpret chemotherapy and consider palliative radiation if this becomes symptomatic or shows signs of progression. -Systemic chemotherapy might have about a 30% response rate in the brain. -We'll need to monitor for anemia. Ferritin level elevated likely due to inflammation from his tumor. -Chemotherapy counseling -We'll hold off on port at this time  #3 recent GI bleeding due to small bowel AVM. No overt bleeding at this time -Would need to closely monitor for bleeding while on anticoagulation for pulmonary embolism. -As follow-up at [facility] for consideration of deep enteroscopy to try to ablate his small bowel AVM.  #4 pulmonary embolism related to metastatic malignancy. -Given new prescription to continue Lovenox. Dose was adjusted for his decreased body weight.  #5 neoplasm related pain #6  bony metastases #7 adrenal metastases-no evidence of adrenal insufficiency at this time -This is predominantly in his right shoulder due to bony metastases. -Starting Delton See for skeletal metastases -Given radiation oncology referral to consider palliative radiation to the right shoulder. -Started on OxyContin 10 mg by mouth twice daily  and continue oxycodone when necessary for breakthrough pain.  #8 protein calorie malnutrition  -Referral to Ernestene Kiel for nutritional consultation .  Return to clinic with Dr. Irene Limbo in 1 week after starting chemotherapy for toxicity check with repeat labs  I spent 40 minutes counseling the patient face to face. The total time spent in the appointment was 50 minutes and more than 50% was on counseling and direct patient cares.    Sullivan Lone MD [place] AAHIVMS [facility] Hematology/Oncology Physician [facility]  (Office):       [phone] (Work cell):  [phone] (Fax):           [phone]

## 2016-04-02 ENCOUNTER — Other Ambulatory Visit: Payer: Self-pay | Admitting: *Deleted

## 2016-04-02 ENCOUNTER — Ambulatory Visit (HOSPITAL_BASED_OUTPATIENT_CLINIC_OR_DEPARTMENT_OTHER): Payer: Medicare Other

## 2016-04-02 ENCOUNTER — Other Ambulatory Visit: Payer: Self-pay | Admitting: Hematology

## 2016-04-02 ENCOUNTER — Encounter (INDEPENDENT_AMBULATORY_CARE_PROVIDER_SITE_OTHER): Payer: Medicare Other | Admitting: Ophthalmology

## 2016-04-02 VITALS — BP 126/55 | HR 114 | Temp 97.6°F | Resp 20

## 2016-04-02 DIAGNOSIS — Z5111 Encounter for antineoplastic chemotherapy: Secondary | ICD-10-CM | POA: Diagnosis present

## 2016-04-02 DIAGNOSIS — C7970 Secondary malignant neoplasm of unspecified adrenal gland: Secondary | ICD-10-CM

## 2016-04-02 DIAGNOSIS — C3492 Malignant neoplasm of unspecified part of left bronchus or lung: Secondary | ICD-10-CM | POA: Diagnosis not present

## 2016-04-02 DIAGNOSIS — C7951 Secondary malignant neoplasm of bone: Secondary | ICD-10-CM

## 2016-04-02 MED ORDER — PALONOSETRON HCL INJECTION 0.25 MG/5ML
0.2500 mg | Freq: Once | INTRAVENOUS | Status: AC
Start: 2016-04-02 — End: 2016-04-02
  Administered 2016-04-02: 0.25 mg via INTRAVENOUS

## 2016-04-02 MED ORDER — SODIUM CHLORIDE 0.9 % IV SOLN: 300.0000 mg | Freq: Once | INTRAVENOUS | Status: DC

## 2016-04-02 MED ORDER — SODIUM CHLORIDE 0.9 % IV SOLN
300.0000 mg | Freq: Once | INTRAVENOUS | Status: AC
  Administered 2016-04-02: 300 mg via INTRAVENOUS
  Filled 2016-04-02: qty 30

## 2016-04-02 MED ORDER — SODIUM CHLORIDE 0.9 % IV SOLN: 10.0000 mg | Freq: Once | INTRAVENOUS | Status: DC

## 2016-04-02 MED ORDER — PROCHLORPERAZINE MALEATE 10 MG PO TABS: 10.0000 mg | ORAL_TABLET | Freq: Four times a day (QID) | ORAL | 0 refills | Status: DC | PRN

## 2016-04-02 MED ORDER — SODIUM CHLORIDE 0.9 % IV SOLN
355.0000 mg/m2 | Freq: Once | INTRAVENOUS | Status: AC
  Administered 2016-04-02: 750 mg via INTRAVENOUS
  Filled 2016-04-02: qty 20

## 2016-04-02 MED ORDER — DENOSUMAB 120 MG/1.7ML ~~LOC~~ SOLN
120.0000 mg | Freq: Once | SUBCUTANEOUS | Status: AC
  Administered 2016-04-02: 120 mg via SUBCUTANEOUS
  Filled 2016-04-02: qty 1.7

## 2016-04-02 MED ORDER — SODIUM CHLORIDE 0.9 % IV SOLN
Freq: Once | INTRAVENOUS | Status: AC
  Administered 2016-04-02: 11:00:00 via INTRAVENOUS

## 2016-04-02 MED ORDER — PALONOSETRON HCL INJECTION 0.25 MG/5ML
INTRAVENOUS | Status: AC
  Filled 2016-04-02: qty 5

## 2016-04-02 MED ORDER — SODIUM CHLORIDE 0.9 % IV SOLN
Freq: Once | INTRAVENOUS | Status: AC
  Administered 2016-04-02: 12:00:00 via INTRAVENOUS

## 2016-04-02 MED ORDER — DEXAMETHASONE SODIUM PHOSPHATE 10 MG/ML IJ SOLN
10.0000 mg | Freq: Once | INTRAMUSCULAR | Status: AC
  Administered 2016-04-02: 10 mg via INTRAVENOUS

## 2016-04-02 MED ORDER — DEXAMETHASONE SODIUM PHOSPHATE 10 MG/ML IJ SOLN
INTRAMUSCULAR | Status: AC
  Filled 2016-04-02: qty 1

## 2016-04-02 NOTE — Patient Instructions (Signed)
[facility] Discharge Instructions for Patients Receiving Chemotherapy  Today you received the following chemotherapy agents: Carboplatin, Alimta  To help prevent nausea and vomiting after your treatment, we encourage you to take your nausea medication as prescribed.   If you develop nausea and vomiting that is not controlled by your nausea medication, call the clinic.   BELOW ARE SYMPTOMS THAT SHOULD BE REPORTED IMMEDIATELY:  *FEVER GREATER THAN 100.5 F  *CHILLS WITH OR WITHOUT FEVER  NAUSEA AND VOMITING THAT IS NOT CONTROLLED WITH YOUR NAUSEA MEDICATION  *UNUSUAL SHORTNESS OF BREATH  *UNUSUAL BRUISING OR BLEEDING  TENDERNESS IN MOUTH AND THROAT WITH OR WITHOUT PRESENCE OF ULCERS  *URINARY PROBLEMS  *BOWEL PROBLEMS  UNUSUAL RASH Items with * indicate a potential emergency and should be followed up as soon as possible.  Feel free to call the clinic you have any questions or concerns. The clinic phone number is [phone].  Please show the [facility] at check-in to the Emergency Department and triage nurse.  Carboplatin injection What is this medicine? CARBOPLATIN (KAR boe pla tin) is a chemotherapy drug. It targets fast dividing cells, like cancer cells, and causes these cells to die. This medicine is used to treat ovarian cancer and many other cancers. This medicine may be used for other purposes; ask your health care provider or pharmacist if you have questions. COMMON BRAND NAME(S): Paraplatin What should I tell my health care provider before I take this medicine? They need to know if you have any of these conditions: -blood disorders -hearing problems -kidney disease -recent or ongoing radiation therapy -an unusual or allergic reaction to carboplatin, cisplatin, other chemotherapy, other medicines, foods, dyes, or preservatives -pregnant or trying to get pregnant -breast-feeding How should I use this medicine? This drug is usually given as  an infusion into a vein. It is administered in a hospital or clinic by a specially trained health care professional. Talk to your pediatrician regarding the use of this medicine in children. Special care may be needed. Overdosage: If you think you have taken too much of this medicine contact a poison control center or emergency room at once. NOTE: This medicine is only for you. Do not share this medicine with others. What if I miss a dose? It is important not to miss a dose. Call your doctor or health care professional if you are unable to keep an appointment. What may interact with this medicine? -medicines for seizures -medicines to increase blood counts like filgrastim, pegfilgrastim, sargramostim -some antibiotics like amikacin, gentamicin, neomycin, streptomycin, tobramycin -vaccines Talk to your doctor or health care professional before taking any of these medicines: -acetaminophen -aspirin -ibuprofen -ketoprofen -naproxen This list may not describe all possible interactions. Give your health care provider a list of all the medicines, herbs, non-prescription drugs, or dietary supplements you use. Also tell them if you smoke, drink alcohol, or use illegal drugs. Some items may interact with your medicine. What should I watch for while using this medicine? Your condition will be monitored carefully while you are receiving this medicine. You will need important blood work done while you are taking this medicine. This drug may make you feel generally unwell. This is not uncommon, as chemotherapy can affect healthy cells as well as cancer cells. Report any side effects. Continue your course of treatment even though you feel ill unless your doctor tells you to stop. In some cases, you may be given additional medicines to help with side effects. Follow all  directions for their use. Call your doctor or health care professional for advice if you get a fever, chills or sore throat, or other  symptoms of a cold or flu. Do not treat yourself. This drug decreases your body's ability to fight infections. Try to avoid being around people who are sick. This medicine may increase your risk to bruise or bleed. Call your doctor or health care professional if you notice any unusual bleeding. Be careful brushing and flossing your teeth or using a toothpick because you may get an infection or bleed more easily. If you have any dental work done, tell your dentist you are receiving this medicine. Avoid taking products that contain aspirin, acetaminophen, ibuprofen, naproxen, or ketoprofen unless instructed by your doctor. These medicines may hide a fever. Do not become pregnant while taking this medicine. Women should inform their doctor if they wish to become pregnant or think they might be pregnant. There is a potential for serious side effects to an unborn child. Talk to your health care professional or pharmacist for more information. Do not breast-feed an infant while taking this medicine. What side effects may I notice from receiving this medicine? Side effects that you should report to your doctor or health care professional as soon as possible: -allergic reactions like skin rash, itching or hives, swelling of the face, lips, or tongue -signs of infection - fever or chills, cough, sore throat, pain or difficulty passing urine -signs of decreased platelets or bleeding - bruising, pinpoint red spots on the skin, black, tarry stools, nosebleeds -signs of decreased red blood cells - unusually weak or tired, fainting spells, lightheadedness -breathing problems -changes in hearing -changes in vision -chest pain -high blood pressure -low blood counts - This drug may decrease the number of white blood cells, red blood cells and platelets. You may be at increased risk for infections and bleeding. -nausea and vomiting -pain, swelling, redness or irritation at the injection site -pain, tingling,  numbness in the hands or feet -problems with balance, talking, walking -trouble passing urine or change in the amount of urine Side effects that usually do not require medical attention (report to your doctor or health care professional if they continue or are bothersome): -hair loss -loss of appetite -metallic taste in the mouth or changes in taste This list may not describe all possible side effects. Call your doctor for medical advice about side effects. You may report side effects to FDA at 1-800-FDA-1088. Where should I keep my medicine? This drug is given in a hospital or clinic and will not be stored at home. NOTE: This sheet is a summary. It may not cover all possible information. If you have questions about this medicine, talk to your doctor, pharmacist, or health care provider.  2017 [employer] Standard (2007-08-10 14:38:05) Pemetrexed injection What is this medicine? PEMETREXED (PEM e TREX ed) is a chemotherapy drug. This medicine affects cells that are rapidly growing, such as cancer cells and cells in your mouth and stomach. It is usually used to treat lung cancers like non-small cell lung cancer and mesothelioma. It may also be used to treat other cancers. This medicine may be used for other purposes; ask your health care provider or pharmacist if you have questions. COMMON BRAND NAME(S): Alimta What should I tell my health care provider before I take this medicine? They need to know if you have any of these conditions: -if you frequently drink alcohol containing beverages -infection (especially a virus infection such as chickenpox, cold  sores, or herpes) -kidney disease -liver disease -low blood counts, like low platelets, red bloods, or white blood cells -an unusual or allergic reaction to pemetrexed, mannitol, other medicines, foods, dyes, or preservatives -pregnant or trying to get pregnant -breast-feeding How should I use this medicine? This drug is given as an  infusion into a vein. It is administered in a hospital or clinic by a specially trained health care professional. Talk to your pediatrician regarding the use of this medicine in children. Special care may be needed. Overdosage: If you think you have taken too much of this medicine contact a poison control center or emergency room at once. NOTE: This medicine is only for you. Do not share this medicine with others. What if I miss a dose? It is important not to miss your dose. Call your doctor or health care professional if you are unable to keep an appointment. What may interact with this medicine? -aspirin and aspirin-like medicines -medicines to increase blood counts like filgrastim, pegfilgrastim, sargramostim -methotrexate -NSAIDS, medicines for pain and inflammation, like ibuprofen or naproxen -probenecid -pyrimethamine -vaccines Talk to your doctor or health care professional before taking any of these medicines: -acetaminophen -aspirin -ibuprofen -ketoprofen -naproxen This list may not describe all possible interactions. Give your health care provider a list of all the medicines, herbs, non-prescription drugs, or dietary supplements you use. Also tell them if you smoke, drink alcohol, or use illegal drugs. Some items may interact with your medicine. What should I watch for while using this medicine? Visit your doctor for checks on your progress. This drug may make you feel generally unwell. This is not uncommon, as chemotherapy can affect healthy cells as well as cancer cells. Report any side effects. Continue your course of treatment even though you feel ill unless your doctor tells you to stop. In some cases, you may be given additional medicines to help with side effects. Follow all directions for their use. Call your doctor or health care professional for advice if you get a fever, chills or sore throat, or other symptoms of a cold or flu. Do not treat yourself. This drug decreases  your body's ability to fight infections. Try to avoid being around people who are sick. This medicine may increase your risk to bruise or bleed. Call your doctor or health care professional if you notice any unusual bleeding. Be careful brushing and flossing your teeth or using a toothpick because you may get an infection or bleed more easily. If you have any dental work done, tell your dentist you are receiving this medicine. Avoid taking products that contain aspirin, acetaminophen, ibuprofen, naproxen, or ketoprofen unless instructed by your doctor. These medicines may hide a fever. Call your doctor or health care professional if you get diarrhea or mouth sores. Do not treat yourself. To protect your kidneys, drink water or other fluids as directed while you are taking this medicine. Men and women must use effective birth control while taking this medicine. You may also need to continue using effective birth control for a time after stopping this medicine. Do not become pregnant while taking this medicine. Tell your doctor right away if you think that you or your partner might be pregnant. There is a potential for serious side effects to an unborn child. Talk to your health care professional or pharmacist for more information. Do not breast-feed an infant while taking this medicine. This medicine may lower sperm counts. What side effects may I notice from receiving this medicine? Side  effects that you should report to your doctor or health care professional as soon as possible: -allergic reactions like skin rash, itching or hives, swelling of the face, lips, or tongue -low blood counts - this medicine may decrease the number of white blood cells, red blood cells and platelets. You may be at increased risk for infections and bleeding. -signs of infection - fever or chills, cough, sore throat, pain or difficulty passing urine -signs of decreased platelets or bleeding - bruising, pinpoint red spots on  the skin, black, tarry stools, blood in the urine -signs of decreased red blood cells - unusually weak or tired, fainting spells, lightheadedness -breathing problems, like a dry cough -changes in emotions or moods -chest pain -confusion -diarrhea -high blood pressure -mouth or throat sores or ulcers -pain, swelling, warmth in the leg -pain on swallowing -swelling of the ankles, feet, hands -trouble passing urine or change in the amount of urine -vomiting -yellowing of the eyes or skin Side effects that usually do not require medical attention (report to your doctor or health care professional if they continue or are bothersome): -hair loss -loss of appetite -nausea -stomach upset This list may not describe all possible side effects. Call your doctor for medical advice about side effects. You may report side effects to FDA at 1-800-FDA-1088. Where should I keep my medicine? This drug is given in a hospital or clinic and will not be stored at home. NOTE: This sheet is a summary. It may not cover all possible information. If you have questions about this medicine, talk to your doctor, pharmacist, or health care provider.  2017 [employer] Standard (2007-12-07 13:24:03)

## 2016-04-03 ENCOUNTER — Ambulatory Visit
Admission: RE | Admit: 2016-04-03 | Discharge: 2016-04-03 | Disposition: A | Discharge status: Home / Self Care | Payer: Medicare Other | Source: Ambulatory Visit | Attending: Radiation Oncology | Admitting: Radiation Oncology

## 2016-04-03 ENCOUNTER — Encounter (HOSPITAL_COMMUNITY): Payer: Self-pay

## 2016-04-03 ENCOUNTER — Other Ambulatory Visit: Payer: Self-pay | Admitting: *Deleted

## 2016-04-03 ENCOUNTER — Encounter: Payer: Self-pay | Admitting: Radiation Oncology

## 2016-04-03 VITALS — BP 141/60 | HR 114 | Temp 98.3°F | Ht 73.0 in | Wt 190.0 lb

## 2016-04-03 DIAGNOSIS — K219 Gastro-esophageal reflux disease without esophagitis: Secondary | ICD-10-CM | POA: Insufficient documentation

## 2016-04-03 DIAGNOSIS — Z86010 Personal history of colonic polyps: Secondary | ICD-10-CM | POA: Diagnosis not present

## 2016-04-03 DIAGNOSIS — C3490 Malignant neoplasm of unspecified part of unspecified bronchus or lung: Secondary | ICD-10-CM | POA: Diagnosis not present

## 2016-04-03 DIAGNOSIS — C7951 Secondary malignant neoplasm of bone: Secondary | ICD-10-CM | POA: Diagnosis not present

## 2016-04-03 DIAGNOSIS — Z800 Family history of malignant neoplasm of digestive organs: Secondary | ICD-10-CM | POA: Insufficient documentation

## 2016-04-03 DIAGNOSIS — Z79899 Other long term (current) drug therapy: Secondary | ICD-10-CM | POA: Insufficient documentation

## 2016-04-03 DIAGNOSIS — Z510 Encounter for antineoplastic radiation therapy: Secondary | ICD-10-CM | POA: Insufficient documentation

## 2016-04-03 DIAGNOSIS — E785 Hyperlipidemia, unspecified: Secondary | ICD-10-CM | POA: Diagnosis not present

## 2016-04-03 DIAGNOSIS — C3492 Malignant neoplasm of unspecified part of left bronchus or lung: Secondary | ICD-10-CM

## 2016-04-03 DIAGNOSIS — M109 Gout, unspecified: Secondary | ICD-10-CM | POA: Insufficient documentation

## 2016-04-03 DIAGNOSIS — M25511 Pain in right shoulder: Secondary | ICD-10-CM | POA: Diagnosis not present

## 2016-04-03 DIAGNOSIS — Z888 Allergy status to other drugs, medicaments and biological substances status: Secondary | ICD-10-CM | POA: Insufficient documentation

## 2016-04-03 DIAGNOSIS — Z833 Family history of diabetes mellitus: Secondary | ICD-10-CM | POA: Insufficient documentation

## 2016-04-03 DIAGNOSIS — Z87891 Personal history of nicotine dependence: Secondary | ICD-10-CM | POA: Diagnosis not present

## 2016-04-03 DIAGNOSIS — I10 Essential (primary) hypertension: Secondary | ICD-10-CM | POA: Diagnosis not present

## 2016-04-03 DIAGNOSIS — Z8249 Family history of ischemic heart disease and other diseases of the circulatory system: Secondary | ICD-10-CM | POA: Diagnosis not present

## 2016-04-03 MED ORDER — PROCHLORPERAZINE MALEATE 10 MG PO TABS: 10.0000 mg | ORAL_TABLET | Freq: Four times a day (QID) | ORAL | 0 refills | Status: DC | PRN

## 2016-04-03 NOTE — Progress Notes (Signed)
Radiation Oncology         [phone] ________________________________  Initial Outpatient Consultation  Name: Tim Ross MRN: [id number]  Date: 04/03/2016  DOB: 1935-04-15  FM:[id number] Yong Channel, MD  Brunetta Genera, MD   REFERRING PHYSICIAN: Brunetta Genera, MD  DIAGNOSIS: Stage IV adenocarcinoma of the lung  HISTORY OF PRESENT ILLNESS::Tim Ross is a 81 y.o. male who is seen out courtesy of Dr. Irene Limbo for an opinion concerning management of the patient's advanced adenocarcinoma of lung. The patient presented with approximately a 6 week history of right shoulder pain. He initially attributed this to painting but did not respond to injections or conventional treatments.  He had a recent hospitalization on 03/09/16 for shortness of breath due to metastatic lung adenocarcinoma. A re-read of brain MRI  suggested possible early brain metastasis. PET scan from 03/05/16 found "primarily left upper lobe infiltrative hypermetabolic mass with extensive mediastinal and left hilar hypermetabolic adenopathy, osseous metastatic lesions to the right humeral head and left T10 vertebra, and possible  metastatic lesion of the left adrenal gland with and adrenal adenoma". Biopsy of the right supraclavicular on 03/12/16 revealed metastatic non-small cell carcinoma. Additional markers are suggestive of primary adenocarcinoma of the lung. Patient is expected to start palliative chemotherapy with Carboplatin and Alimta.  In light of his severe pain in the right shoulder area he is now referred to radiation oncology for consideration for palliative treatment. He has severe limitation of his right arm and shoulder due to pain.   Patient is positive for 5/10 pain. He reports taking 10 mg Oxycontin every 12 hours and 5 mg Oxycodone. Patient reports having a cough for about a week., He said it is from a PE that he had 3 weeks ago. He also complains of edema in his right hand and ankles bilaterally since  he was in the hospital. He mentioned pain in his right big toes joint from gout.   PREVIOUS RADIATION THERAPY: No  PAST MEDICAL HISTORY:  has a past medical history of Cancer ([place]); Colonic polyp; GERD (gastroesophageal reflux disease); Hyperlipidemia; Hypertension; Muscular degeneration; Pneumonia (2002); and Testicular cyst.    PAST SURGICAL HISTORY: Past Surgical History:  Procedure Laterality Date  . CATARACT EXTRACTION     right side  . COLONOSCOPY N/A 03/14/2016   Procedure: COLONOSCOPY;  Surgeon: Doran Stabler, MD;  Location: [facility];  Service: Endoscopy;  Laterality: N/A;  . ESOPHAGOGASTRODUODENOSCOPY N/A 03/11/2016   Procedure: ESOPHAGOGASTRODUODENOSCOPY (EGD);  Surgeon: Doran Stabler, MD;  Location: [facility];  Service: Endoscopy;  Laterality: N/A;  . GIVENS CAPSULE STUDY N/A 03/14/2016   Procedure: GIVENS CAPSULE STUDY;  Surgeon: Doran Stabler, MD;  Location: [facility];  Service: Endoscopy;  Laterality: N/A;  . KNEE SURGERY     scope on both  . LUMBAR LAMINECTOMY    . TRANSURETHRAL RESECTION OF PROSTATE     resolved issues    FAMILY HISTORY: family history includes Colon cancer in his sister; Diabetes in his mother; Heart attack in his father; Heart disease in his brother; Hypertension in his mother.  SOCIAL HISTORY:  reports that he quit smoking about 25 years ago. His smoking use included Cigarettes. He has a 60.00 pack-year smoking history. He has never used smokeless tobacco. He reports that he does not drink alcohol or use drugs.  ALLERGIES: Celebrex [celecoxib]  MEDICATIONS:  Current Outpatient Prescriptions  Medication Sig Dispense Refill  . CINNAMON PO Take 2 tablets by mouth every morning.     Marland Kitchen  Cyanocobalamin 1000 MCG SUBL Place 1 tablet (1,000 mcg total) under the tongue daily. 14 tablet 0  . dexamethasone (DECADRON) 4 MG tablet Take 1 tab two times a day the day before Alimta chemo. Take 2 tabs two times a day starting the day after  chemo for 3 days. 30 tablet 1  . enoxaparin (LOVENOX) 80 MG/0.8ML injection Inject 0.8 mLs (80 mg total) into the skin every 12 (twelve) hours. 48 mL 0  . feeding supplement, ENSURE ENLIVE, (ENSURE ENLIVE) LIQD Take 237 mLs by mouth 2 (two) times daily between meals. 630 mL 12  . folic acid (FOLVITE) 1 MG tablet Take 1 tablet (1 mg total) by mouth daily. Start 5-7 days before Alimta chemotherapy. Continue until 21 days after Alimta completed. 100 tablet 3  . Multiple Vitamin (MULTIVITAMIN WITH MINERALS) TABS tablet Take 1 tablet by mouth every morning.     . Multiple Vitamins-Minerals (ICAPS) CAPS Take 1 capsule by mouth 2 (two) times daily.    . Omega-3 Fatty Acids (FISH OIL PO) Take 1 capsule by mouth every morning.     Marland Kitchen omeprazole (PRILOSEC) 20 MG capsule Take 1 capsule (20 mg total) by mouth daily. 30 capsule 0  . ondansetron (ZOFRAN) 8 MG tablet Take 1 tablet (8 mg total) by mouth 2 (two) times daily as needed for refractory nausea / vomiting. Start on day 3 after chemo. 30 tablet 1  . oxyCODONE (OXYCONTIN) 10 mg 12 hr tablet Take 1 tablet (10 mg total) by mouth every 12 (twelve) hours. 60 tablet 0  . oxyCODONE (ROXICODONE) 5 MG immediate release tablet Take 1-2 tablets (5-10 mg total) by mouth every 4 (four) hours as needed for moderate pain, severe pain or breakthrough pain. 60 tablet 0  . polyethylene glycol (MIRALAX) packet Take 17 g by mouth daily. 30 each 1  . prochlorperazine (COMPAZINE) 10 MG tablet Take 1 tablet (10 mg total) by mouth every 6 (six) hours as needed for nausea or vomiting. 30 tablet 0  . senna-docusate (SENOKOT-S) 8.6-50 MG tablet Take 2 tablets by mouth 2 (two) times daily. 60 tablet 2   No current facility-administered medications for this encounter.     REVIEW OF SYSTEMS:  A 10 point review of systems is documented in the electronic medical record. This was obtained by the nursing staff. However, I reviewed this with the patient to discuss relevant findings and make  appropriate changes.     PHYSICAL EXAM:  height is '6\' 1"'$  (1.854 m) and weight is 190 lb (86.2 kg). His oral temperature is 98.3 F (36.8 C). His blood pressure is 141/60 (abnormal) and his pulse is 114 (abnormal). His oxygen saturation is 95%.    General: Alert and oriented, in no acute distress, accompanied by wife HEENT: Head is normocephalic. Extraocular movements are intact. Oropharynx is clear. Neck: Neck is supple, no palpable cervical or supraclavicular lymphadenopathy. Heart: Regular in rate and rhythm with no murmurs, rubs, or gallops. Chest: Clear to auscultation bilaterally, with no rhonchi, wheezes, or rales. Abdomen: Soft, nontender, nondistended, with no rigidity or guarding. Extremities: No cyanosis. Edema in the right arm. Pitting edema in the lower extremities. Support stockings in place Lymphatics: see Neck Exam Skin: No concerning lesions. Musculoskeletal: symmetric strength and muscle tone throughout except severe limitation in the right arm movement in light of his pain. Neurologic: Cranial nerves II through XII are grossly intact. No obvious focalities. Speech is fluent. Coordination is intact. Psychiatric: Judgment and insight are intact. Affect is appropriate.  ECOG = 2  LABORATORY DATA:  Lab Results  Component Value Date   WBC 13.8 (H) 03/27/2016   HGB 9.2 (L) 03/27/2016   HCT 28.6 (L) 03/27/2016   MCV 90.8 03/27/2016   PLT 532 (H) 03/27/2016   NEUTROABS 10.1 (H) 03/27/2016   Lab Results  Component Value Date   NA 136 03/27/2016   K 4.2 03/27/2016   CL 101 03/19/2016   CO2 22 03/27/2016   GLUCOSE 112 03/27/2016   CREATININE 1.4 (H) 03/27/2016   CALCIUM 9.1 03/27/2016      RADIOGRAPHY: Dg Chest 2 View  Result Date: 03/09/2016 CLINICAL DATA:  Shortness of Breath, lung cancer EXAM: CHEST  2 VIEW COMPARISON:  03/05/2016 FINDINGS: Cardiomediastinal silhouette is stable. Persistent left hilar prominence. Persistent left perihilar interstitial  prominence probable interstitial tumor spread or pneumonitis. Peripheral consolidation in left upper lobe is stable. Mild hyperinflation. Right lung is clear. Stable sclerotic lesion in right fourth rib. IMPRESSION: Persistent left hilar prominence. Persistent left perihilar interstitial prominence probable interstitial tumor spread or pneumonitis. Peripheral consolidation in left upper lobe is stable. Mild hyperinflation. Right lung is clear. Electronically Signed   By: Lahoma Crocker M.D.   On: 03/09/2016 15:21   Mr Jeri Cos KN Contrast  Addendum Date: 03/19/2016   ADDENDUM REPORT: 03/19/2016 11:38 ADDENDUM: This study was reviewed and two subtle brain metastases are suspected. The larger is 3-4 mm in the left hippocampus (series 11, image 25 and series 12, image 16) with minimal associated hippocampal T2 hyperintensity. This is in proximity to physiologic enhancement of the left choroid plexus. No associated mass effect. A second punctate metastasis is suspected in the right occipital lobe white matter (series 11, image 29). CONCLUSION: Positive for early metastatic disease to the brain. This was discussed by telephone with Dr. Florencia Reasons on 03/19/2016 at 0900 hours today, and also with Oncologist Dr. Irene Limbo at 1127 hours. Electronically Signed   By: Genevie Ann M.D.   On: 03/19/2016 11:38   Result Date: 03/19/2016 CLINICAL DATA:  Metastatic disease.  Brain staging. EXAM: MRI HEAD WITHOUT AND WITH CONTRAST TECHNIQUE: Multiplanar, multiecho pulse sequences of the brain and surrounding structures were obtained without and with intravenous contrast. CONTRAST:  42m MULTIHANCE GADOBENATE DIMEGLUMINE 529 MG/ML IV SOLN COMPARISON:  None. FINDINGS: Brain: No abnormal intracranial enhancement to suggest metastatic disease. The right frontal extra-axial low-density appearance on previous PET-CT reflects a small hygroma without significant mass effect. Small hygroma also present around the right cerebellum. Age congruent brain  volume and white matter appearance. No hydrocephalus. Vascular: Normal flow voids. Skull and upper cervical spine: No evidence of metastasis Sinuses/Orbits: Bilateral cataract resection. IMPRESSION: Negative for intracranial metastasis. The right frontal finding on previous PET-CT is a small hygroma. Electronically Signed: By: JMonte FantasiaM.D. On: 03/10/2016 11:15   Nm Pet Image Initial (pi) Skull Base To Thigh  Result Date: 03/05/2016 CLINICAL DATA:  Initial treatment strategy for lung nodule. EXAM: NUCLEAR MEDICINE PET SKULL BASE TO THIGH TECHNIQUE: 12.3 mCi F-18 FDG was injected intravenously. Full-ring PET imaging was performed from the skull base to thigh after the radiotracer. CT data was obtained and used for attenuation correction and anatomic localization. COMPARISON:  Multiple exams, including 02/29/2016 FINDINGS: NECK Please note that inadvertently the entire head was included. There is asymmetric hypodensity along the right frontal subdural space which could be from subdural hygroma or chronic subdural hematoma, MRI of the brain with and without contrast should be considered for further assessment. I do  not see a large mass in the brain on the PET images. There is pathologic supraclavicular adenopathy with a 9 mm left supraclavicular node on image 78 78/4 having a maximum standard uptake value of 10.1 and a cluster of small right supraclavicular nodes having a maximum standard uptake value of 8.5 CHEST There is pathologic upper paratracheal, lower paratracheal, AP window, prevascular, and subcarinal hypermetabolic adenopathy in addition to left hilar and infrahilar adenopathy in the chest. Index AP window lymph node 1.5 cm in short axis with maximum standard uptake value 25.5. Subcarinal lymph node 2.0 cm in short axis with maximum standard uptake value 19.0. There is patchy airspace opacity with nodularity in the left upper lobe which has significantly hypermetabolic components, maximum  standard uptake value 24.0 faintly hypermetabolic activity in the left lower lobe, from inflammation or possibly lymphangitic spread. Trace left pleural effusion, nonspecific for malignancy. Severe emphysema. Coronary, aortic arch, and branch vessel atherosclerotic vascular disease. ABDOMEN/PELVIS The left adrenal gland primarily has low density characteristics typical for adenoma, with central density measurement of 1 Hounsfield unit. However, there is marginal high activity along the left side of the adrenal gland with maximum SUV up to 8.7, raise the possibility of a collision lesion with early metastatic disease to the left adrenal gland. Within the prostate gland, there is a central focus of high activity with maximum SUV of 19.15. All this could possibly be from prostatic urethral urine activity rather than tumor, there is also a left anterior prostate base focus of accentuated activity with maximum and standard uptake value of 17.7. Appearance concerning for prostate malignancy. Suspected left scrotal hydrocele. Bilateral renal cysts. Sigmoid colon diverticulosis. SKELETON 5 cm metastatic lesion in the right proximal humerus mainly involving the humeral head, maximum SUV 49.6. Lucent metastatic lesion of the left T10 vertebra, maximum SUV 36.7. IMPRESSION: 1. Primarily left upper lobe infiltrative hypermetabolic mass with extensive mediastinal and left hilar hypermetabolic adenopathy, osseous metastatic lesions to the right humeral head and left T10 vertebra, and possible collision metastatic lesion of the left adrenal gland with an adrenal adenoma. 2. Central and left anterior base hypermetabolic activity in the prostate gland, concerning for prostate cancer. 3. Asymmetric extra-axial hypodensity along the right frontal lobe, possibly a chronic subdural hematoma or cystic hygroma, under the circumstances I recommend MRI of the brain with and without contrast for further workup and to assess for intracranial  metastatic disease. Other imaging findings of potential clinical significance: Severe emphysema. Coronary, aortic arch, and branch vessel atherosclerotic vascular disease. Aortoiliac atherosclerotic vascular disease. Bilateral renal cysts. Sigmoid colon diverticulosis. Left scrotal hydrocele. Electronically Signed   By: Van Clines M.D.   On: 03/05/2016 15:34   US Biopsy  Result Date: 03/12/2016 INDICATION: 81 year old with an infiltrative left upper lobe lesion. Concern for metastatic lung cancer and tissue diagnosis is needed. There are hypermetabolic supraclavicular lymph nodes. EXAM: ULTRASOUND-GUIDED CORE BIOPSIES AND FINE-NEEDLE ASPIRATIONS OF RIGHT SUPRACLAVICULAR LYMPH NODE MEDICATIONS: None. ANESTHESIA/SEDATION: Moderate (conscious) sedation was employed during this procedure. A total of Versed 1.0 mg and Fentanyl 50 mcg was administered intravenously. Moderate Sedation Time: 21 minutes. The patient's level of consciousness and vital signs were monitored continuously by radiology nursing throughout the procedure under my direct supervision. FLUOROSCOPY TIME:  None COMPLICATIONS: None immediate. PROCEDURE: Informed written consent was obtained from the patient after a thorough discussion of the procedural risks, benefits and alternatives. All questions were addressed. A timeout was performed prior to the initiation of the procedure. Both sides of the  neck evaluated with ultrasound. Small bilateral supraclavicular lymph nodes were identified. Right supraclavicular lymph node adjacent to the right internal jugular vein was felt to be most amendable to biopsy. Right side of the neck was prepped with chlorhexidine. Skin was anesthetized with 1% lidocaine. A total of 6 ultrasound-guided fine-needle aspirations were obtained within this lymph node with 25 gauge needles. Following the fine-needle aspirations, 3 core biopsies were obtained with a 20 gauge core device using ultrasound guidance. Specimens  placed in formalin. Bandage placed over the puncture site. FINDINGS: Small bilateral supraclavicular lymph nodes. A round hypoechoic lymph node just lateral to the right internal jugular vein was sampled. This node measures roughly 1 cm in the short axis. Biopsy needles confirmed within the lesion. IMPRESSION: Ultrasound-guided core biopsies and fine-needle aspirations of right supraclavicular lymph node. Electronically Signed   By: Markus Daft M.D.   On: 03/12/2016 13:15      IMPRESSION: Stage IV adenocarcinoma of the lung. Patient is quite symptomatic from osseous metastasis involving right proximal humerus. He would be a good candidate for short course of palliative radiation therapy directed at the right proximal humerus. I discussed course of treatment, side effects, and toxicities of the course of treatment. He appears to understand and wishes to proceed with treatment. Consent form signed and placed in patients chart.  PLAN: Simulation and treatment planning scheduled for 9:00 am on 04/04/16. Treatment to begin November 20th. I anticipate between 5 and 10 treatments.  I spent 60 minutes minutes face to face with the patient and more than 50% of that time was spent in counseling and/or coordination of care.   ------------------------------------------------  Blair Promise, PhD, MD  This document serves as a record of services personally performed by Gery Pray, MD. It was created on his behalf by Bethann Humble, a trained medical scribe. The creation of this record is based on the scribe's personal observations and the provider's statements to them. This document has been checked and approved by the attending provider.

## 2016-04-03 NOTE — Progress Notes (Signed)
Please see the Nurse Progress Note in the MD Initial Consult Encounter for this patient. 

## 2016-04-04 ENCOUNTER — Ambulatory Visit
Admission: RE | Admit: 2016-04-04 | Discharge: 2016-04-04 | Disposition: A | Discharge status: Home / Self Care | Payer: Medicare Other | Source: Ambulatory Visit | Attending: Radiation Oncology | Admitting: Radiation Oncology

## 2016-04-04 DIAGNOSIS — M25511 Pain in right shoulder: Secondary | ICD-10-CM | POA: Diagnosis not present

## 2016-04-04 DIAGNOSIS — Z79899 Other long term (current) drug therapy: Secondary | ICD-10-CM | POA: Diagnosis not present

## 2016-04-04 DIAGNOSIS — C3490 Malignant neoplasm of unspecified part of unspecified bronchus or lung: Secondary | ICD-10-CM | POA: Diagnosis not present

## 2016-04-04 DIAGNOSIS — Z510 Encounter for antineoplastic radiation therapy: Secondary | ICD-10-CM | POA: Diagnosis not present

## 2016-04-04 DIAGNOSIS — C3492 Malignant neoplasm of unspecified part of left bronchus or lung: Secondary | ICD-10-CM

## 2016-04-04 DIAGNOSIS — C7951 Secondary malignant neoplasm of bone: Secondary | ICD-10-CM | POA: Diagnosis not present

## 2016-04-04 DIAGNOSIS — M109 Gout, unspecified: Secondary | ICD-10-CM | POA: Diagnosis not present

## 2016-04-05 NOTE — Progress Notes (Signed)
  Radiation Oncology         [phone] ________________________________  Name: KRU ALLMAN MRN: [id number]  Date: 04/04/2016  DOB: 05/10/1935  SIMULATION AND TREATMENT PLANNING NOTE    ICD-9-CM ICD-10-CM   1. Adenocarcinoma of left lung, stage 4 (HCC) 162.9 C34.92     DIAGNOSIS:  Stage IV adenocarcinoma of the lung  NARRATIVE:  The patient was brought to the [facility].  Identity was confirmed.  All relevant records and images related to the planned course of therapy were reviewed.  The patient freely provided informed written consent to proceed with treatment after reviewing the details related to the planned course of therapy. The consent form was witnessed and verified by the simulation staff.  Then, the patient was set-up in a stable reproducible  supine position for radiation therapy.  CT images were obtained.  Surface markings were placed.  The CT images were loaded into the planning software.  Then the target and avoidance structures were contoured.  Treatment planning then occurred.  The radiation prescription was entered and confirmed.  Then, I designed and supervised the construction of a total of 3 medically necessary complex treatment devices.  I have requested : Isodose Plan.  I have ordered:dose calc.  PLAN:  The patient will receive 30 Gy in 10 fractions.  -----------------------------------  Blair Promise, PhD, MD

## 2016-04-06 DIAGNOSIS — C7951 Secondary malignant neoplasm of bone: Secondary | ICD-10-CM | POA: Diagnosis not present

## 2016-04-06 DIAGNOSIS — M25511 Pain in right shoulder: Secondary | ICD-10-CM | POA: Diagnosis not present

## 2016-04-06 DIAGNOSIS — Z510 Encounter for antineoplastic radiation therapy: Secondary | ICD-10-CM | POA: Diagnosis not present

## 2016-04-06 DIAGNOSIS — M109 Gout, unspecified: Secondary | ICD-10-CM | POA: Diagnosis not present

## 2016-04-06 DIAGNOSIS — C3490 Malignant neoplasm of unspecified part of unspecified bronchus or lung: Secondary | ICD-10-CM | POA: Diagnosis not present

## 2016-04-06 DIAGNOSIS — Z79899 Other long term (current) drug therapy: Secondary | ICD-10-CM | POA: Diagnosis not present

## 2016-04-07 ENCOUNTER — Ambulatory Visit
Admission: RE | Admit: 2016-04-07 | Discharge: 2016-04-07 | Disposition: A | Discharge status: Home / Self Care | Payer: Medicare Other | Source: Ambulatory Visit | Attending: Radiation Oncology | Admitting: Radiation Oncology

## 2016-04-07 DIAGNOSIS — C7951 Secondary malignant neoplasm of bone: Secondary | ICD-10-CM | POA: Diagnosis not present

## 2016-04-07 DIAGNOSIS — C3490 Malignant neoplasm of unspecified part of unspecified bronchus or lung: Secondary | ICD-10-CM | POA: Diagnosis not present

## 2016-04-07 DIAGNOSIS — M25511 Pain in right shoulder: Secondary | ICD-10-CM | POA: Diagnosis not present

## 2016-04-07 DIAGNOSIS — Z510 Encounter for antineoplastic radiation therapy: Secondary | ICD-10-CM | POA: Diagnosis not present

## 2016-04-07 DIAGNOSIS — M109 Gout, unspecified: Secondary | ICD-10-CM | POA: Diagnosis not present

## 2016-04-07 DIAGNOSIS — Z79899 Other long term (current) drug therapy: Secondary | ICD-10-CM | POA: Diagnosis not present

## 2016-04-08 ENCOUNTER — Encounter: Payer: Self-pay | Admitting: Radiation Oncology

## 2016-04-08 ENCOUNTER — Ambulatory Visit
Admission: RE | Admit: 2016-04-08 | Discharge: 2016-04-08 | Disposition: A | Discharge status: Home / Self Care | Payer: Medicare Other | Source: Ambulatory Visit | Attending: Radiation Oncology | Admitting: Radiation Oncology

## 2016-04-08 ENCOUNTER — Inpatient Hospital Stay
Admission: RE | Admit: 2016-04-08 | Discharge: 2016-04-08 | Disposition: A | Discharge status: Home / Self Care | Payer: Medicare Other | Source: Ambulatory Visit | Attending: Radiation Oncology | Admitting: Radiation Oncology

## 2016-04-08 VITALS — BP 117/70 | HR 102 | Temp 97.5°F | Ht 73.0 in | Wt 180.0 lb

## 2016-04-08 DIAGNOSIS — C3490 Malignant neoplasm of unspecified part of unspecified bronchus or lung: Secondary | ICD-10-CM | POA: Diagnosis not present

## 2016-04-08 DIAGNOSIS — Z79899 Other long term (current) drug therapy: Secondary | ICD-10-CM | POA: Diagnosis not present

## 2016-04-08 DIAGNOSIS — C7951 Secondary malignant neoplasm of bone: Secondary | ICD-10-CM | POA: Diagnosis not present

## 2016-04-08 DIAGNOSIS — Z510 Encounter for antineoplastic radiation therapy: Secondary | ICD-10-CM | POA: Diagnosis not present

## 2016-04-08 DIAGNOSIS — M25511 Pain in right shoulder: Secondary | ICD-10-CM | POA: Diagnosis not present

## 2016-04-08 DIAGNOSIS — M109 Gout, unspecified: Secondary | ICD-10-CM | POA: Diagnosis not present

## 2016-04-08 NOTE — Progress Notes (Signed)
  Radiation Oncology         [phone] ________________________________  Name: Tim Ross MRN: [id number]  Date: 04/08/2016  DOB: 1934/12/11  Weekly Radiation Therapy Management    ICD-9-CM ICD-10-CM   1. Bone metastases (HCC) 198.5 C79.51      Current Dose: 6 Gy     Planned Dose:  30 Gy  Narrative . . . . . . . . The patient presents for routine under treatment assessment.  The patient has completed 2 fractions to his right humerus. He reports pain in his right arm, 2/10 in severity. He reports taking Oxycontin 10 mg every 12 hours and oxycodone 5 mg 1-2 times a day for breakthrough pain. The patient denies fatigue.                                   The patient is without complaint.                                 Set-up films were reviewed.                                 The chart was checked. Physical Findings. . .  height is '6\' 1"'$  (1.854 m) and weight is 180 lb (81.6 kg). His oral temperature is 97.5 F (36.4 C). His blood pressure is 117/70 and his pulse is 102 (abnormal). His oxygen saturation is 99%.  Weight essentially stable.  No significant changes. Cardiopulmonary assessment is negative for acute distress and he exhibits normal effort.   Impression . . . . . . . The patient is tolerating radiation. Plan . . . . . . . . . . . . Continue treatment as planned.  ________________________________   Blair Promise, PhD, MD  This document serves as a record of services personally performed by Gery Pray, MD. It was created on his behalf by Maryla Morrow, a trained medical scribe. The creation of this record is based on the scribe's personal observations and the provider's statements to them. This document has been checked and approved by the attending provider.

## 2016-04-08 NOTE — Progress Notes (Signed)
Tim Ross has completed 2 fractions to his right humerus.  He reports having pain at a 2/10 in his right arm.  He is taking OxyContin 10 mg q 12 hours and oxycodone 5 mg 1-2 times a day for breakthrough pain.  He denies having fatigue.  BP 117/70 (BP Location: Left Arm, Patient Position: Sitting)   Pulse (!) 102   Temp 97.5 F (36.4 C) (Oral)   Ht '6\' 1"'$  (1.854 m)   Wt 180 lb (81.6 kg)   SpO2 99%   BMI 23.75 kg/m    Wt Readings from Last 3 Encounters:  04/08/16 180 lb (81.6 kg)  04/03/16 190 lb (86.2 kg)  03/27/16 188 lb 1.6 oz (85.3 kg)

## 2016-04-08 NOTE — Progress Notes (Signed)
Pt here for patient teaching.  Pt given Radiation and You booklet. Reviewed areas of pertinence such as fatigue and skin changes . Pt able to give teach back of to pat skin and use unscented/gentle soap,avoid applying anything to skin within 4 hours of treatment. Pt demonstrated understanding and verbalizes understanding of information given and will contact nursing with any questions or concerns.

## 2016-04-09 ENCOUNTER — Ambulatory Visit
Admission: RE | Admit: 2016-04-09 | Discharge: 2016-04-09 | Disposition: A | Discharge status: Home / Self Care | Payer: Medicare Other | Source: Ambulatory Visit | Attending: Radiation Oncology | Admitting: Radiation Oncology

## 2016-04-09 DIAGNOSIS — C7951 Secondary malignant neoplasm of bone: Secondary | ICD-10-CM | POA: Diagnosis not present

## 2016-04-09 DIAGNOSIS — C3490 Malignant neoplasm of unspecified part of unspecified bronchus or lung: Secondary | ICD-10-CM | POA: Diagnosis not present

## 2016-04-09 DIAGNOSIS — M109 Gout, unspecified: Secondary | ICD-10-CM | POA: Diagnosis not present

## 2016-04-09 DIAGNOSIS — Z79899 Other long term (current) drug therapy: Secondary | ICD-10-CM | POA: Diagnosis not present

## 2016-04-09 DIAGNOSIS — Z510 Encounter for antineoplastic radiation therapy: Secondary | ICD-10-CM | POA: Diagnosis not present

## 2016-04-09 DIAGNOSIS — M25511 Pain in right shoulder: Secondary | ICD-10-CM | POA: Diagnosis not present

## 2016-04-11 ENCOUNTER — Other Ambulatory Visit: Payer: Self-pay | Admitting: *Deleted

## 2016-04-11 DIAGNOSIS — C3490 Malignant neoplasm of unspecified part of unspecified bronchus or lung: Secondary | ICD-10-CM

## 2016-04-14 ENCOUNTER — Other Ambulatory Visit: Payer: Self-pay | Admitting: Hematology

## 2016-04-14 ENCOUNTER — Ambulatory Visit
Admission: RE | Admit: 2016-04-14 | Discharge: 2016-04-14 | Disposition: A | Discharge status: Home / Self Care | Payer: Medicare Other | Source: Ambulatory Visit | Attending: Radiation Oncology | Admitting: Radiation Oncology

## 2016-04-14 ENCOUNTER — Ambulatory Visit (HOSPITAL_COMMUNITY)
Admission: RE | Admit: 2016-04-14 | Discharge: 2016-04-14 | Disposition: A | Discharge status: Home / Self Care | Payer: Medicare Other | Source: Ambulatory Visit | Attending: Hematology | Admitting: Hematology

## 2016-04-14 ENCOUNTER — Telehealth: Payer: Self-pay | Admitting: Hematology

## 2016-04-14 ENCOUNTER — Other Ambulatory Visit (HOSPITAL_BASED_OUTPATIENT_CLINIC_OR_DEPARTMENT_OTHER): Payer: Medicare Other

## 2016-04-14 ENCOUNTER — Ambulatory Visit (HOSPITAL_BASED_OUTPATIENT_CLINIC_OR_DEPARTMENT_OTHER): Payer: Medicare Other | Admitting: Hematology

## 2016-04-14 ENCOUNTER — Encounter: Payer: Self-pay | Admitting: Hematology

## 2016-04-14 VITALS — BP 119/62 | HR 117 | Temp 97.8°F | Resp 18 | Ht 73.0 in | Wt 175.8 lb

## 2016-04-14 DIAGNOSIS — Z510 Encounter for antineoplastic radiation therapy: Secondary | ICD-10-CM | POA: Diagnosis not present

## 2016-04-14 DIAGNOSIS — C7951 Secondary malignant neoplasm of bone: Secondary | ICD-10-CM

## 2016-04-14 DIAGNOSIS — C7970 Secondary malignant neoplasm of unspecified adrenal gland: Secondary | ICD-10-CM | POA: Diagnosis not present

## 2016-04-14 DIAGNOSIS — E46 Unspecified protein-calorie malnutrition: Secondary | ICD-10-CM | POA: Diagnosis not present

## 2016-04-14 DIAGNOSIS — R5383 Other fatigue: Secondary | ICD-10-CM | POA: Diagnosis not present

## 2016-04-14 DIAGNOSIS — E440 Moderate protein-calorie malnutrition: Secondary | ICD-10-CM

## 2016-04-14 DIAGNOSIS — F329 Major depressive disorder, single episode, unspecified: Secondary | ICD-10-CM

## 2016-04-14 DIAGNOSIS — D649 Anemia, unspecified: Secondary | ICD-10-CM

## 2016-04-14 DIAGNOSIS — I2699 Other pulmonary embolism without acute cor pulmonale: Secondary | ICD-10-CM

## 2016-04-14 DIAGNOSIS — C3490 Malignant neoplasm of unspecified part of unspecified bronchus or lung: Secondary | ICD-10-CM

## 2016-04-14 DIAGNOSIS — G893 Neoplasm related pain (acute) (chronic): Secondary | ICD-10-CM | POA: Diagnosis not present

## 2016-04-14 DIAGNOSIS — C7931 Secondary malignant neoplasm of brain: Secondary | ICD-10-CM

## 2016-04-14 DIAGNOSIS — M25511 Pain in right shoulder: Secondary | ICD-10-CM | POA: Diagnosis not present

## 2016-04-14 DIAGNOSIS — M109 Gout, unspecified: Secondary | ICD-10-CM | POA: Diagnosis not present

## 2016-04-14 DIAGNOSIS — D701 Agranulocytosis secondary to cancer chemotherapy: Secondary | ICD-10-CM | POA: Diagnosis not present

## 2016-04-14 DIAGNOSIS — Z79899 Other long term (current) drug therapy: Secondary | ICD-10-CM | POA: Diagnosis not present

## 2016-04-14 DIAGNOSIS — C3492 Malignant neoplasm of unspecified part of left bronchus or lung: Secondary | ICD-10-CM

## 2016-04-14 LAB — CBC & DIFF AND RETIC
BASO%: 0 % (ref 0.0–2.0)
BASOS ABS: 0 10*3/uL (ref 0.0–0.1)
EOS ABS: 0 10*3/uL (ref 0.0–0.5)
EOS%: 1.3 % (ref 0.0–7.0)
HEMATOCRIT: 27.7 % — AB (ref 38.4–49.9)
HGB: 8.8 g/dL — ABNORMAL LOW (ref 13.0–17.1)
IMMATURE RETIC FRACT: 15.6 % — AB (ref 3.00–10.60)
LYMPH#: 1.1 10*3/uL (ref 0.9–3.3)
LYMPH%: 47.8 % (ref 14.0–49.0)
MCH: 28.1 pg (ref 27.2–33.4)
MCHC: 31.8 g/dL — AB (ref 32.0–36.0)
MCV: 88.5 fL (ref 79.3–98.0)
MONO#: 0.3 10*3/uL (ref 0.1–0.9)
MONO%: 13.2 % (ref 0.0–14.0)
NEUT#: 0.9 10*3/uL — ABNORMAL LOW (ref 1.5–6.5)
NEUT%: 37.7 % — ABNORMAL LOW (ref 39.0–75.0)
Platelets: 125 10*3/uL — ABNORMAL LOW (ref 140–400)
RBC: 3.13 10*6/uL — AB (ref 4.20–5.82)
RDW: 14.9 % — AB (ref 11.0–14.6)
RETIC %: 0.71 % — AB (ref 0.80–1.80)
RETIC CT ABS: 22.22 10*3/uL — AB (ref 34.80–93.90)
WBC: 2.3 10*3/uL — ABNORMAL LOW (ref 4.0–10.3)

## 2016-04-14 LAB — COMPREHENSIVE METABOLIC PANEL
ALT: 33 U/L (ref 0–55)
AST: 20 U/L (ref 5–34)
Albumin: 2.1 g/dL — ABNORMAL LOW (ref 3.5–5.0)
Alkaline Phosphatase: 82 U/L (ref 40–150)
Anion Gap: 9 mEq/L (ref 3–11)
BUN: 13.6 mg/dL (ref 7.0–26.0)
CHLORIDE: 105 meq/L (ref 98–109)
CO2: 20 meq/L — AB (ref 22–29)
CREATININE: 1.2 mg/dL (ref 0.7–1.3)
Calcium: 8.2 mg/dL — ABNORMAL LOW (ref 8.4–10.4)
EGFR: 56 mL/min/{1.73_m2} — ABNORMAL LOW (ref >90)
GLUCOSE: 133 mg/dL (ref 70–140)
Potassium: 5.4 mEq/L — ABNORMAL HIGH (ref 3.5–5.1)
SODIUM: 134 meq/L — AB (ref 136–145)
Total Bilirubin: 0.33 mg/dL (ref 0.20–1.20)
Total Protein: 6.6 g/dL (ref 6.4–8.3)

## 2016-04-14 LAB — IRON AND TIBC
%SAT: 9 % — AB (ref 20–55)
Iron: 19 ug/dL — ABNORMAL LOW (ref 42–163)
TIBC: 206 ug/dL (ref 202–409)
UIBC: 187 ug/dL (ref 117–376)

## 2016-04-14 LAB — FERRITIN

## 2016-04-14 MED ORDER — MIRTAZAPINE 15 MG PO TABS: 15.0000 mg | ORAL_TABLET | Freq: Every day | ORAL | 1 refills | Status: DC

## 2016-04-14 NOTE — Patient Instructions (Signed)
-  We shall set up a blood transfusion this week. -Followed for chemotherapy as per schedule -Given a referral to nutrition therapy -Return to care with Dr. Irene Limbo in 3 weeks. -A prescription for Remeron has been sent to your pharmacy -You could hold off on the OxyContin dose in the morning to help reduce some sluggishness during the day. If your right arm pain is controlled you would also consider holding off OxyContin nighttime dose and use oxycodone short acting as needed for breakthrough pain.

## 2016-04-14 NOTE — Telephone Encounter (Signed)
Gave wife avs report and appointments for November and December. Patient to have PRBC's at [facility] on 11/29 and lab for PRBC's on 11/28 prior to xrt.

## 2016-04-15 ENCOUNTER — Encounter: Payer: Self-pay | Admitting: Radiation Oncology

## 2016-04-15 ENCOUNTER — Ambulatory Visit
Admission: RE | Admit: 2016-04-15 | Discharge: 2016-04-15 | Disposition: A | Discharge status: Home / Self Care | Payer: Medicare Other | Source: Ambulatory Visit | Attending: Radiation Oncology | Admitting: Radiation Oncology

## 2016-04-15 ENCOUNTER — Other Ambulatory Visit: Payer: Medicare Other

## 2016-04-15 DIAGNOSIS — M25511 Pain in right shoulder: Secondary | ICD-10-CM | POA: Diagnosis not present

## 2016-04-15 DIAGNOSIS — C7951 Secondary malignant neoplasm of bone: Secondary | ICD-10-CM | POA: Diagnosis not present

## 2016-04-15 DIAGNOSIS — Z510 Encounter for antineoplastic radiation therapy: Secondary | ICD-10-CM | POA: Diagnosis not present

## 2016-04-15 DIAGNOSIS — Z79899 Other long term (current) drug therapy: Secondary | ICD-10-CM | POA: Diagnosis not present

## 2016-04-15 DIAGNOSIS — M109 Gout, unspecified: Secondary | ICD-10-CM | POA: Diagnosis not present

## 2016-04-15 DIAGNOSIS — C3490 Malignant neoplasm of unspecified part of unspecified bronchus or lung: Secondary | ICD-10-CM | POA: Diagnosis not present

## 2016-04-16 ENCOUNTER — Ambulatory Visit
Admission: RE | Admit: 2016-04-16 | Discharge: 2016-04-16 | Disposition: A | Discharge status: Home / Self Care | Payer: Medicare Other | Source: Ambulatory Visit | Attending: Radiation Oncology | Admitting: Radiation Oncology

## 2016-04-16 ENCOUNTER — Ambulatory Visit (HOSPITAL_COMMUNITY)
Admission: RE | Admit: 2016-04-16 | Discharge: 2016-04-16 | Disposition: A | Discharge status: Home / Self Care | Payer: Medicare Other | Source: Ambulatory Visit | Attending: Hematology | Admitting: Hematology

## 2016-04-16 DIAGNOSIS — M109 Gout, unspecified: Secondary | ICD-10-CM | POA: Diagnosis not present

## 2016-04-16 DIAGNOSIS — D649 Anemia, unspecified: Secondary | ICD-10-CM

## 2016-04-16 DIAGNOSIS — Z79899 Other long term (current) drug therapy: Secondary | ICD-10-CM | POA: Diagnosis not present

## 2016-04-16 DIAGNOSIS — C7951 Secondary malignant neoplasm of bone: Secondary | ICD-10-CM | POA: Diagnosis not present

## 2016-04-16 DIAGNOSIS — Z510 Encounter for antineoplastic radiation therapy: Secondary | ICD-10-CM | POA: Diagnosis not present

## 2016-04-16 DIAGNOSIS — M25511 Pain in right shoulder: Secondary | ICD-10-CM | POA: Diagnosis not present

## 2016-04-16 DIAGNOSIS — C3490 Malignant neoplasm of unspecified part of unspecified bronchus or lung: Secondary | ICD-10-CM | POA: Diagnosis not present

## 2016-04-16 LAB — PREPARE RBC (CROSSMATCH)

## 2016-04-16 MED ORDER — SODIUM CHLORIDE 0.9% FLUSH
3.0000 mL | INTRAVENOUS | Status: DC | PRN
Start: 2016-04-16 — End: 2016-04-17

## 2016-04-16 MED ORDER — HEPARIN SOD (PORK) LOCK FLUSH 100 UNIT/ML IV SOLN: 250.0000 [IU] | INTRAVENOUS | Status: DC | PRN

## 2016-04-16 MED ORDER — SODIUM CHLORIDE 0.9 % IV SOLN
250.0000 mL | Freq: Once | INTRAVENOUS | Status: AC
  Administered 2016-04-16: 250 mL via INTRAVENOUS

## 2016-04-16 MED ORDER — DIPHENHYDRAMINE HCL 25 MG PO CAPS
25.0000 mg | ORAL_CAPSULE | Freq: Once | ORAL | Status: AC
  Administered 2016-04-16: 25 mg via ORAL
  Filled 2016-04-16: qty 1

## 2016-04-16 MED ORDER — HEPARIN SOD (PORK) LOCK FLUSH 100 UNIT/ML IV SOLN: 500.0000 [IU] | Freq: Every day | INTRAVENOUS | Status: DC | PRN

## 2016-04-16 MED ORDER — DIPHENHYDRAMINE HCL 50 MG/ML IJ SOLN: 25.0000 mg | Freq: Four times a day (QID) | INTRAMUSCULAR | Status: DC | PRN

## 2016-04-16 MED ORDER — METHYLPREDNISOLONE SODIUM SUCC 125 MG IJ SOLR
40.0000 mg | Freq: Once | INTRAMUSCULAR | Status: AC
  Administered 2016-04-16: 40 mg via INTRAVENOUS
  Filled 2016-04-16: qty 2

## 2016-04-16 MED ORDER — SODIUM CHLORIDE 0.9% FLUSH: 10.0000 mL | INTRAVENOUS | Status: DC | PRN

## 2016-04-16 MED ORDER — ACETAMINOPHEN 325 MG PO TABS
650.0000 mg | ORAL_TABLET | Freq: Once | ORAL | Status: AC
  Administered 2016-04-16: 650 mg via ORAL
  Filled 2016-04-16: qty 2

## 2016-04-16 NOTE — Progress Notes (Signed)
Patient ID: Tim Ross, male   DOB: Oct 10, 1934, 81 y.o.   MRN: [id number] Provider:Gautam Juleen China, MD  Associated Diagnosis: Symptomatic anemia (D64.9)  Procedure: Transfusion of 1 unit PRBC as ordered. Patient tolerated transfusion well. No reaction.  Went over discharge instructions and copy given to patient. Patient alert, oriented and ambulatory at time of discharge. Discharged home with wife.

## 2016-04-16 NOTE — Discharge Instructions (Signed)

## 2016-04-17 ENCOUNTER — Ambulatory Visit: Payer: Medicare Other

## 2016-04-17 ENCOUNTER — Encounter: Payer: Medicare Other | Admitting: Family Medicine

## 2016-04-17 LAB — TYPE AND SCREEN
ABO/RH(D): A NEG
ANTIBODY SCREEN: NEGATIVE
Unit division: 0

## 2016-04-18 ENCOUNTER — Encounter (HOSPITAL_COMMUNITY): Payer: Self-pay

## 2016-04-18 ENCOUNTER — Ambulatory Visit
Admission: RE | Admit: 2016-04-18 | Discharge: 2016-04-18 | Disposition: A | Discharge status: Home / Self Care | Payer: Medicare Other | Source: Ambulatory Visit | Attending: Radiation Oncology | Admitting: Radiation Oncology

## 2016-04-18 ENCOUNTER — Encounter (INDEPENDENT_AMBULATORY_CARE_PROVIDER_SITE_OTHER): Payer: Medicare Other | Admitting: Ophthalmology

## 2016-04-18 DIAGNOSIS — M25511 Pain in right shoulder: Secondary | ICD-10-CM | POA: Diagnosis not present

## 2016-04-18 DIAGNOSIS — Z79899 Other long term (current) drug therapy: Secondary | ICD-10-CM | POA: Diagnosis not present

## 2016-04-18 DIAGNOSIS — Z510 Encounter for antineoplastic radiation therapy: Secondary | ICD-10-CM | POA: Diagnosis not present

## 2016-04-18 DIAGNOSIS — C7951 Secondary malignant neoplasm of bone: Secondary | ICD-10-CM | POA: Diagnosis not present

## 2016-04-18 DIAGNOSIS — C3490 Malignant neoplasm of unspecified part of unspecified bronchus or lung: Secondary | ICD-10-CM | POA: Diagnosis not present

## 2016-04-18 DIAGNOSIS — M109 Gout, unspecified: Secondary | ICD-10-CM | POA: Diagnosis not present

## 2016-04-18 NOTE — Progress Notes (Signed)
Tim Ross  HEMATOLOGY ONCOLOGY CLINIC NOTE  Date of service: .04/14/2016  Patient Care Team: Marin Olp, MD as PCP - General (Family Medicine) Latanya Maudlin, MD as Consulting Physician (Orthopedic Surgery)  CC: Follow-up for metastatic lung cancer  Diagnosis:  1)Metastatic Lung Adenocarcinoma with mets to bone , adrenal glands and concern for early mets to brain. 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 Patient has been referred to [facility] for deep enteroscopy for possible ablation. Has appointment in December 19th 2017    Current Treatment:  -Carboplatin/Alimta (palliative chemotherapy). P Low PDL 1 expression at 20%. EGFR , ALK , ROS 1 and BRAF mutation negative based on [employer] One testing   -Referred to radiation oncology for consideration of palliative radiation to the painful right humeral metastasis -will need to monitor early brain met and consider palliative RT if growing/symptomatic  INTERVAL HISTORY:  Tim Ross is here for his one-week follow-up post for cycle of carboplatin and Alimta chemotherapy. Notes some grade 1-2 fatigue. No nausea vomiting or diarrhea. No symptoms of mucositis. No rashes. Cough improved. No hemoptysis. No overt shortness of breath at rest. Patient reports no new GI bleeding.  Continues to be on Lovenox. Somewhat decreased appetite and food intake . Accompanied by his wife and daughter for this clinic visit . He has an appointment at [facility] for possible deep enteroscopy for small bowel AVM ablation.  he has started radiation to his right humeral metastasis. Pain much improved already with the OxyContin and radiation.    REVIEW OF SYSTEMS:    10 Point review of systems of done and is negative except as noted above.  . Past Medical History:  Diagnosis Date  .  Cancer ([place])   . Colonic polyp   . GERD (gastroesophageal reflux disease)   . Hyperlipidemia   . Hypertension   . Muscular degeneration   . Pneumonia 2002  . Testicular cyst    removed-noncancerous    . Past Surgical History:  Procedure Laterality Date  . CATARACT EXTRACTION     right side  . COLONOSCOPY N/A 03/14/2016   Procedure: COLONOSCOPY;  Surgeon: Doran Stabler, MD;  Location: [facility];  Service: Endoscopy;  Laterality: N/A;  . ESOPHAGOGASTRODUODENOSCOPY N/A 03/11/2016   Procedure: ESOPHAGOGASTRODUODENOSCOPY (EGD);  Surgeon: Doran Stabler, MD;  Location: [facility];  Service: Endoscopy;  Laterality: N/A;  . GIVENS CAPSULE STUDY N/A 03/14/2016   Procedure: GIVENS CAPSULE STUDY;  Surgeon: Doran Stabler, MD;  Location: [facility];  Service: Endoscopy;  Laterality: N/A;  . KNEE SURGERY     scope on both  . LUMBAR LAMINECTOMY    . TRANSURETHRAL RESECTION OF PROSTATE     resolved issues    . Social History  Substance Use Topics  . Smoking status: Former Smoker    Packs/day: 1.50    Years: 40.00    Types: Cigarettes    Quit date: 12/30/1990  . Smokeless tobacco: Never Used  . Alcohol use No    ALLERGIES:  is allergic to celebrex [celecoxib].  MEDICATIONS:  Current Outpatient Prescriptions  Medication Sig Dispense Refill  . CINNAMON PO Take 2 tablets by mouth every morning.     . Cyanocobalamin 1000 MCG SUBL Place 1 tablet (1,000 mcg total) under the tongue daily. 14 tablet 0  .  dexamethasone (DECADRON) 4 MG tablet Take 1 tab two times a day the day before Alimta chemo. Take 2 tabs two times a day starting the day after chemo for 3 days. 30 tablet 1  . enoxaparin (LOVENOX) 80 MG/0.8ML injection Inject 0.8 mLs (80 mg total) into the skin every 12 (twelve) hours. 48 mL 0  . feeding supplement, ENSURE ENLIVE, (ENSURE ENLIVE) LIQD Take 237 mLs by mouth 2 (two) times daily between meals. 875 mL 12  . folic acid (FOLVITE) 1 MG tablet Take 1 tablet (1 mg  total) by mouth daily. Start 5-7 days before Alimta chemotherapy. Continue until 21 days after Alimta completed. 100 tablet 3  . mirtazapine (REMERON) 15 MG tablet Take 1 tablet (15 mg total) by mouth at bedtime. 30 tablet 1  . Multiple Vitamin (MULTIVITAMIN WITH MINERALS) TABS tablet Take 1 tablet by mouth every morning.     . Multiple Vitamins-Minerals (ICAPS) CAPS Take 1 capsule by mouth 2 (two) times daily.    . Omega-3 Fatty Acids (FISH OIL PO) Take 1 capsule by mouth every morning.     Tim Ross omeprazole (PRILOSEC) 20 MG capsule Take 1 capsule (20 mg total) by mouth daily. 30 capsule 0  . ondansetron (ZOFRAN) 8 MG tablet Take 1 tablet (8 mg total) by mouth 2 (two) times daily as needed for refractory nausea / vomiting. Start on day 3 after chemo. 30 tablet 1  . oxyCODONE (OXYCONTIN) 10 mg 12 hr tablet Take 1 tablet (10 mg total) by mouth every 12 (twelve) hours. 60 tablet 0  . oxyCODONE (ROXICODONE) 5 MG immediate release tablet Take 1-2 tablets (5-10 mg total) by mouth every 4 (four) hours as needed for moderate pain, severe pain or breakthrough pain. 60 tablet 0  . polyethylene glycol (MIRALAX) packet Take 17 g by mouth daily. 30 each 1  . prochlorperazine (COMPAZINE) 10 MG tablet Take 1 tablet (10 mg total) by mouth every 6 (six) hours as needed for nausea or vomiting. 30 tablet 0  . senna-docusate (SENOKOT-S) 8.6-50 MG tablet Take 2 tablets by mouth 2 (two) times daily. 60 tablet 2   No current facility-administered medications for this visit.     PHYSICAL EXAMINATION: ECOG PERFORMANCE STATUS: 2 - Symptomatic, <50% confined to bed  . Vitals:   04/14/16 0859  BP: 119/62  Pulse: (!) 117  Resp: 18  Temp: 97.8 F (36.6 C)    Filed Weights   04/14/16 0859  Weight: 175 lb 12.8 oz (79.7 kg)   .Body mass index is 23.19 kg/m.  GENERAL:alert, in no acute distress and comfortable SKIN: skin color, texture, turgor are normal, no rashes or significant lesions EYES: normal, conjunctiva  are pink and non-injected, sclera clear OROPHARYNX:no exudate, no erythema and lips, buccal mucosa, and tongue normal  NECK: supple, no JVD, thyroid normal size, non-tender, without nodularity LYMPH:  no palpable lymphadenopathy in the cervical, axillary or inguinal LUNGS: clear to auscultation with normal respiratory effort HEART: regular rate & rhythm,  no murmurs and no lower extremity edema ABDOMEN: abdomen soft, non-tender, normoactive bowel sounds  Musculoskeletal: no cyanosis of digits and no clubbing  PSYCH: alert & oriented x 3 with fluent speech NEURO: no focal motor/sensory deficits  LABORATORY DATA:   I have reviewed the data as listed  . CBC Latest Ref Rng & Units 04/14/2016 03/27/2016 03/19/2016  WBC 4.0 - 10.3 10e3/uL 2.3(L) 13.8(H) 12.3(H)  Hemoglobin 13.0 - 17.1 g/dL 8.8(L) 9.2(L) 9.2(L)  Hematocrit 38.4 - 49.9 % 27.7(L) 28.6(L)  27.5(L)  Platelets 140 - 400 10e3/uL 125(L) 532(H) 383.0    . CMP Latest Ref Rng & Units 04/14/2016 03/27/2016 03/19/2016  Glucose 70 - 140 mg/dl 133 112 88  BUN 7.0 - 26.0 mg/dL 13.6 26.7(H) 25(H)  Creatinine 0.7 - 1.3 mg/dL 1.2 1.4(H) 1.37  Sodium 136 - 145 mEq/L 134(L) 136 135  Potassium 3.5 - 5.1 mEq/L 5.4 no hemolysis noted(H) 4.2 4.0  Chloride 96 - 112 mEq/L - - 101  CO2 22 - 29 mEq/L 20(L) 22 24  Calcium 8.4 - 10.4 mg/dL 8.2(L) 9.1 8.6  Total Protein 6.4 - 8.3 g/dL 6.6 6.4 -  Total Bilirubin 0.20 - 1.20 mg/dL 0.33 0.25 -  Alkaline Phos 40 - 150 U/L 82 83 -  AST 5 - 34 U/L 20 38(H) -  ALT 0 - 55 U/L 33 53 -         RADIOGRAPHIC STUDIES: I have personally reviewed the radiological images as listed and agreed with the findings in the report. No results found.  ASSESSMENT & PLAN:   81 year old male with  #1 Metastatic Lung Adenocarcinoma  With mets to bones, adrenal gland and possible very subtle early mets to brain. Low PDL 1 expression at 20%. EGFR , ALK , ROS 1 and BRAF mutation negative based on [employer]  testing   #2 painful right humerus metastatic lesion requiring significant pain medications. Plan -No prohibitive toxicities at this time . -Patient mildly neutropenic due to treatment has received Neulasta and expect that his counts will bounce back . -Does have significant grade 2 fatigue . -We shall reduce his carboplatin dose about 15-20% , and continue a lower dose of Alimta . -If he does not tolerate chemotherapy after his second cycle we'll consider switching him to PD1 inhibitor . -No targetable mutation noted on foundation one testing . -Continue follow-up for radiation therapy  to the right shoulder  painful lesion. -We will need to monitor his brain metastases closely with interval imaging and might need to interpret chemotherapy and consider palliative radiation if this becomes symptomatic or shows signs of progression. --We'll need to monitor for anemia. Ferritin level elevated likely due to inflammation from his tumor.  #3 recent GI bleeding due to small bowel AVM. No overt bleeding at this time. Hemoglobin stable. Plan -Would need to closely monitor for bleeding while on anticoagulation for pulmonary embolism. - follow-up at [facility] for consideration of deep enteroscopy to try to ablate his small bowel AVM.  #4 pulmonary embolism related to metastatic malignancy. continue Lovenox   #6  bony metastases #7 adrenal metastases-no evidence of adrenal insufficiency at this time -This is predominantly in his right shoulder due to bony metastases. -continue Xgeva for skeletal metastases -Continue palliative radiation to the right shoulder. - on OxyContin 10 mg by mouth twice daily and continue oxycodone when necessary for breakthrough pain.  #8 protein calorie malnutrition  -Referral to Ernest[facility] Kiel for nutritional consultation . -Encouraged to maintain small frequent meals.  #9 fatigue and depression -Optimize nutrition -Try to ambulate at least 20 minutes daily . -We shall  transfuse him PRBC for symptomatic anemia  -Given a referral to nutrition therapy --A prescription for Remeron has been sent to your pharmacy - will probably help with appetite and depression. -You could hold off on the OxyContin dose in the morning to help reduce some sluggishness during the day. If your right arm pain is controlled you would also consider holding off OxyContin nighttime dose and use oxycodone short  acting as needed for breakthrough pain.  Return to clinic with Dr. Irene Limbo in  2-3 weeks with labs  I spent 30 minutes counseling the patient face to face. The total time spent in the appointment was 40 minutes and more than 50% was on counseling and direct patient cares.    Sullivan Lone MD [place] AAHIVMS [facility] [place] [facility] Hematology/Oncology Physician [facility]  (Office):       [phone] (Work cell):  [phone] (Fax):           [phone]

## 2016-04-21 ENCOUNTER — Encounter: Payer: Medicare Other | Admitting: Radiation Oncology

## 2016-04-21 ENCOUNTER — Ambulatory Visit
Admission: RE | Admit: 2016-04-21 | Discharge: 2016-04-21 | Disposition: A | Discharge status: Home / Self Care | Payer: Medicare Other | Source: Ambulatory Visit | Attending: Radiation Oncology | Admitting: Radiation Oncology

## 2016-04-21 DIAGNOSIS — C7951 Secondary malignant neoplasm of bone: Secondary | ICD-10-CM | POA: Diagnosis not present

## 2016-04-21 DIAGNOSIS — M25511 Pain in right shoulder: Secondary | ICD-10-CM | POA: Diagnosis not present

## 2016-04-21 DIAGNOSIS — Z510 Encounter for antineoplastic radiation therapy: Secondary | ICD-10-CM | POA: Diagnosis not present

## 2016-04-21 DIAGNOSIS — M109 Gout, unspecified: Secondary | ICD-10-CM | POA: Diagnosis not present

## 2016-04-21 DIAGNOSIS — Z79899 Other long term (current) drug therapy: Secondary | ICD-10-CM | POA: Diagnosis not present

## 2016-04-21 DIAGNOSIS — C3490 Malignant neoplasm of unspecified part of unspecified bronchus or lung: Secondary | ICD-10-CM | POA: Diagnosis not present

## 2016-04-22 ENCOUNTER — Ambulatory Visit
Admission: RE | Admit: 2016-04-22 | Discharge: 2016-04-22 | Disposition: A | Discharge status: Home / Self Care | Payer: Medicare Other | Source: Ambulatory Visit | Attending: Radiation Oncology | Admitting: Radiation Oncology

## 2016-04-22 ENCOUNTER — Ambulatory Visit: Payer: Medicare Other | Admitting: Radiation Oncology

## 2016-04-22 ENCOUNTER — Encounter: Payer: Self-pay | Admitting: Radiation Oncology

## 2016-04-22 ENCOUNTER — Ambulatory Visit (HOSPITAL_BASED_OUTPATIENT_CLINIC_OR_DEPARTMENT_OTHER): Payer: Medicare Other

## 2016-04-22 ENCOUNTER — Other Ambulatory Visit: Payer: Self-pay | Admitting: *Deleted

## 2016-04-22 ENCOUNTER — Ambulatory Visit: Payer: Medicare Other

## 2016-04-22 ENCOUNTER — Ambulatory Visit: Payer: Medicare Other | Admitting: Nutrition

## 2016-04-22 VITALS — BP 137/62 | HR 102 | Temp 97.7°F | Resp 18

## 2016-04-22 VITALS — BP 126/66 | HR 109 | Temp 98.2°F | Ht 73.0 in | Wt 176.2 lb

## 2016-04-22 DIAGNOSIS — C7951 Secondary malignant neoplasm of bone: Secondary | ICD-10-CM | POA: Diagnosis not present

## 2016-04-22 DIAGNOSIS — C7970 Secondary malignant neoplasm of unspecified adrenal gland: Secondary | ICD-10-CM

## 2016-04-22 DIAGNOSIS — C3490 Malignant neoplasm of unspecified part of unspecified bronchus or lung: Secondary | ICD-10-CM

## 2016-04-22 DIAGNOSIS — C3492 Malignant neoplasm of unspecified part of left bronchus or lung: Secondary | ICD-10-CM | POA: Diagnosis not present

## 2016-04-22 DIAGNOSIS — Z510 Encounter for antineoplastic radiation therapy: Secondary | ICD-10-CM | POA: Diagnosis not present

## 2016-04-22 DIAGNOSIS — Z79899 Other long term (current) drug therapy: Secondary | ICD-10-CM | POA: Diagnosis not present

## 2016-04-22 DIAGNOSIS — M25511 Pain in right shoulder: Secondary | ICD-10-CM | POA: Diagnosis not present

## 2016-04-22 DIAGNOSIS — C7931 Secondary malignant neoplasm of brain: Secondary | ICD-10-CM

## 2016-04-22 DIAGNOSIS — Z5111 Encounter for antineoplastic chemotherapy: Secondary | ICD-10-CM

## 2016-04-22 DIAGNOSIS — M109 Gout, unspecified: Secondary | ICD-10-CM | POA: Diagnosis not present

## 2016-04-22 LAB — CBC WITH DIFFERENTIAL/PLATELET
BASO%: 0.2 % (ref 0.0–2.0)
Basophils Absolute: 0 10*3/uL (ref 0.0–0.1)
EOS%: 0.2 % (ref 0.0–7.0)
Eosinophils Absolute: 0 10*3/uL (ref 0.0–0.5)
HCT: 30.5 % — ABNORMAL LOW (ref 38.4–49.9)
HEMOGLOBIN: 9.7 g/dL — AB (ref 13.0–17.1)
LYMPH#: 1.6 10*3/uL (ref 0.9–3.3)
LYMPH%: 26.3 % (ref 14.0–49.0)
MCH: 28.5 pg (ref 27.2–33.4)
MCHC: 31.8 g/dL — ABNORMAL LOW (ref 32.0–36.0)
MCV: 89.7 fL (ref 79.3–98.0)
MONO#: 0.7 10*3/uL (ref 0.1–0.9)
MONO%: 11.2 % (ref 0.0–14.0)
NEUT#: 3.7 10*3/uL (ref 1.5–6.5)
NEUT%: 62.1 % (ref 39.0–75.0)
Platelets: 629 10*3/uL — ABNORMAL HIGH (ref 140–400)
RBC: 3.4 10*6/uL — AB (ref 4.20–5.82)
RDW: 16.6 % — AB (ref 11.0–14.6)
WBC: 6 10*3/uL (ref 4.0–10.3)
nRBC: 0 % (ref 0–0)

## 2016-04-22 LAB — COMPREHENSIVE METABOLIC PANEL
ALBUMIN: 2.4 g/dL — AB (ref 3.5–5.0)
ALT: 28 U/L (ref 0–55)
AST: 17 U/L (ref 5–34)
Alkaline Phosphatase: 83 U/L (ref 40–150)
Anion Gap: 11 mEq/L (ref 3–11)
BUN: 23.6 mg/dL (ref 7.0–26.0)
CHLORIDE: 107 meq/L (ref 98–109)
CO2: 21 meq/L — AB (ref 22–29)
Calcium: 9.2 mg/dL (ref 8.4–10.4)
Creatinine: 1.2 mg/dL (ref 0.7–1.3)
EGFR: 56 mL/min/{1.73_m2} — AB (ref >90)
GLUCOSE: 140 mg/dL (ref 70–140)
POTASSIUM: 4.6 meq/L (ref 3.5–5.1)
SODIUM: 139 meq/L (ref 136–145)
Total Bilirubin: 0.23 mg/dL (ref 0.20–1.20)
Total Protein: 6.7 g/dL (ref 6.4–8.3)

## 2016-04-22 MED ORDER — DEXAMETHASONE SODIUM PHOSPHATE 10 MG/ML IJ SOLN
INTRAMUSCULAR | Status: AC
  Filled 2016-04-22: qty 1

## 2016-04-22 MED ORDER — SODIUM CHLORIDE 0.9 % IV SOLN
335.0000 mg/m2 | Freq: Once | INTRAVENOUS | Status: AC
  Administered 2016-04-22: 700 mg via INTRAVENOUS
  Filled 2016-04-22: qty 20

## 2016-04-22 MED ORDER — PALONOSETRON HCL INJECTION 0.25 MG/5ML
INTRAVENOUS | Status: AC
  Filled 2016-04-22: qty 5

## 2016-04-22 MED ORDER — DEXAMETHASONE SODIUM PHOSPHATE 10 MG/ML IJ SOLN
10.0000 mg | Freq: Once | INTRAMUSCULAR | Status: AC
  Administered 2016-04-22: 10 mg via INTRAVENOUS

## 2016-04-22 MED ORDER — SODIUM CHLORIDE 0.9 % IV SOLN
Freq: Once | INTRAVENOUS | Status: AC
  Administered 2016-04-22: 09:00:00 via INTRAVENOUS

## 2016-04-22 MED ORDER — PALONOSETRON HCL INJECTION 0.25 MG/5ML
0.2500 mg | Freq: Once | INTRAVENOUS | Status: AC
Start: 2016-04-22 — End: 2016-04-22
  Administered 2016-04-22: 0.25 mg via INTRAVENOUS

## 2016-04-22 MED ORDER — SODIUM CHLORIDE 0.9 % IV SOLN
260.0000 mg | Freq: Once | INTRAVENOUS | Status: AC
  Administered 2016-04-22: 260 mg via INTRAVENOUS
  Filled 2016-04-22: qty 26

## 2016-04-22 MED ORDER — SODIUM CHLORIDE 0.9 % IV SOLN
10.0000 mg | Freq: Once | INTRAVENOUS | Status: DC
  Filled 2016-04-22: qty 1

## 2016-04-22 NOTE — Patient Instructions (Signed)
[facility] Discharge Instructions for Patients Receiving Chemotherapy  Today you received the following chemotherapy agents Alimta and Carboplatin.  To help prevent nausea and vomiting after your treatment, we encourage you to take your nausea medication as directed. NO ZOFRAN FOR 3 DAYS.  If you develop nausea and vomiting that is not controlled by your nausea medication, call the clinic.   BELOW ARE SYMPTOMS THAT SHOULD BE REPORTED IMMEDIATELY:  *FEVER GREATER THAN 100.5 F  *CHILLS WITH OR WITHOUT FEVER  NAUSEA AND VOMITING THAT IS NOT CONTROLLED WITH YOUR NAUSEA MEDICATION  *UNUSUAL SHORTNESS OF BREATH  *UNUSUAL BRUISING OR BLEEDING  TENDERNESS IN MOUTH AND THROAT WITH OR WITHOUT PRESENCE OF ULCERS  *URINARY PROBLEMS  *BOWEL PROBLEMS  UNUSUAL RASH Items with * indicate a potential emergency and should be followed up as soon as possible.  Feel free to call the clinic you have any questions or concerns. The clinic phone number is [phone].  Please show the [place] at check-in to the Emergency Department and triage nurse.

## 2016-04-22 NOTE — Progress Notes (Signed)
Tim Ross has completed 9 fractions to his right humerus.  He reports the pain in his right arm is much better.  He is taking oxycodone 5 mg twice a day.  The swelling in his right hand is much better.  He denies having fatigue or skin irritation.  He had chemotherapy today.  He has been given a one month follow up appointment.  BP 126/66 (BP Location: Left Arm, Patient Position: Sitting)   Pulse (!) 109   Temp 98.2 F (36.8 C) (Oral)   Ht '6\' 1"'$  (1.854 m)   Wt 176 lb 3.2 oz (79.9 kg)   SpO2 99%   BMI 23.25 kg/m    Wt Readings from Last 3 Encounters:  04/22/16 176 lb 3.2 oz (79.9 kg)  04/15/16 171 lb 6.4 oz (77.7 kg)  04/14/16 175 lb 12.8 oz (79.7 kg)

## 2016-04-22 NOTE — Progress Notes (Signed)
81 year old male diagnosed with metastatic lung cancer.  He is a patient of Dr. Irene Limbo.  Past medical history includes GERD, hyperlipidemia, hypertension, and macular degeneration.  Medications include cinnamon, vitamin N47, Decadron, folic acid, Remeron, multivitamin, fish oil, Prilosec, Zofran.  Labs include sodium 134, potassium 5.4, and albumin 2.1.  Height: 6 feet 1 inch. Weight: 171.4 pounds November 28. Usual body weight: 200 pounds June 2017.  190 pounds November 16. BMI: 22.61.  Patient has received palliative radiation therapy to his right humerus. He reports taste alterations are the only thing keeping him from eating. Patient denies nausea, vomiting, constipation, diarrhea. He reports he drinks one ensure compact a day.  Nutrition diagnosis: Severe malnutrition related to lung cancer as evidenced by 10% weight loss in less than one month, and less than 75% of energy intake for greater than one month.  Intervention: Patient was educated on strategies for improving taste alterations. Provided fact sheet. Recommended patient change to  ensure Enlive and provided samples and coupons. Recommended patient drink 3 ensure enlive daily  between meals.  Monitoring, evaluation, goals:  Patient will increase oral intake to minimize further weight loss.  Next visit: Patient will contact me for questions or concerns.  **Disclaimer: This note was dictated with voice recognition software. Similar sounding words can inadvertently be transcribed and this note may contain transcription errors which may not have been corrected upon publication of note.**

## 2016-04-22 NOTE — Progress Notes (Signed)
  Radiation Oncology         [phone]) [phone] ________________________________  Name: Tim Ross MRN: [id number]  Date: 04/22/2016  DOB: Dec 31, 1934  Weekly Radiation Therapy Management    ICD-9-CM ICD-10-CM   1. Bone metastases (HCC) 198.5 C79.51      Current Dose: 27 Gy     Planned Dose:  30 Gy  Narrative . . . . . . . . The patient presents for routine under treatment assessment.  The patient has completed 9 fractions to his right humerus. He reports the pain in his right arm is much better today. The patient is taking oxycodone 5 mg twice a day. He denies fatigue or skin irritation. He had chemotherapy today.                                    The patient is without complaint.                                 Set-up films were reviewed.                                 The chart was checked. Physical Findings. . .  height is '6\' 1"'$  (1.854 m) and weight is 176 lb 3.2 oz (79.9 kg). His oral temperature is 98.2 F (36.8 C). His blood pressure is 126/66 and his pulse is 109 (abnormal). His oxygen saturation is 99%.  Weight essentially stable.  No significant changes. Cardiopulmonary assessment is negative for acute distress and he exhibits normal effort. Lungs are clear to auscultation bilaterally. Less swelling in right shoulder area, no significant skin reaction. Impression . . . . . . . The patient is tolerating radiation. Plan . . . . . . . . . . . . Continue final treatment as planned. The patient will return for routine follow up in one month.  ________________________________   Blair Promise, PhD, MD  This document serves as a record of services personally performed by Gery Pray, MD. It was created on his behalf by Maryla Morrow, a trained medical scribe. The creation of this record is based on the scribe's personal observations and the provider's statements to them. This document has been checked and approved by the attending provider.

## 2016-04-23 ENCOUNTER — Other Ambulatory Visit (HOSPITAL_COMMUNITY)
Admission: RE | Admit: 2016-04-23 | Discharge: 2016-04-23 | Disposition: A | Discharge status: Home / Self Care | Payer: Medicare Other | Source: Ambulatory Visit | Attending: Hematology | Admitting: Hematology

## 2016-04-23 ENCOUNTER — Ambulatory Visit: Payer: Medicare Other

## 2016-04-23 ENCOUNTER — Ambulatory Visit
Admission: RE | Admit: 2016-04-23 | Discharge: 2016-04-23 | Disposition: A | Discharge status: Home / Self Care | Payer: Medicare Other | Source: Ambulatory Visit | Attending: Radiation Oncology | Admitting: Radiation Oncology

## 2016-04-23 DIAGNOSIS — Z510 Encounter for antineoplastic radiation therapy: Secondary | ICD-10-CM | POA: Diagnosis not present

## 2016-04-23 DIAGNOSIS — M25511 Pain in right shoulder: Secondary | ICD-10-CM | POA: Diagnosis not present

## 2016-04-23 DIAGNOSIS — C3490 Malignant neoplasm of unspecified part of unspecified bronchus or lung: Secondary | ICD-10-CM | POA: Diagnosis not present

## 2016-04-23 DIAGNOSIS — C3492 Malignant neoplasm of unspecified part of left bronchus or lung: Secondary | ICD-10-CM | POA: Insufficient documentation

## 2016-04-23 DIAGNOSIS — M109 Gout, unspecified: Secondary | ICD-10-CM | POA: Diagnosis not present

## 2016-04-23 DIAGNOSIS — Z79899 Other long term (current) drug therapy: Secondary | ICD-10-CM | POA: Diagnosis not present

## 2016-04-23 DIAGNOSIS — C7951 Secondary malignant neoplasm of bone: Secondary | ICD-10-CM | POA: Diagnosis not present

## 2016-04-25 ENCOUNTER — Other Ambulatory Visit: Payer: Self-pay | Admitting: Hematology

## 2016-04-25 LAB — EPIDERMAL GROWTH FACTOR RECEPTOR (EGFR) MUTATION ANALYSIS

## 2016-04-28 ENCOUNTER — Other Ambulatory Visit: Payer: Self-pay | Admitting: *Deleted

## 2016-04-28 MED ORDER — ENOXAPARIN SODIUM 80 MG/0.8ML ~~LOC~~ SOLN: 80.0000 mg | Freq: Two times a day (BID) | SUBCUTANEOUS | 0 refills | Status: DC

## 2016-05-01 ENCOUNTER — Encounter: Payer: Self-pay | Admitting: Radiation Oncology

## 2016-05-01 NOTE — Progress Notes (Signed)
  Radiation Oncology         [phone]) [phone] ________________________________  Name: Tim Ross MRN: [id number]  Date: 05/01/2016  DOB: 19-Nov-1934  End of Treatment Note  Diagnosis:  Bone metastases   Indication for treatment:  Palliative      Radiation treatment dates:   04/07/16-04/23/16  Site/dose:   Right humerus / 30 Gy in 10 fractions  Beams/energy:   Isodose plan/ 15X 6X  Narrative: The patient tolerated radiation treatment relatively well.  Pain and swelling in the right upper arm improved during his treatment.  Plan: The patient has completed radiation treatment. The patient will return to radiation oncology clinic for routine followup in one month. I advised them to call or return sooner if they have any questions or concerns related to their recovery or treatment.  -----------------------------------  Blair Promise, PhD, MD  This document serves as a record of services personally performed by Gery Pray, MD. It was created on his behalf by Bethann Humble, a trained medical scribe. The creation of this record is based on the scribe's personal observations and the provider's statements to them. This document has been checked and approved by the attending provider.

## 2016-05-05 ENCOUNTER — Other Ambulatory Visit: Payer: Self-pay | Admitting: *Deleted

## 2016-05-05 DIAGNOSIS — C3492 Malignant neoplasm of unspecified part of left bronchus or lung: Secondary | ICD-10-CM

## 2016-05-06 ENCOUNTER — Encounter: Payer: Self-pay | Admitting: Hematology

## 2016-05-06 ENCOUNTER — Other Ambulatory Visit: Payer: Self-pay | Admitting: *Deleted

## 2016-05-06 ENCOUNTER — Ambulatory Visit (HOSPITAL_BASED_OUTPATIENT_CLINIC_OR_DEPARTMENT_OTHER): Payer: Medicare Other | Admitting: Hematology

## 2016-05-06 ENCOUNTER — Other Ambulatory Visit (HOSPITAL_BASED_OUTPATIENT_CLINIC_OR_DEPARTMENT_OTHER): Payer: Medicare Other

## 2016-05-06 VITALS — BP 133/71 | HR 116 | Temp 98.2°F | Resp 18 | Ht 73.0 in | Wt 176.8 lb

## 2016-05-06 DIAGNOSIS — K558 Other vascular disorders of intestine: Secondary | ICD-10-CM | POA: Diagnosis not present

## 2016-05-06 DIAGNOSIS — C7970 Secondary malignant neoplasm of unspecified adrenal gland: Secondary | ICD-10-CM | POA: Diagnosis not present

## 2016-05-06 DIAGNOSIS — E46 Unspecified protein-calorie malnutrition: Secondary | ICD-10-CM | POA: Diagnosis not present

## 2016-05-06 DIAGNOSIS — Z8719 Personal history of other diseases of the digestive system: Secondary | ICD-10-CM | POA: Diagnosis not present

## 2016-05-06 DIAGNOSIS — C3490 Malignant neoplasm of unspecified part of unspecified bronchus or lung: Secondary | ICD-10-CM | POA: Diagnosis not present

## 2016-05-06 DIAGNOSIS — F329 Major depressive disorder, single episode, unspecified: Secondary | ICD-10-CM | POA: Diagnosis not present

## 2016-05-06 DIAGNOSIS — R5383 Other fatigue: Secondary | ICD-10-CM

## 2016-05-06 DIAGNOSIS — C3492 Malignant neoplasm of unspecified part of left bronchus or lung: Secondary | ICD-10-CM

## 2016-05-06 DIAGNOSIS — C7931 Secondary malignant neoplasm of brain: Secondary | ICD-10-CM | POA: Diagnosis not present

## 2016-05-06 DIAGNOSIS — I2699 Other pulmonary embolism without acute cor pulmonale: Secondary | ICD-10-CM

## 2016-05-06 DIAGNOSIS — C7951 Secondary malignant neoplasm of bone: Secondary | ICD-10-CM

## 2016-05-06 DIAGNOSIS — C770 Secondary and unspecified malignant neoplasm of lymph nodes of head, face and neck: Secondary | ICD-10-CM

## 2016-05-06 DIAGNOSIS — Z7901 Long term (current) use of anticoagulants: Secondary | ICD-10-CM | POA: Diagnosis not present

## 2016-05-06 DIAGNOSIS — Z86711 Personal history of pulmonary embolism: Secondary | ICD-10-CM | POA: Diagnosis not present

## 2016-05-06 LAB — COMPREHENSIVE METABOLIC PANEL
ALBUMIN: 2.6 g/dL — AB (ref 3.5–5.0)
ALK PHOS: 85 U/L (ref 40–150)
ALT: 38 U/L (ref 0–55)
ANION GAP: 8 meq/L (ref 3–11)
AST: 20 U/L (ref 5–34)
BUN: 11.5 mg/dL (ref 7.0–26.0)
CO2: 22 meq/L (ref 22–29)
CREATININE: 1.1 mg/dL (ref 0.7–1.3)
Calcium: 8.5 mg/dL (ref 8.4–10.4)
Chloride: 110 mEq/L — ABNORMAL HIGH (ref 98–109)
EGFR: 63 mL/min/{1.73_m2} — AB (ref >90)
GLUCOSE: 151 mg/dL — AB (ref 70–140)
Potassium: 5 mEq/L (ref 3.5–5.1)
SODIUM: 140 meq/L (ref 136–145)
TOTAL PROTEIN: 6.5 g/dL (ref 6.4–8.3)
Total Bilirubin: 0.26 mg/dL (ref 0.20–1.20)

## 2016-05-06 LAB — CBC & DIFF AND RETIC
BASO%: 0 % (ref 0.0–2.0)
Basophils Absolute: 0 10*3/uL (ref 0.0–0.1)
EOS ABS: 0.1 10*3/uL (ref 0.0–0.5)
EOS%: 2.8 % (ref 0.0–7.0)
HCT: 30.3 % — ABNORMAL LOW (ref 38.4–49.9)
HEMOGLOBIN: 9.5 g/dL — AB (ref 13.0–17.1)
IMMATURE RETIC FRACT: 19.7 % — AB (ref 3.00–10.60)
LYMPH%: 34.3 % (ref 14.0–49.0)
MCH: 28.6 pg (ref 27.2–33.4)
MCHC: 31.4 g/dL — ABNORMAL LOW (ref 32.0–36.0)
MCV: 91.3 fL (ref 79.3–98.0)
MONO#: 0.4 10*3/uL (ref 0.1–0.9)
MONO%: 11.4 % (ref 0.0–14.0)
NEUT#: 1.9 10*3/uL (ref 1.5–6.5)
NEUT%: 51.5 % (ref 39.0–75.0)
PLATELETS: 189 10*3/uL (ref 140–400)
RBC: 3.32 10*6/uL — ABNORMAL LOW (ref 4.20–5.82)
RDW: 17.7 % — ABNORMAL HIGH (ref 11.0–14.6)
RETIC CT ABS: 102.26 10*3/uL — AB (ref 34.80–93.90)
Retic %: 3.08 % — ABNORMAL HIGH (ref 0.80–1.80)
WBC: 3.6 10*3/uL — ABNORMAL LOW (ref 4.0–10.3)
lymph#: 1.2 10*3/uL (ref 0.9–3.3)
nRBC: 0 % (ref 0–0)

## 2016-05-06 MED ORDER — OXYCODONE HCL 5 MG PO TABS: 5.0000 mg | ORAL_TABLET | ORAL | 0 refills | Status: DC | PRN

## 2016-05-06 MED ORDER — APIXABAN 5 MG PO TABS: 5.0000 mg | ORAL_TABLET | Freq: Two times a day (BID) | ORAL | 2 refills | Status: DC

## 2016-05-09 ENCOUNTER — Telehealth: Payer: Self-pay | Admitting: Hematology

## 2016-05-09 ENCOUNTER — Other Ambulatory Visit: Payer: Self-pay | Admitting: *Deleted

## 2016-05-09 DIAGNOSIS — C3490 Malignant neoplasm of unspecified part of unspecified bronchus or lung: Secondary | ICD-10-CM

## 2016-05-09 NOTE — Telephone Encounter (Signed)
ADDED LAB TO 12/26 INFUSION. SPOKE WITH PATIENT HE IS AWARE.

## 2016-05-13 ENCOUNTER — Encounter (HOSPITAL_COMMUNITY): Payer: Self-pay

## 2016-05-13 ENCOUNTER — Other Ambulatory Visit (HOSPITAL_BASED_OUTPATIENT_CLINIC_OR_DEPARTMENT_OTHER): Payer: Medicare Other

## 2016-05-13 ENCOUNTER — Ambulatory Visit (HOSPITAL_BASED_OUTPATIENT_CLINIC_OR_DEPARTMENT_OTHER): Payer: Medicare Other

## 2016-05-13 VITALS — BP 144/77 | HR 77 | Temp 97.8°F | Resp 18

## 2016-05-13 DIAGNOSIS — C3490 Malignant neoplasm of unspecified part of unspecified bronchus or lung: Secondary | ICD-10-CM

## 2016-05-13 DIAGNOSIS — C3492 Malignant neoplasm of unspecified part of left bronchus or lung: Secondary | ICD-10-CM | POA: Diagnosis not present

## 2016-05-13 DIAGNOSIS — Z5111 Encounter for antineoplastic chemotherapy: Secondary | ICD-10-CM

## 2016-05-13 DIAGNOSIS — C7951 Secondary malignant neoplasm of bone: Secondary | ICD-10-CM

## 2016-05-13 LAB — COMPREHENSIVE METABOLIC PANEL
ALBUMIN: 2.7 g/dL — AB (ref 3.5–5.0)
ALK PHOS: 77 U/L (ref 40–150)
ALT: 26 U/L (ref 0–55)
AST: 21 U/L (ref 5–34)
Anion Gap: 7 mEq/L (ref 3–11)
BUN: 20.1 mg/dL (ref 7.0–26.0)
CALCIUM: 8.9 mg/dL (ref 8.4–10.4)
CO2: 21 mEq/L — ABNORMAL LOW (ref 22–29)
CREATININE: 1.2 mg/dL (ref 0.7–1.3)
Chloride: 109 mEq/L (ref 98–109)
EGFR: 55 mL/min/{1.73_m2} — ABNORMAL LOW (ref >90)
Glucose: 120 mg/dl (ref 70–140)
Potassium: 5 mEq/L (ref 3.5–5.1)
Sodium: 137 mEq/L (ref 136–145)
Total Bilirubin: <0.22 mg/dL (ref 0.20–1.20)
Total Protein: 6.5 g/dL (ref 6.4–8.3)

## 2016-05-13 LAB — CBC WITH DIFFERENTIAL/PLATELET
BASO%: 0.2 % (ref 0.0–2.0)
Basophils Absolute: 0 10*3/uL (ref 0.0–0.1)
EOS%: 0.7 % (ref 0.0–7.0)
Eosinophils Absolute: 0 10*3/uL (ref 0.0–0.5)
HEMATOCRIT: 30.3 % — AB (ref 38.4–49.9)
HEMOGLOBIN: 9.6 g/dL — AB (ref 13.0–17.1)
LYMPH#: 1 10*3/uL (ref 0.9–3.3)
LYMPH%: 18.1 % (ref 14.0–49.0)
MCH: 29.3 pg (ref 27.2–33.4)
MCHC: 31.7 g/dL — ABNORMAL LOW (ref 32.0–36.0)
MCV: 92.4 fL (ref 79.3–98.0)
MONO#: 1 10*3/uL — ABNORMAL HIGH (ref 0.1–0.9)
MONO%: 16.8 % — ABNORMAL HIGH (ref 0.0–14.0)
NEUT#: 3.7 10*3/uL (ref 1.5–6.5)
NEUT%: 64.2 % (ref 39.0–75.0)
Platelets: 429 10*3/uL — ABNORMAL HIGH (ref 140–400)
RBC: 3.28 10*6/uL — ABNORMAL LOW (ref 4.20–5.82)
RDW: 19.9 % — AB (ref 11.0–14.6)
WBC: 5.7 10*3/uL (ref 4.0–10.3)

## 2016-05-13 LAB — TECHNOLOGIST REVIEW

## 2016-05-13 MED ORDER — PALONOSETRON HCL INJECTION 0.25 MG/5ML
0.2500 mg | Freq: Once | INTRAVENOUS | Status: AC
  Administered 2016-05-13: 0.25 mg via INTRAVENOUS

## 2016-05-13 MED ORDER — CYANOCOBALAMIN 1000 MCG/ML IJ SOLN
INTRAMUSCULAR | Status: AC
  Filled 2016-05-13: qty 1

## 2016-05-13 MED ORDER — PALONOSETRON HCL INJECTION 0.25 MG/5ML
INTRAVENOUS | Status: AC
  Filled 2016-05-13: qty 5

## 2016-05-13 MED ORDER — DEXAMETHASONE SODIUM PHOSPHATE 10 MG/ML IJ SOLN
10.0000 mg | Freq: Once | INTRAMUSCULAR | Status: AC
  Administered 2016-05-13: 10 mg via INTRAVENOUS

## 2016-05-13 MED ORDER — DENOSUMAB 120 MG/1.7ML ~~LOC~~ SOLN
120.0000 mg | Freq: Once | SUBCUTANEOUS | Status: AC
  Administered 2016-05-13: 120 mg via SUBCUTANEOUS
  Filled 2016-05-13: qty 1.7

## 2016-05-13 MED ORDER — PEMETREXED DISODIUM CHEMO INJECTION 500 MG
335.0000 mg/m2 | Freq: Once | INTRAVENOUS | Status: AC
  Administered 2016-05-13: 700 mg via INTRAVENOUS
  Filled 2016-05-13: qty 20

## 2016-05-13 MED ORDER — CYANOCOBALAMIN 1000 MCG/ML IJ SOLN
1000.0000 ug | Freq: Once | INTRAMUSCULAR | Status: AC
  Administered 2016-05-13: 1000 ug via SUBCUTANEOUS

## 2016-05-13 MED ORDER — DEXAMETHASONE SODIUM PHOSPHATE 10 MG/ML IJ SOLN
INTRAMUSCULAR | Status: AC
  Filled 2016-05-13: qty 1

## 2016-05-13 MED ORDER — SODIUM CHLORIDE 0.9 % IV SOLN
350.0000 mg | Freq: Once | INTRAVENOUS | Status: AC
  Administered 2016-05-13: 350 mg via INTRAVENOUS
  Filled 2016-05-13: qty 35

## 2016-05-13 MED ORDER — SODIUM CHLORIDE 0.9 % IV SOLN
Freq: Once | INTRAVENOUS | Status: AC
  Administered 2016-05-13: 12:00:00 via INTRAVENOUS

## 2016-05-13 NOTE — Patient Instructions (Signed)
[facility] Discharge Instructions for Patients Receiving Chemotherapy  Today you received the following chemotherapy agents Alimta and Carboplatin.  To help prevent nausea and vomiting after your treatment, we encourage you to take your nausea medication as directed. NO ZOFRAN FOR 3 DAYS.  If you develop nausea and vomiting that is not controlled by your nausea medication, call the clinic.   BELOW ARE SYMPTOMS THAT SHOULD BE REPORTED IMMEDIATELY:  *FEVER GREATER THAN 100.5 F  *CHILLS WITH OR WITHOUT FEVER  NAUSEA AND VOMITING THAT IS NOT CONTROLLED WITH YOUR NAUSEA MEDICATION  *UNUSUAL SHORTNESS OF BREATH  *UNUSUAL BRUISING OR BLEEDING  TENDERNESS IN MOUTH AND THROAT WITH OR WITHOUT PRESENCE OF ULCERS  *URINARY PROBLEMS  *BOWEL PROBLEMS  UNUSUAL RASH Items with * indicate a potential emergency and should be followed up as soon as possible.  Feel free to call the clinic you have any questions or concerns. The clinic phone number is [phone].  Please show the [place] at check-in to the Emergency Department and triage nurse.

## 2016-05-14 ENCOUNTER — Encounter (HOSPITAL_COMMUNITY): Payer: Self-pay

## 2016-05-14 ENCOUNTER — Ambulatory Visit: Payer: Medicare Other

## 2016-05-15 NOTE — Progress Notes (Signed)
Tim Ross  HEMATOLOGY ONCOLOGY CLINIC NOTE  Date of service: .05/06/2016  Patient Care Team: Marin Olp, MD as PCP - General (Family Medicine) Latanya Maudlin, MD as Consulting Physician (Orthopedic Surgery)  CC: Follow-up for metastatic lung cancer  Diagnosis:  1)Metastatic Lung Adenocarcinoma with mets to bone , adrenal glands and concern for early mets to brain. 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 Patient has been referred to [facility] for deep enteroscopy for possible ablation. Has appointment in December 19th 2017    Current Treatment:  -Carboplatin/Alimta (palliative chemotherapy).  Low PDL 1 expression at 20%. EGFR , ALK , ROS 1 and BRAF mutation negative based on [employer] testing   -Referred to radiation oncology for consideration of palliative radiation to the painful right humeral metastasis -will need to monitor early brain met and consider palliative RT if growing/symptomatic  INTERVAL HISTORY:  Tim Ross is here for his scheduled followup prior to his planned C3 of chemotherapy on 12/26. He notes that his cough and breathing have improved. Rt shoulder pain is better. Grade 1 fatigue. No evidence of GIB. Has appointment at [place] today to determine if his small bowel AVM can be cauterized. No other acute new symptoms. He is tired of taking lovenox shots and would like to switch to something oral. We discussed considering switching to Eliquis and he is in favor of doing this.  REVIEW OF SYSTEMS:    10 Point review of systems of done and is negative except as noted above.  . Past Medical History:  Diagnosis Date  . Cancer ([place])   . Colonic polyp   . GERD (gastroesophageal reflux disease)   . Hyperlipidemia   . Hypertension   . Muscular degeneration   . Pneumonia 2002  .  Testicular cyst    removed-noncancerous    . Past Surgical History:  Procedure Laterality Date  . CATARACT EXTRACTION     right side  . COLONOSCOPY N/A 03/14/2016   Procedure: COLONOSCOPY;  Surgeon: Doran Stabler, MD;  Location: [facility] ENDOSCOPY;  Service: Endoscopy;  Laterality: N/A;  . ESOPHAGOGASTRODUODENOSCOPY N/A 03/11/2016   Procedure: ESOPHAGOGASTRODUODENOSCOPY (EGD);  Surgeon: Doran Stabler, MD;  Location: [facility];  Service: Endoscopy;  Laterality: N/A;  . GIVENS CAPSULE STUDY N/A 03/14/2016   Procedure: GIVENS CAPSULE STUDY;  Surgeon: Doran Stabler, MD;  Location: [facility] ENDOSCOPY;  Service: Endoscopy;  Laterality: N/A;  . KNEE SURGERY     scope on both  . LUMBAR LAMINECTOMY    . TRANSURETHRAL RESECTION OF PROSTATE     resolved issues    . Social History  Substance Use Topics  . Smoking status: Former Smoker    Packs/day: 1.50    Years: 40.00    Types: Cigarettes    Quit date: 12/30/1990  . Smokeless tobacco: Never Used  . Alcohol use No    ALLERGIES:  is allergic to celebrex [celecoxib].  MEDICATIONS:  Current Outpatient Prescriptions  Medication Sig Dispense Refill  . apixaban (ELIQUIS) 5 MG TABS tablet Take 1 tablet (5 mg total) by mouth 2 (two) times daily. Start 24h after last dose of lovenox. 60 tablet 2  . CINNAMON PO Take 2 tablets by mouth every morning.     . Cyanocobalamin 1000 MCG SUBL Place 1 tablet (1,000  mcg total) under the tongue daily. 14 tablet 0  . dexamethasone (DECADRON) 4 MG tablet Take 1 tab two times a day the day before Alimta chemo. Take 2 tabs two times a day starting the day after chemo for 3 days. 30 tablet 1  . feeding supplement, ENSURE ENLIVE, (ENSURE ENLIVE) LIQD Take 237 mLs by mouth 2 (two) times daily between meals. 734 mL 12  . folic acid (FOLVITE) 1 MG tablet Take 1 tablet (1 mg total) by mouth daily. Start 5-7 days before Alimta chemotherapy. Continue until 21 days after Alimta completed. 100 tablet 3  . mirtazapine  (REMERON) 15 MG tablet Take 1 tablet (15 mg total) by mouth at bedtime. 30 tablet 1  . Multiple Vitamin (MULTIVITAMIN WITH MINERALS) TABS tablet Take 1 tablet by mouth every morning.     . Multiple Vitamins-Minerals (ICAPS) CAPS Take 1 capsule by mouth 2 (two) times daily.    . Omega-3 Fatty Acids (FISH OIL PO) Take 1 capsule by mouth every morning.     Tim Ross omeprazole (PRILOSEC) 20 MG capsule Take 1 capsule (20 mg total) by mouth daily. 30 capsule 0  . ondansetron (ZOFRAN) 8 MG tablet Take 1 tablet (8 mg total) by mouth 2 (two) times daily as needed for refractory nausea / vomiting. Start on day 3 after chemo. 30 tablet 1  . oxyCODONE (ROXICODONE) 5 MG immediate release tablet Take 1-2 tablets (5-10 mg total) by mouth every 4 (four) hours as needed for moderate pain, severe pain or breakthrough pain. 60 tablet 0  . polyethylene glycol (MIRALAX) packet Take 17 g by mouth daily. 30 each 1  . prochlorperazine (COMPAZINE) 10 MG tablet Take 1 tablet (10 mg total) by mouth every 6 (six) hours as needed for nausea or vomiting. 30 tablet 0  . senna-docusate (SENOKOT-S) 8.6-50 MG tablet Take 2 tablets by mouth 2 (two) times daily. 60 tablet 2   No current facility-administered medications for this visit.     PHYSICAL EXAMINATION: ECOG PERFORMANCE STATUS: 2 - Symptomatic, <50% confined to bed  . Vitals:   05/06/16 0929  BP: 133/71  Pulse: (!) 116  Resp: 18  Temp: 98.2 F (36.8 C)    Filed Weights   05/06/16 0929  Weight: 176 lb 12.8 oz (80.2 kg)   .Body mass index is 23.33 kg/m.  GENERAL:alert, in no acute distress and comfortable SKIN: skin color, texture, turgor are normal, no rashes or significant lesions EYES: normal, conjunctiva are pink and non-injected, sclera clear OROPHARYNX:no exudate, no erythema and lips, buccal mucosa, and tongue normal  NECK: supple, no JVD, thyroid normal size, non-tender, without nodularity LYMPH:  no palpable lymphadenopathy in the cervical, axillary or  inguinal LUNGS: clear to auscultation with normal respiratory effort HEART: regular rate & rhythm,  no murmurs and no lower extremity edema ABDOMEN: abdomen soft, non-tender, normoactive bowel sounds  Musculoskeletal: no cyanosis of digits and no clubbing  PSYCH: alert & oriented x 3 with fluent speech NEURO: no focal motor/sensory deficits  LABORATORY DATA:   I have reviewed the data as listed  . CBC Latest Ref Rng & Units 05/13/2016 05/06/2016 04/22/2016  WBC 4.0 - 10.3 10e3/uL 5.7 3.6(L) 6.0  Hemoglobin 13.0 - 17.1 g/dL 9.6(L) 9.5(L) 9.7(L)  Hematocrit 38.4 - 49.9 % 30.3(L) 30.3(L) 30.5(L)  Platelets 140 - 400 10e3/uL 429(H) 189 629(H)    . CMP Latest Ref Rng & Units 05/13/2016 05/06/2016 04/22/2016  Glucose 70 - 140 mg/dl 120 151(H) 140  BUN 7.0 -  26.0 mg/dL 20.1 11.5 23.6  Creatinine 0.7 - 1.3 mg/dL 1.2 1.1 1.2  Sodium 136 - 145 mEq/L 137 140 139  Potassium 3.5 - 5.1 mEq/L 5.0 5.0 4.6  Chloride 96 - 112 mEq/L - - -  CO2 22 - 29 mEq/L 21(L) 22 21(L)  Calcium 8.4 - 10.4 mg/dL 8.9 8.5 9.2  Total Protein 6.4 - 8.3 g/dL 6.5 6.5 6.7  Total Bilirubin 0.20 - 1.20 mg/dL <0.22 0.26 0.23  Alkaline Phos 40 - 150 U/L 77 85 83  AST 5 - 34 U/L 21 20 17   ALT 0 - 55 U/L 26 38 28         RADIOGRAPHIC STUDIES: I have personally reviewed the radiological images as listed and agreed with the findings in the report. No results found.  ASSESSMENT & PLAN:   81 year old male with  #1 Metastatic Lung Adenocarcinoma  With mets to bones, adrenal gland and possible very subtle early mets to brain. Low PDL 1 expression at 20%. EGFR , ALK , ROS 1 and BRAF mutation negative based on [employer] testing  Patient is s/p 2 cycle of Carboplatin/Alimta. #2 painful right humerus metastatic lesion requiring significant pain medications. Pain nearly resolved Plan -No prohibitive toxicities at this time and labs are stable  -he is appropriate to proceed with his 3rd cycle of carboplatin/Alimta  with dose reductions and Neulasta support as per orders. -If he does not tolerate chemotherapy or on progression we'll consider switching him to PD1 inhibitor . -No targetable mutation noted on foundation one testing . -We will need to monitor his brain metastases closely with interval imaging and might need to interpret chemotherapy and consider palliative radiation if this becomes symptomatic or shows signs of progression. We plan to repeat this after next visit. Currently no FND or headaches or other symptoms. ---continue Marchelle Folks -CT chest/abd/pelvis 3-4 days prior to C4D1 -RTC with Dr Irene Limbo C4D1 on 06/03/2016 with labs   #3 recent GI bleeding due to small bowel AVM. No overt bleeding at this time. Hemoglobin stable. Plan -Would need to closely monitor for bleeding while on anticoagulation for pulmonary embolism. - follow-up at [facility] for consideration of deep enteroscopy to try to ablate his small bowel AVM today  #4 pulmonary embolism related to metastatic malignancy. -switched from lovenox to Eliquis as pre patient choice.  #6  bony metastases- rt shoulder pain resolved with Delton See and RT and pain mx #7 adrenal metastases-no evidence of adrenal insufficiency at this time Plan -continue Xgeva for skeletal metastases -completing palliative radiation to the right shoulder. - on OxyContin 10 mg by mouth now only at night time and continue oxycodone when necessary for breakthrough pain.  #8 protein calorie malnutrition  -Referral to Ernestene Kiel for nutritional consultation . -Encouraged to maintain small frequent meals.  #9 fatigue and depression -Optimize nutrition -Try to ambulate at least 20 minutes daily . -Remeron has helped with mood and appetite -- will continue this.   I spent 30 minutes counseling the patient face to face. The total time spent in the appointment was 40 minutes and more than 50% was on counseling and direct patient cares.    Sullivan Lone MD [place] AAHIVMS  [facility] Hematology/Oncology Physician [facility]  (Office):       [phone] (Work cell):  [phone] (Fax):           [phone]

## 2016-05-21 ENCOUNTER — Telehealth: Payer: Self-pay | Admitting: Hematology

## 2016-05-21 NOTE — Telephone Encounter (Signed)
I called and left a message about the appointment and stated for the patient to call back to confirm.

## 2016-05-27 ENCOUNTER — Encounter (HOSPITAL_COMMUNITY): Payer: Self-pay

## 2016-05-27 ENCOUNTER — Ambulatory Visit (HOSPITAL_COMMUNITY)
Admission: RE | Admit: 2016-05-27 | Discharge: 2016-05-27 | Disposition: A | Discharge status: Home / Self Care | Payer: Medicare Other | Source: Ambulatory Visit | Attending: Hematology | Admitting: Hematology

## 2016-05-27 DIAGNOSIS — J90 Pleural effusion, not elsewhere classified: Secondary | ICD-10-CM | POA: Diagnosis not present

## 2016-05-27 DIAGNOSIS — I700 Atherosclerosis of aorta: Secondary | ICD-10-CM | POA: Diagnosis not present

## 2016-05-27 DIAGNOSIS — J439 Emphysema, unspecified: Secondary | ICD-10-CM | POA: Insufficient documentation

## 2016-05-27 DIAGNOSIS — R590 Localized enlarged lymph nodes: Secondary | ICD-10-CM | POA: Insufficient documentation

## 2016-05-27 DIAGNOSIS — E279 Disorder of adrenal gland, unspecified: Secondary | ICD-10-CM | POA: Diagnosis not present

## 2016-05-27 DIAGNOSIS — C3492 Malignant neoplasm of unspecified part of left bronchus or lung: Secondary | ICD-10-CM | POA: Insufficient documentation

## 2016-05-27 DIAGNOSIS — C7972 Secondary malignant neoplasm of left adrenal gland: Secondary | ICD-10-CM | POA: Diagnosis not present

## 2016-05-27 DIAGNOSIS — R911 Solitary pulmonary nodule: Secondary | ICD-10-CM | POA: Diagnosis not present

## 2016-05-27 DIAGNOSIS — C7951 Secondary malignant neoplasm of bone: Secondary | ICD-10-CM | POA: Diagnosis not present

## 2016-05-27 MED ORDER — IOPAMIDOL (ISOVUE-300) INJECTION 61%
INTRAVENOUS | Status: AC
  Filled 2016-05-27: qty 100

## 2016-05-27 MED ORDER — IOPAMIDOL (ISOVUE-300) INJECTION 61%
100.0000 mL | Freq: Once | INTRAVENOUS | Status: AC | PRN
  Administered 2016-05-27: 100 mL via INTRAVENOUS

## 2016-05-28 ENCOUNTER — Encounter: Payer: Self-pay | Admitting: Oncology

## 2016-05-29 ENCOUNTER — Ambulatory Visit
Admission: RE | Admit: 2016-05-29 | Discharge: 2016-05-29 | Disposition: A | Discharge status: Home / Self Care | Payer: Medicare Other | Source: Ambulatory Visit | Attending: Radiation Oncology | Admitting: Radiation Oncology

## 2016-05-29 ENCOUNTER — Encounter: Payer: Self-pay | Admitting: Radiation Oncology

## 2016-05-29 VITALS — BP 125/70 | HR 96 | Temp 97.5°F | Ht 73.0 in | Wt 180.2 lb

## 2016-05-29 DIAGNOSIS — C3492 Malignant neoplasm of unspecified part of left bronchus or lung: Secondary | ICD-10-CM

## 2016-05-29 DIAGNOSIS — Z7901 Long term (current) use of anticoagulants: Secondary | ICD-10-CM | POA: Diagnosis not present

## 2016-05-29 DIAGNOSIS — Z79899 Other long term (current) drug therapy: Secondary | ICD-10-CM | POA: Insufficient documentation

## 2016-05-29 DIAGNOSIS — Z888 Allergy status to other drugs, medicaments and biological substances status: Secondary | ICD-10-CM | POA: Diagnosis not present

## 2016-05-29 DIAGNOSIS — C7951 Secondary malignant neoplasm of bone: Secondary | ICD-10-CM

## 2016-05-29 DIAGNOSIS — Z923 Personal history of irradiation: Secondary | ICD-10-CM | POA: Insufficient documentation

## 2016-05-29 DIAGNOSIS — C3490 Malignant neoplasm of unspecified part of unspecified bronchus or lung: Secondary | ICD-10-CM | POA: Insufficient documentation

## 2016-05-29 NOTE — Progress Notes (Signed)
Radiation Oncology         [phone] ________________________________  Name: Tim Ross MRN: [id number]  Date: 05/29/2016  DOB: 1934-11-03  Follow-Up Visit Note  CC: Garret Reddish, MD  Brunetta Genera, MD    ICD-9-CM ICD-10-CM   1. Bone metastases (HCC) 198.5 C79.51     Diagnosis:   Stage IV adenocarcinoma of the lung with osseous metastasis  Interval Since Last Radiation:  5 weeks  04/07/16-04/23/16: Right humerus treated to 30 Gy in 10 fractions  Narrative:  The patient returns today for routine follow-up of radiation completed to the Right Humerus.  On review of systems, the patient denies pain; he reports his right arm pain has greatly improved since completing radiation. He can lift his arm higher than he previously could before he feels pain. He reports taking oxycodone infrequently at this time. The patient reports he occasionally feels short of breath, but he denies any chest tightness or pain. The patient has gained some weight recently and feels like he is eating well. He had a CT chest, abdomen, pelvis on 05/27/16, and inquires about the results. The patient has chemotherapy scheduled early next week. He is accompanied by his wife today.             ALLERGIES:  is allergic to celebrex [celecoxib].  Meds: Current Outpatient Prescriptions  Medication Sig Dispense Refill  . apixaban (ELIQUIS) 5 MG TABS tablet Take 1 tablet (5 mg total) by mouth 2 (two) times daily. Start 24h after last dose of lovenox. 60 tablet 2  . CINNAMON PO Take 2 tablets by mouth every morning.     . Cyanocobalamin 1000 MCG SUBL Place 1 tablet (1,000 mcg total) under the tongue daily. 14 tablet 0  . dexamethasone (DECADRON) 4 MG tablet Take 1 tab two times a day the day before Alimta chemo. Take 2 tabs two times a day starting the day after chemo for 3 days. 30 tablet 1  . feeding supplement, ENSURE ENLIVE, (ENSURE ENLIVE) LIQD Take 237 mLs by mouth 2 (two) times daily between meals. 381  mL 12  . folic acid (FOLVITE) 1 MG tablet Take 1 tablet (1 mg total) by mouth daily. Start 5-7 days before Alimta chemotherapy. Continue until 21 days after Alimta completed. 100 tablet 3  . mirtazapine (REMERON) 15 MG tablet Take 1 tablet (15 mg total) by mouth at bedtime. 30 tablet 1  . Multiple Vitamin (MULTIVITAMIN WITH MINERALS) TABS tablet Take 1 tablet by mouth every morning.     . Multiple Vitamins-Minerals (ICAPS) CAPS Take 1 capsule by mouth 2 (two) times daily.    . Omega-3 Fatty Acids (FISH OIL PO) Take 1 capsule by mouth every morning.     Marland Kitchen omeprazole (PRILOSEC) 20 MG capsule Take 1 capsule (20 mg total) by mouth daily. 30 capsule 0  . ondansetron (ZOFRAN) 8 MG tablet Take 1 tablet (8 mg total) by mouth 2 (two) times daily as needed for refractory nausea / vomiting. Start on day 3 after chemo. 30 tablet 1  . polyethylene glycol (MIRALAX) packet Take 17 g by mouth daily. 30 each 1  . prochlorperazine (COMPAZINE) 10 MG tablet Take 1 tablet (10 mg total) by mouth every 6 (six) hours as needed for nausea or vomiting. 30 tablet 0  . senna-docusate (SENOKOT-S) 8.6-50 MG tablet Take 2 tablets by mouth 2 (two) times daily. 60 tablet 2  . oxyCODONE (ROXICODONE) 5 MG immediate release tablet Take 1-2 tablets (5-10 mg total) by  mouth every 4 (four) hours as needed for moderate pain, severe pain or breakthrough pain. (Patient not taking: Reported on 05/29/2016) 60 tablet 0   No current facility-administered medications for this encounter.     Physical Findings: The patient is in no acute distress. Patient is alert and oriented.  height is '6\' 1"'$  (1.854 m) and weight is 180 lb 3.2 oz (81.7 kg). His temperature is 97.5 F (36.4 C). His blood pressure is 125/70 and his pulse is 96. His oxygen saturation is 100%.  No significant changes. In general this is a well appearing Caucasian gentleman in no acute distress. He's alert and oriented x4 and appropriate throughout the examination. Cardiopulmonary  assessment is negative for acute distress and he exhibits normal effort.  Strength and coordination is symmetrical in upper extremities. Much better range of motion in right shoulder, he seems to be without pain at this time.  Lab Findings: Lab Results  Component Value Date   WBC 5.7 05/13/2016   HGB 9.6 (L) 05/13/2016   HCT 30.3 (L) 05/13/2016   MCV 92.4 05/13/2016   PLT 429 (H) 05/13/2016    Radiographic Findings: Ct Chest W Contrast  Result Date: 05/27/2016 CLINICAL DATA:  Left lung cancer staging with metastatic disease to bone adrenal glands. EXAM: CT CHEST, ABDOMEN, AND PELVIS WITH CONTRAST TECHNIQUE: Multidetector CT imaging of the chest, abdomen and pelvis was performed following the standard protocol during bolus administration of intravenous contrast. CONTRAST:  143m ISOVUE-300 IOPAMIDOL (ISOVUE-300) INJECTION 61% COMPARISON:  CT chest from 02/29/2016. Abdomen and pelvis CT from 11/08/2015. PET-CT from 03/05/2016. FINDINGS: CT CHEST FINDINGS Cardiovascular: Heart size is normal. Stable appearance trace anterior pericardial fluid. Coronary artery calcification is noted. Atherosclerotic calcification is noted in the wall of the thoracic aorta. Mediastinum/Nodes: 8 mm short axis prevascular lymph node on image 28 series 2 is measured at 15 mm on the previous PET-CT. 12 mm short axis left hilar lymph node seen on image 30 was 18 mm when I remeasure it on the prior exam. 18 mm short axis subcarinal lymph node was 20 mm previously. No right hilar lymphadenopathy. Abnormal soft tissue attenuation in the left hilum is progressive in the interval. Lungs/Pleura: Persistent interlobular septal thickening and confluent airspace opacity is identified in the posterior left upper lobe, progressive in the interval. There is now a 5.7 x 6.9 cm masslike area of confluent airspace opacity, markedly progressive in the interval. This is in the region of hypermetabolism seen on the previous PET-CT. 11 mm  posterior right upper lobe pulmonary nodule (image 77 series 7) is new in the interval. Scattered areas of peribronchovascular nodularity in the posterior right upper lobe suggest atypical infection. Musculoskeletal: Lytic lesions noted right humeral head. Compression deformity of an upper thoracic vertebral body is unchanged. 3.2 cm lytic lesion in the T10 vertebral body has progressed in the interval. CT ABDOMEN PELVIS FINDINGS Hepatobiliary: Scattered tiny hypodensities in the liver parenchyma were present previously are not substantially changed in the interval. These remain too small to characterize. Pancreas: No focal mass lesion. No dilatation of the main duct. No intraparenchymal cyst. No peripancreatic edema. Spleen: No splenomegaly. No focal mass lesion. Adrenals/Urinary Tract: Stable appearance 3.2 cm left adrenal lesion. Right adrenal gland remains normal in appearance. Cysts are identified in the kidneys bilaterally without appreciable interval change. No evidence for hydroureter. The urinary bladder appears normal for the degree of distention. Stomach/Bowel: Stomach is nondistended. No gastric wall thickening. No evidence of outlet obstruction. Duodenum is normally  positioned as is the ligament of Treitz. No small bowel wall thickening. No small bowel dilatation. The terminal ileum is normal. The appendix is normal. Diverticular changes are noted in the left colon without evidence of diverticulitis. Vascular/Lymphatic: There is no gastrohepatic or hepatoduodenal ligament lymphadenopathy. No intraperitoneal or retroperitoneal lymphadenopathy. There is abdominal aortic atherosclerosis without aneurysm. No pelvic sidewall lymphadenopathy. Reproductive: The prostate gland and seminal vesicles have normal imaging features. Other: No intraperitoneal free fluid. Musculoskeletal: No suspicious lytic or sclerotic bony abnormality in the abdomen and pelvis. IMPRESSION: 1. Marked interval progression of confluent  airspace opacity in the parahilar left lung, highly suspicious for disease progression. This region was hypermetabolic on the previous PET-CT and appears more confluent than typically seen for post radiation change. 2. Evidence of decreasing size mediastinal lymphadenopathy. 3. Interval development of a small posterior right upper lobe pulmonary nodule. Continued attention on follow-up recommended. 4. Interval progression of the lucent lesion in T10, seen to be hypermetabolic on previous PET-CT. Lesions in the right humeral head are incompletely visualized on today's study. 5. No change 3.2 cm left adrenal lesion, indeterminate by CT imaging. 6. Interval progression small left pleural effusion. 7.  Abdominal Aortic Atherosclerois (ICD10-170.0) 8.  Emphysema. ([id number]-O96.9) Electronically Signed   By: Misty Stanley M.D.   On: 05/27/2016 13:14   Ct Abdomen Pelvis W Contrast  Result Date: 05/27/2016 CLINICAL DATA:  Left lung cancer staging with metastatic disease to bone adrenal glands. EXAM: CT CHEST, ABDOMEN, AND PELVIS WITH CONTRAST TECHNIQUE: Multidetector CT imaging of the chest, abdomen and pelvis was performed following the standard protocol during bolus administration of intravenous contrast. CONTRAST:  158m ISOVUE-300 IOPAMIDOL (ISOVUE-300) INJECTION 61% COMPARISON:  CT chest from 02/29/2016. Abdomen and pelvis CT from 11/08/2015. PET-CT from 03/05/2016. FINDINGS: CT CHEST FINDINGS Cardiovascular: Heart size is normal. Stable appearance trace anterior pericardial fluid. Coronary artery calcification is noted. Atherosclerotic calcification is noted in the wall of the thoracic aorta. Mediastinum/Nodes: 8 mm short axis prevascular lymph node on image 28 series 2 is measured at 15 mm on the previous PET-CT. 12 mm short axis left hilar lymph node seen on image 30 was 18 mm when I remeasure it on the prior exam. 18 mm short axis subcarinal lymph node was 20 mm previously. No right hilar lymphadenopathy. Abnormal  soft tissue attenuation in the left hilum is progressive in the interval. Lungs/Pleura: Persistent interlobular septal thickening and confluent airspace opacity is identified in the posterior left upper lobe, progressive in the interval. There is now a 5.7 x 6.9 cm masslike area of confluent airspace opacity, markedly progressive in the interval. This is in the region of hypermetabolism seen on the previous PET-CT. 11 mm posterior right upper lobe pulmonary nodule (image 77 series 7) is new in the interval. Scattered areas of peribronchovascular nodularity in the posterior right upper lobe suggest atypical infection. Musculoskeletal: Lytic lesions noted right humeral head. Compression deformity of an upper thoracic vertebral body is unchanged. 3.2 cm lytic lesion in the T10 vertebral body has progressed in the interval. CT ABDOMEN PELVIS FINDINGS Hepatobiliary: Scattered tiny hypodensities in the liver parenchyma were present previously are not substantially changed in the interval. These remain too small to characterize. Pancreas: No focal mass lesion. No dilatation of the main duct. No intraparenchymal cyst. No peripancreatic edema. Spleen: No splenomegaly. No focal mass lesion. Adrenals/Urinary Tract: Stable appearance 3.2 cm left adrenal lesion. Right adrenal gland remains normal in appearance. Cysts are identified in the kidneys bilaterally without appreciable interval  change. No evidence for hydroureter. The urinary bladder appears normal for the degree of distention. Stomach/Bowel: Stomach is nondistended. No gastric wall thickening. No evidence of outlet obstruction. Duodenum is normally positioned as is the ligament of Treitz. No small bowel wall thickening. No small bowel dilatation. The terminal ileum is normal. The appendix is normal. Diverticular changes are noted in the left colon without evidence of diverticulitis. Vascular/Lymphatic: There is no gastrohepatic or hepatoduodenal ligament  lymphadenopathy. No intraperitoneal or retroperitoneal lymphadenopathy. There is abdominal aortic atherosclerosis without aneurysm. No pelvic sidewall lymphadenopathy. Reproductive: The prostate gland and seminal vesicles have normal imaging features. Other: No intraperitoneal free fluid. Musculoskeletal: No suspicious lytic or sclerotic bony abnormality in the abdomen and pelvis. IMPRESSION: 1. Marked interval progression of confluent airspace opacity in the parahilar left lung, highly suspicious for disease progression. This region was hypermetabolic on the previous PET-CT and appears more confluent than typically seen for post radiation change. 2. Evidence of decreasing size mediastinal lymphadenopathy. 3. Interval development of a small posterior right upper lobe pulmonary nodule. Continued attention on follow-up recommended. 4. Interval progression of the lucent lesion in T10, seen to be hypermetabolic on previous PET-CT. Lesions in the right humeral head are incompletely visualized on today's study. 5. No change 3.2 cm left adrenal lesion, indeterminate by CT imaging. 6. Interval progression small left pleural effusion. 7.  Abdominal Aortic Atherosclerois (ICD10-170.0) 8.  Emphysema. ([id number]-Y65.9) Electronically Signed   By: Misty Stanley M.D.   On: 05/27/2016 13:14    Impression:  Pain and range of motion movement in the right arm and shoulder area is much improved after the patient's palliative radiation therapy.  Plan: The patient will follow up with me as needed. He will continue to follow with medical oncology. I encouraged him to contact me with any questions or concerns that may arise.  ____________________________________  Blair Promise, PhD, MD  This document serves as a record of services personally performed by Gery Pray, MD. It was created on his behalf by Maryla Morrow, a trained medical scribe. The creation of this record is based on the scribe's personal observations and the  provider's statements to them. This document has been checked and approved by the attending provider.

## 2016-05-29 NOTE — Progress Notes (Signed)
Mr. Tim Ross is here for follow up of radiation completed 04/23/16 to his Right Humerus. He denies pain, and tells me his Right Arm pain has improved since completing radiation. He can lift his arm higher than before radiation before he feels pain. He is taking oxycodone infrequently at this time. He has gained some weight recently and feels like he is eating well. He has a CT Chest, Abdomen, Pelvis this past Tuesday and is asking about the results today.   BP 125/70   Pulse 96   Temp 97.5 F (36.4 C)   Ht '6\' 1"'$  (1.854 m)   Wt 180 lb 3.2 oz (81.7 kg)   SpO2 100% Comment: room air  BMI 23.77 kg/m    Wt Readings from Last 3 Encounters:  05/29/16 180 lb 3.2 oz (81.7 kg)  05/06/16 176 lb 12.8 oz (80.2 kg)  04/22/16 176 lb 3.2 oz (79.9 kg)

## 2016-06-03 ENCOUNTER — Ambulatory Visit (HOSPITAL_BASED_OUTPATIENT_CLINIC_OR_DEPARTMENT_OTHER): Payer: Medicare Other | Admitting: Hematology

## 2016-06-03 ENCOUNTER — Encounter: Payer: Self-pay | Admitting: Hematology

## 2016-06-03 ENCOUNTER — Ambulatory Visit: Payer: Medicare Other

## 2016-06-03 ENCOUNTER — Other Ambulatory Visit (HOSPITAL_BASED_OUTPATIENT_CLINIC_OR_DEPARTMENT_OTHER): Payer: Medicare Other

## 2016-06-03 VITALS — BP 131/61 | HR 110 | Temp 97.7°F | Resp 18 | Ht 73.0 in | Wt 181.4 lb

## 2016-06-03 DIAGNOSIS — F329 Major depressive disorder, single episode, unspecified: Secondary | ICD-10-CM | POA: Diagnosis not present

## 2016-06-03 DIAGNOSIS — Z87891 Personal history of nicotine dependence: Secondary | ICD-10-CM

## 2016-06-03 DIAGNOSIS — C3492 Malignant neoplasm of unspecified part of left bronchus or lung: Secondary | ICD-10-CM | POA: Diagnosis not present

## 2016-06-03 DIAGNOSIS — R5383 Other fatigue: Secondary | ICD-10-CM | POA: Diagnosis not present

## 2016-06-03 DIAGNOSIS — I2699 Other pulmonary embolism without acute cor pulmonale: Secondary | ICD-10-CM

## 2016-06-03 DIAGNOSIS — Z7189 Other specified counseling: Secondary | ICD-10-CM

## 2016-06-03 DIAGNOSIS — E46 Unspecified protein-calorie malnutrition: Secondary | ICD-10-CM | POA: Diagnosis not present

## 2016-06-03 DIAGNOSIS — C7951 Secondary malignant neoplasm of bone: Secondary | ICD-10-CM | POA: Diagnosis not present

## 2016-06-03 DIAGNOSIS — C7970 Secondary malignant neoplasm of unspecified adrenal gland: Secondary | ICD-10-CM

## 2016-06-03 LAB — COMPREHENSIVE METABOLIC PANEL
ALK PHOS: 77 U/L (ref 40–150)
ALT: 40 U/L (ref 0–55)
AST: 36 U/L — AB (ref 5–34)
Albumin: 2.6 g/dL — ABNORMAL LOW (ref 3.5–5.0)
Anion Gap: 9 mEq/L (ref 3–11)
BUN: 22.5 mg/dL (ref 7.0–26.0)
CO2: 20 mEq/L — ABNORMAL LOW (ref 22–29)
Calcium: 8.9 mg/dL (ref 8.4–10.4)
Chloride: 106 mEq/L (ref 98–109)
Creatinine: 1.3 mg/dL (ref 0.7–1.3)
EGFR: 50 mL/min/{1.73_m2} — AB (ref >90)
GLUCOSE: 172 mg/dL — AB (ref 70–140)
POTASSIUM: 4.5 meq/L (ref 3.5–5.1)
SODIUM: 135 meq/L — AB (ref 136–145)
Total Bilirubin: 0.22 mg/dL (ref 0.20–1.20)
Total Protein: 6.9 g/dL (ref 6.4–8.3)

## 2016-06-03 LAB — CBC & DIFF AND RETIC
BASO%: 0.1 % (ref 0.0–2.0)
Basophils Absolute: 0 10*3/uL (ref 0.0–0.1)
EOS ABS: 0 10*3/uL (ref 0.0–0.5)
EOS%: 0.1 % (ref 0.0–7.0)
HCT: 26.9 % — ABNORMAL LOW (ref 38.4–49.9)
HGB: 8.8 g/dL — ABNORMAL LOW (ref 13.0–17.1)
Immature Retic Fract: 20.8 % — ABNORMAL HIGH (ref 3.00–10.60)
LYMPH%: 18.1 % (ref 14.0–49.0)
MCH: 29.9 pg (ref 27.2–33.4)
MCHC: 32.7 g/dL (ref 32.0–36.0)
MCV: 91.5 fL (ref 79.3–98.0)
MONO#: 1.4 10*3/uL — ABNORMAL HIGH (ref 0.1–0.9)
MONO%: 18.1 % — AB (ref 0.0–14.0)
NEUT%: 63.6 % (ref 39.0–75.0)
NEUTROS ABS: 4.9 10*3/uL (ref 1.5–6.5)
Platelets: 386 10*3/uL (ref 140–400)
RBC: 2.94 10*6/uL — AB (ref 4.20–5.82)
RDW: 20.8 % — AB (ref 11.0–14.6)
RETIC %: 3.07 % — AB (ref 0.80–1.80)
Retic Ct Abs: 90.26 10*3/uL (ref 34.80–93.90)
WBC: 7.7 10*3/uL (ref 4.0–10.3)
lymph#: 1.4 10*3/uL (ref 0.9–3.3)

## 2016-06-03 MED ORDER — LORAZEPAM 0.5 MG PO TABS: 0.5000 mg | ORAL_TABLET | Freq: Three times a day (TID) | ORAL | 0 refills | Status: DC | PRN

## 2016-06-03 NOTE — Patient Instructions (Signed)
Atezolizumab injection What is this medicine? ATEZOLIZUMAB (a te zoe LIZ ue mab) is a monoclonal antibody. It is used to treat bladder cancer (urothelial cancer) and non-small cell lung cancer. COMMON BRAND NAME(S): Tecentriq What should I tell my health care provider before I take this medicine? They need to know if you have any of these conditions: -diabetes -immune system problems -infection -inflammatory bowel disease -liver disease -lung or breathing disease -lupus -nervous system problems like myasthenia gravis or Guillain-Barre syndrome -organ transplant -an unusual or allergic reaction to atezolizumab, other medicines, foods, dyes, or preservatives -pregnant or trying to get pregnant -breast-feeding How should I use this medicine? This medicine is for infusion into a vein. It is given by a health care professional in a hospital or clinic setting. A special MedGuide will be given to you before each treatment. Be sure to read this information carefully each time. Talk to your pediatrician regarding the use of this medicine in children. Special care may be needed. What if I miss a dose? It is important not to miss your dose. Call your doctor or health care professional if you are unable to keep an appointment. What may interact with this medicine? Interactions have not been studied. What should I watch for while using this medicine? Your condition will be monitored carefully while you are receiving this medicine. You may need blood work done while you are taking this medicine. Do not become pregnant while taking this medicine or for at least 5 months after stopping it. Women should inform their doctor if they wish to become pregnant or think they might be pregnant. There is a potential for serious side effects to an unborn child. Talk to your health care professional or pharmacist for more information. Do not breast-feed an infant while taking this medicine or for at least 5 months  after the last dose. What side effects may I notice from receiving this medicine? Side effects that you should report to your doctor or health care professional as soon as possible: -allergic reactions like skin rash, itching or hives, swelling of the face, lips, or tongue -black, tarry stools -bloody or watery diarrhea -breathing problems -changes in vision -chest pain or chest tightness -chills -facial flushing -fever -headache -signs and symptoms of high blood sugar such as dizziness; dry mouth; dry skin; fruity breath; nausea; stomach pain; increased hunger or thirst; increased urination -signs and symptoms of liver injury like dark yellow or brown urine; general ill feeling or flu-like symptoms; light-colored stools; loss of appetite; nausea; right upper belly pain; unusually weak or tired; yellowing of the eyes or skin -stomach pain -trouble passing urine or change in the amount of urine Side effects that usually do not require medical attention (report to your doctor or health care professional if they continue or are bothersome): -cough -diarrhea -joint pain -muscle pain -muscle weakness -tiredness -weight loss Where should I keep my medicine? This drug is given in a hospital or clinic and will not be stored at home.  2017 Elsevier/Gold Standard (2015-06-06 17:54:14)

## 2016-06-08 ENCOUNTER — Inpatient Hospital Stay (HOSPITAL_COMMUNITY)
Admission: EM | Admit: 2016-06-08 | Discharge: 2016-06-13 | DRG: 871 | Disposition: A | Discharge status: Home Health | Payer: Medicare Other | Attending: Internal Medicine | Admitting: Internal Medicine

## 2016-06-08 ENCOUNTER — Encounter (HOSPITAL_COMMUNITY): Payer: Self-pay | Admitting: Emergency Medicine

## 2016-06-08 ENCOUNTER — Other Ambulatory Visit: Payer: Self-pay

## 2016-06-08 ENCOUNTER — Emergency Department (HOSPITAL_COMMUNITY): Payer: Medicare Other

## 2016-06-08 DIAGNOSIS — K219 Gastro-esophageal reflux disease without esophagitis: Secondary | ICD-10-CM | POA: Diagnosis present

## 2016-06-08 DIAGNOSIS — J181 Lobar pneumonia, unspecified organism: Secondary | ICD-10-CM | POA: Diagnosis not present

## 2016-06-08 DIAGNOSIS — Z6823 Body mass index (BMI) 23.0-23.9, adult: Secondary | ICD-10-CM

## 2016-06-08 DIAGNOSIS — Z87891 Personal history of nicotine dependence: Secondary | ICD-10-CM | POA: Diagnosis not present

## 2016-06-08 DIAGNOSIS — J189 Pneumonia, unspecified organism: Secondary | ICD-10-CM | POA: Diagnosis present

## 2016-06-08 DIAGNOSIS — I129 Hypertensive chronic kidney disease with stage 1 through stage 4 chronic kidney disease, or unspecified chronic kidney disease: Secondary | ICD-10-CM | POA: Diagnosis present

## 2016-06-08 DIAGNOSIS — Z800 Family history of malignant neoplasm of digestive organs: Secondary | ICD-10-CM | POA: Diagnosis not present

## 2016-06-08 DIAGNOSIS — C7970 Secondary malignant neoplasm of unspecified adrenal gland: Secondary | ICD-10-CM | POA: Diagnosis present

## 2016-06-08 DIAGNOSIS — Z86010 Personal history of colonic polyps: Secondary | ICD-10-CM

## 2016-06-08 DIAGNOSIS — A419 Sepsis, unspecified organism: Secondary | ICD-10-CM | POA: Diagnosis not present

## 2016-06-08 DIAGNOSIS — E875 Hyperkalemia: Secondary | ICD-10-CM | POA: Diagnosis not present

## 2016-06-08 DIAGNOSIS — C3492 Malignant neoplasm of unspecified part of left bronchus or lung: Secondary | ICD-10-CM | POA: Diagnosis present

## 2016-06-08 DIAGNOSIS — J9602 Acute respiratory failure with hypercapnia: Secondary | ICD-10-CM | POA: Diagnosis not present

## 2016-06-08 DIAGNOSIS — R05 Cough: Secondary | ICD-10-CM | POA: Diagnosis not present

## 2016-06-08 DIAGNOSIS — R930 Abnormal findings on diagnostic imaging of skull and head, not elsewhere classified: Secondary | ICD-10-CM | POA: Diagnosis not present

## 2016-06-08 DIAGNOSIS — Z7901 Long term (current) use of anticoagulants: Secondary | ICD-10-CM

## 2016-06-08 DIAGNOSIS — Z9889 Other specified postprocedural states: Secondary | ICD-10-CM

## 2016-06-08 DIAGNOSIS — C7951 Secondary malignant neoplasm of bone: Secondary | ICD-10-CM | POA: Diagnosis present

## 2016-06-08 DIAGNOSIS — E440 Moderate protein-calorie malnutrition: Secondary | ICD-10-CM | POA: Diagnosis present

## 2016-06-08 DIAGNOSIS — Z888 Allergy status to other drugs, medicaments and biological substances status: Secondary | ICD-10-CM | POA: Diagnosis not present

## 2016-06-08 DIAGNOSIS — J9601 Acute respiratory failure with hypoxia: Secondary | ICD-10-CM | POA: Diagnosis present

## 2016-06-08 DIAGNOSIS — Z8719 Personal history of other diseases of the digestive system: Secondary | ICD-10-CM

## 2016-06-08 DIAGNOSIS — Z923 Personal history of irradiation: Secondary | ICD-10-CM | POA: Diagnosis not present

## 2016-06-08 DIAGNOSIS — D649 Anemia, unspecified: Secondary | ICD-10-CM | POA: Diagnosis present

## 2016-06-08 DIAGNOSIS — C3402 Malignant neoplasm of left main bronchus: Secondary | ICD-10-CM | POA: Diagnosis not present

## 2016-06-08 DIAGNOSIS — J910 Malignant pleural effusion: Secondary | ICD-10-CM | POA: Diagnosis present

## 2016-06-08 DIAGNOSIS — R079 Chest pain, unspecified: Secondary | ICD-10-CM | POA: Diagnosis not present

## 2016-06-08 DIAGNOSIS — R5383 Other fatigue: Secondary | ICD-10-CM | POA: Diagnosis not present

## 2016-06-08 DIAGNOSIS — Z7189 Other specified counseling: Secondary | ICD-10-CM | POA: Insufficient documentation

## 2016-06-08 DIAGNOSIS — I2699 Other pulmonary embolism without acute cor pulmonale: Secondary | ICD-10-CM | POA: Diagnosis not present

## 2016-06-08 DIAGNOSIS — E46 Unspecified protein-calorie malnutrition: Secondary | ICD-10-CM | POA: Diagnosis not present

## 2016-06-08 DIAGNOSIS — R0602 Shortness of breath: Secondary | ICD-10-CM | POA: Diagnosis not present

## 2016-06-08 DIAGNOSIS — F329 Major depressive disorder, single episode, unspecified: Secondary | ICD-10-CM | POA: Diagnosis not present

## 2016-06-08 DIAGNOSIS — J90 Pleural effusion, not elsewhere classified: Secondary | ICD-10-CM | POA: Diagnosis not present

## 2016-06-08 DIAGNOSIS — C3490 Malignant neoplasm of unspecified part of unspecified bronchus or lung: Secondary | ICD-10-CM

## 2016-06-08 DIAGNOSIS — N183 Chronic kidney disease, stage 3 (moderate): Secondary | ICD-10-CM | POA: Diagnosis present

## 2016-06-08 DIAGNOSIS — G893 Neoplasm related pain (acute) (chronic): Secondary | ICD-10-CM | POA: Diagnosis not present

## 2016-06-08 DIAGNOSIS — C384 Malignant neoplasm of pleura: Secondary | ICD-10-CM | POA: Diagnosis not present

## 2016-06-08 DIAGNOSIS — C7949 Secondary malignant neoplasm of other parts of nervous system: Secondary | ICD-10-CM | POA: Diagnosis not present

## 2016-06-08 DIAGNOSIS — E860 Dehydration: Secondary | ICD-10-CM | POA: Diagnosis not present

## 2016-06-08 DIAGNOSIS — E785 Hyperlipidemia, unspecified: Secondary | ICD-10-CM | POA: Diagnosis present

## 2016-06-08 LAB — CBC WITH DIFFERENTIAL/PLATELET
BASOS ABS: 0 10*3/uL (ref 0.0–0.1)
Basophils Relative: 0 %
EOS ABS: 0.2 10*3/uL (ref 0.0–0.7)
EOS PCT: 1 %
HCT: 26.4 % — ABNORMAL LOW (ref 39.0–52.0)
Hemoglobin: 8.3 g/dL — ABNORMAL LOW (ref 13.0–17.0)
Lymphocytes Relative: 14 %
Lymphs Abs: 2.2 10*3/uL (ref 0.7–4.0)
MCH: 29.3 pg (ref 26.0–34.0)
MCHC: 31.4 g/dL (ref 30.0–36.0)
MCV: 93.3 fL (ref 78.0–100.0)
MONO ABS: 2.2 10*3/uL — AB (ref 0.1–1.0)
Monocytes Relative: 14 %
NEUTROS PCT: 71 %
Neutro Abs: 11.3 10*3/uL — ABNORMAL HIGH (ref 1.7–7.7)
PLATELETS: 262 10*3/uL (ref 150–400)
RBC: 2.83 MIL/uL — AB (ref 4.22–5.81)
RDW: 21.2 % — ABNORMAL HIGH (ref 11.5–15.5)
WBC: 15.9 10*3/uL — AB (ref 4.0–10.5)

## 2016-06-08 LAB — COMPREHENSIVE METABOLIC PANEL
ALBUMIN: 2.5 g/dL — AB (ref 3.5–5.0)
ALT: 37 U/L (ref 17–63)
AST: 36 U/L (ref 15–41)
Alkaline Phosphatase: 73 U/L (ref 38–126)
Anion gap: 6 (ref 5–15)
BUN: 19 mg/dL (ref 6–20)
CHLORIDE: 102 mmol/L (ref 101–111)
CO2: 24 mmol/L (ref 22–32)
CREATININE: 1.33 mg/dL — AB (ref 0.61–1.24)
Calcium: 7.4 mg/dL — ABNORMAL LOW (ref 8.9–10.3)
GFR calc Af Amer: 57 mL/min — ABNORMAL LOW (ref >60)
GFR calc non Af Amer: 49 mL/min — ABNORMAL LOW (ref >60)
GLUCOSE: 113 mg/dL — AB (ref 65–99)
Potassium: 4.8 mmol/L (ref 3.5–5.1)
SODIUM: 132 mmol/L — AB (ref 135–145)
Total Bilirubin: 0.6 mg/dL (ref 0.3–1.2)
Total Protein: 6.6 g/dL (ref 6.5–8.1)

## 2016-06-08 LAB — TROPONIN I: Troponin I: <0.03 ng/mL (ref <0.03)

## 2016-06-08 MED ORDER — PANTOPRAZOLE SODIUM 40 MG PO TBEC
40.0000 mg | DELAYED_RELEASE_TABLET | Freq: Every day | ORAL | Status: DC
  Administered 2016-06-08 – 2016-06-13 (×6): 40 mg via ORAL
  Filled 2016-06-08 (×6): qty 1

## 2016-06-08 MED ORDER — PROCHLORPERAZINE MALEATE 10 MG PO TABS: 10.0000 mg | ORAL_TABLET | Freq: Four times a day (QID) | ORAL | 1 refills | Status: DC | PRN

## 2016-06-08 MED ORDER — LEVOFLOXACIN IN D5W 750 MG/150ML IV SOLN
750.0000 mg | INTRAVENOUS | Status: DC
  Administered 2016-06-09 – 2016-06-10 (×2): 750 mg via INTRAVENOUS
  Filled 2016-06-08 (×3): qty 150

## 2016-06-08 MED ORDER — CHLORHEXIDINE GLUCONATE 0.12 % MT SOLN
15.0000 mL | Freq: Two times a day (BID) | OROMUCOSAL | Status: DC
  Administered 2016-06-08 – 2016-06-13 (×9): 15 mL via OROMUCOSAL
  Filled 2016-06-08 (×8): qty 15

## 2016-06-08 MED ORDER — MIRTAZAPINE 15 MG PO TABS
15.0000 mg | ORAL_TABLET | Freq: Every day | ORAL | Status: DC
  Administered 2016-06-08 – 2016-06-12 (×5): 15 mg via ORAL
  Filled 2016-06-08: qty 1
  Filled 2016-06-08 (×2): qty 0.5
  Filled 2016-06-08 (×2): qty 1
  Filled 2016-06-08 (×2): qty 0.5
  Filled 2016-06-08 (×2): qty 1
  Filled 2016-06-08: qty 0.5

## 2016-06-08 MED ORDER — ACETAMINOPHEN 325 MG PO TABS
650.0000 mg | ORAL_TABLET | Freq: Four times a day (QID) | ORAL | Status: DC | PRN
  Administered 2016-06-08 – 2016-06-09 (×2): 650 mg via ORAL
  Filled 2016-06-08 (×2): qty 2

## 2016-06-08 MED ORDER — POLYETHYLENE GLYCOL 3350 17 G PO PACK
17.0000 g | PACK | Freq: Every day | ORAL | Status: DC
  Administered 2016-06-08 – 2016-06-12 (×4): 17 g via ORAL
  Filled 2016-06-08 (×6): qty 1

## 2016-06-08 MED ORDER — GUAIFENESIN-DM 100-10 MG/5ML PO SYRP
5.0000 mL | ORAL_SOLUTION | ORAL | Status: DC | PRN
  Administered 2016-06-08 – 2016-06-09 (×2): 5 mL via ORAL
  Filled 2016-06-08 (×2): qty 10

## 2016-06-08 MED ORDER — ONDANSETRON HCL 4 MG/2ML IJ SOLN: 4.0000 mg | Freq: Four times a day (QID) | INTRAMUSCULAR | Status: DC | PRN

## 2016-06-08 MED ORDER — SODIUM CHLORIDE 0.9% FLUSH
3.0000 mL | Freq: Two times a day (BID) | INTRAVENOUS | Status: DC
Start: 2016-06-08 — End: 2016-06-13
  Administered 2016-06-08 – 2016-06-13 (×7): 3 mL via INTRAVENOUS

## 2016-06-08 MED ORDER — OXYCODONE HCL 5 MG PO TABS
5.0000 mg | ORAL_TABLET | ORAL | Status: DC | PRN
  Administered 2016-06-09 – 2016-06-13 (×9): 10 mg via ORAL
  Filled 2016-06-08 (×9): qty 2

## 2016-06-08 MED ORDER — ONDANSETRON HCL 8 MG PO TABS: 8.0000 mg | ORAL_TABLET | Freq: Two times a day (BID) | ORAL | 1 refills | Status: DC | PRN

## 2016-06-08 MED ORDER — SENNOSIDES-DOCUSATE SODIUM 8.6-50 MG PO TABS
2.0000 | ORAL_TABLET | Freq: Two times a day (BID) | ORAL | Status: DC
  Administered 2016-06-08 – 2016-06-13 (×8): 2 via ORAL
  Filled 2016-06-08 (×11): qty 2

## 2016-06-08 MED ORDER — ADULT MULTIVITAMIN W/MINERALS CH
1.0000 | ORAL_TABLET | Freq: Every day | ORAL | Status: DC
  Administered 2016-06-09: 1 via ORAL
  Filled 2016-06-08 (×2): qty 1

## 2016-06-08 MED ORDER — ALBUTEROL SULFATE (2.5 MG/3ML) 0.083% IN NEBU
5.0000 mg | INHALATION_SOLUTION | Freq: Once | RESPIRATORY_TRACT | Status: AC
  Administered 2016-06-08: 5 mg via RESPIRATORY_TRACT
  Filled 2016-06-08: qty 6

## 2016-06-08 MED ORDER — ACETAMINOPHEN 650 MG RE SUPP: 650.0000 mg | Freq: Four times a day (QID) | RECTAL | Status: DC | PRN

## 2016-06-08 MED ORDER — OMEGA-3-ACID ETHYL ESTERS 1 G PO CAPS
1.0000 g | ORAL_CAPSULE | Freq: Every day | ORAL | Status: DC
  Administered 2016-06-08 – 2016-06-13 (×6): 1 g via ORAL
  Filled 2016-06-08 (×6): qty 1

## 2016-06-08 MED ORDER — ENSURE ENLIVE PO LIQD
237.0000 mL | Freq: Two times a day (BID) | ORAL | Status: DC
  Administered 2016-06-08 – 2016-06-13 (×10): 237 mL via ORAL

## 2016-06-08 MED ORDER — PROSIGHT PO TABS
1.0000 | ORAL_TABLET | Freq: Two times a day (BID) | ORAL | Status: DC
  Administered 2016-06-09 – 2016-06-13 (×9): 1 via ORAL
  Filled 2016-06-08 (×9): qty 1

## 2016-06-08 MED ORDER — IPRATROPIUM-ALBUTEROL 0.5-2.5 (3) MG/3ML IN SOLN
3.0000 mL | Freq: Four times a day (QID) | RESPIRATORY_TRACT | Status: DC
  Administered 2016-06-09 (×4): 3 mL via RESPIRATORY_TRACT
  Filled 2016-06-08 (×4): qty 3

## 2016-06-08 MED ORDER — APIXABAN 5 MG PO TABS
5.0000 mg | ORAL_TABLET | Freq: Two times a day (BID) | ORAL | Status: DC
  Filled 2016-06-08: qty 1

## 2016-06-08 MED ORDER — PROSIGHT PO TABS
1.0000 | ORAL_TABLET | Freq: Two times a day (BID) | ORAL | Status: DC
  Administered 2016-06-08: 1 via ORAL
  Filled 2016-06-08: qty 1

## 2016-06-08 MED ORDER — ORAL CARE MOUTH RINSE
15.0000 mL | Freq: Two times a day (BID) | OROMUCOSAL | Status: DC
  Administered 2016-06-08 – 2016-06-10 (×2): 15 mL via OROMUCOSAL

## 2016-06-08 MED ORDER — ENOXAPARIN SODIUM 80 MG/0.8ML ~~LOC~~ SOLN
80.0000 mg | Freq: Once | SUBCUTANEOUS | Status: AC
  Administered 2016-06-08: 80 mg via SUBCUTANEOUS
  Filled 2016-06-08: qty 0.8

## 2016-06-08 MED ORDER — FOLIC ACID 1 MG PO TABS
1.0000 mg | ORAL_TABLET | Freq: Every day | ORAL | Status: DC
  Administered 2016-06-09 – 2016-06-13 (×5): 1 mg via ORAL
  Filled 2016-06-08 (×6): qty 1

## 2016-06-08 MED ORDER — ONDANSETRON HCL 4 MG PO TABS: 4.0000 mg | ORAL_TABLET | Freq: Four times a day (QID) | ORAL | Status: DC | PRN

## 2016-06-08 MED ORDER — ENOXAPARIN SODIUM 80 MG/0.8ML ~~LOC~~ SOLN
80.0000 mg | Freq: Two times a day (BID) | SUBCUTANEOUS | Status: DC
  Administered 2016-06-09 – 2016-06-10 (×3): 80 mg via SUBCUTANEOUS
  Filled 2016-06-08 (×3): qty 0.8

## 2016-06-08 MED ORDER — CEFTRIAXONE SODIUM 1 G IJ SOLR
1.0000 g | Freq: Once | INTRAMUSCULAR | Status: AC
  Administered 2016-06-08: 1 g via INTRAVENOUS
  Filled 2016-06-08: qty 10

## 2016-06-08 MED ORDER — DEXTROSE-NACL 5-0.9 % IV SOLN
INTRAVENOUS | Status: AC
  Administered 2016-06-08 – 2016-06-09 (×3): via INTRAVENOUS

## 2016-06-08 MED ORDER — LORAZEPAM 0.5 MG PO TABS
0.5000 mg | ORAL_TABLET | Freq: Three times a day (TID) | ORAL | Status: DC | PRN
  Filled 2016-06-08: qty 1

## 2016-06-08 MED ORDER — AZITHROMYCIN 500 MG IV SOLR
500.0000 mg | INTRAVENOUS | Status: DC
Start: 2016-06-08 — End: 2016-06-08
  Administered 2016-06-08: 500 mg via INTRAVENOUS
  Filled 2016-06-08: qty 500

## 2016-06-08 MED ORDER — OXYCODONE HCL ER 10 MG PO T12A
10.0000 mg | EXTENDED_RELEASE_TABLET | Freq: Two times a day (BID) | ORAL | Status: DC
  Administered 2016-06-08 – 2016-06-13 (×9): 10 mg via ORAL
  Filled 2016-06-08 (×10): qty 1

## 2016-06-08 MED ORDER — IPRATROPIUM-ALBUTEROL 0.5-2.5 (3) MG/3ML IN SOLN
3.0000 mL | RESPIRATORY_TRACT | Status: DC | PRN
  Administered 2016-06-08: 3 mL via RESPIRATORY_TRACT
  Filled 2016-06-08: qty 3

## 2016-06-08 NOTE — ED Notes (Signed)
Hospitalist at bedside 

## 2016-06-08 NOTE — H&P (Signed)
History and Physical    Tim Ross NIO:[id number] DOB: 1934-11-12 DOA: 06/08/2016  PCP: Garret Reddish, MD   Patient coming from: Home  Chief Complaint: Dyspnea  HPI: Tim Ross is a 82 y.o. male with medical history significant of metastatic lung cancer and PE who presents to the hospital with worsening dyspnea. For last 4 days patient has been developing worsening dyspnea to the point where he is dyspneic with minimal efforts. His dyspnea is moderate to severe intensity, worse with exertion it has been associated with cough which has been productive with clear phlegm, wheezing, no fevers or chills. Positive generalized malaise and decreased energy. There seems to be no improvement factors to his dyspnea..  ED Course: Found to be hypoxic, further imaging suggesting pneumonia, referred to for further admission and evaluation.   Review of Systems:  1. General. Positive malaise and decreased level of energy 2. ENT. No runny nose or sore throat 3. Pulmonary. Positive shortness of breath, cough and wheezing. No hemoptysis. 4. Cardiovascular. No angina, claudication, no PND orthopnea. Positive right lower extremity edema. 5. Gastrointestinal. No nausea, vomiting or diarrhea 6. Endocrine. No tremors, heat or cold intolerance 8. Hematology no easy bruisability or frequent infections 9. Urology. No dysuria or increased urinary frequency 10. Neurology. No seizures or paresthesias  Past Medical History:  Diagnosis Date  . Cancer ([place])   . Colonic polyp   . GERD (gastroesophageal reflux disease)   . History of radiation therapy 04/07/16-04/23/16   right humerus 30 Gy in 10 fractions  . Hyperlipidemia   . Hypertension   . Muscular degeneration   . Pneumonia 2002  . Testicular cyst    removed-noncancerous    Past Surgical History:  Procedure Laterality Date  . CATARACT EXTRACTION     right side  . COLONOSCOPY N/A 03/14/2016   Procedure: COLONOSCOPY;  Surgeon: Doran Stabler, MD;  Location: [facility];  Service: Endoscopy;  Laterality: N/A;  . ESOPHAGOGASTRODUODENOSCOPY N/A 03/11/2016   Procedure: ESOPHAGOGASTRODUODENOSCOPY (EGD);  Surgeon: Doran Stabler, MD;  Location: [facility];  Service: Endoscopy;  Laterality: N/A;  . GIVENS CAPSULE STUDY N/A 03/14/2016   Procedure: GIVENS CAPSULE STUDY;  Surgeon: Doran Stabler, MD;  Location: [facility];  Service: Endoscopy;  Laterality: N/A;  . KNEE SURGERY     scope on both  . LUMBAR LAMINECTOMY    . TRANSURETHRAL RESECTION OF PROSTATE     resolved issues     reports that he quit smoking about 25 years ago. His smoking use included Cigarettes. He has a 60.00 pack-year smoking history. He has never used smokeless tobacco. He reports that he does not drink alcohol or use drugs.  Allergies  Allergen Reactions  . Celebrex [Celecoxib] Hives    Family History  Problem Relation Age of Onset  . Diabetes Mother   . Hypertension Mother   . Heart attack Father     late 60s, former smoker  . Heart disease Brother     56s, smokers  . Colon cancer Sister    Unacceptable: Noncontributory, unremarkable, or negative. Acceptable: Family history reviewed and not pertinent (If you reviewed it)  Prior to Admission medications   Medication Sig Start Date End Date Taking? Authorizing Provider  apixaban (ELIQUIS) 5 MG TABS tablet Take 1 tablet (5 mg total) by mouth 2 (two) times daily. Start 24h after last dose of lovenox. 05/06/16  Yes Brunetta Genera, MD  CINNAMON PO Take 2 tablets by mouth every  morning.    Yes Historical Provider, MD  feeding supplement, ENSURE ENLIVE, (ENSURE ENLIVE) LIQD Take 237 mLs by mouth 2 (two) times daily between meals. 03/16/16  Yes Clanford Marisa Hua, MD  folic acid (FOLVITE) 1 MG tablet TK 1 T PO D.START 5-7 DAYS BEFORE ALIMTA CHEMOTHERAPY.CONTINUE UNTIL 21 DAYS AFTER ALIMTA COMPLETED 03/27/16  Yes Historical Provider, MD  LORazepam (ATIVAN) 0.5 MG tablet Take 1 tablet (0.5 mg  total) by mouth every 8 (eight) hours as needed for anxiety. Can take 1 tab 30-40 mins prior to MRI to help with claustrophobia 06/03/16  Yes Gautam Juleen China, MD  mirtazapine (REMERON) 15 MG tablet Take 1 tablet (15 mg total) by mouth at bedtime. 04/14/16  Yes Brunetta Genera, MD  Multiple Vitamin (MULTIVITAMIN WITH MINERALS) TABS tablet Take 1 tablet by mouth every morning.    Yes Historical Provider, MD  Multiple Vitamins-Minerals (ICAPS) CAPS Take 1 capsule by mouth 2 (two) times daily.   Yes Historical Provider, MD  Omega-3 Fatty Acids (FISH OIL PO) Take 1 capsule by mouth every morning.    Yes Historical Provider, MD  omeprazole (PRILOSEC) 20 MG capsule Take 1 capsule (20 mg total) by mouth daily. 03/16/16  Yes Clanford Marisa Hua, MD  oxyCODONE (ROXICODONE) 5 MG immediate release tablet Take 1-2 tablets (5-10 mg total) by mouth every 4 (four) hours as needed for moderate pain, severe pain or breakthrough pain. 05/06/16  Yes Brunetta Genera, MD  OXYCONTIN 10 MG 12 hr tablet  03/27/16  Yes Historical Provider, MD  polyethylene glycol (MIRALAX) packet Take 17 g by mouth daily. 03/27/16  Yes Gautam Juleen China, MD  prochlorperazine (COMPAZINE) 10 MG tablet Take 1 tablet (10 mg total) by mouth every 6 (six) hours as needed for nausea or vomiting. 04/03/16  Yes Brunetta Genera, MD  senna-docusate (SENOKOT-S) 8.6-50 MG tablet Take 2 tablets by mouth 2 (two) times daily. 03/27/16  Yes Brunetta Genera, MD    Physical Exam: Vitals:   06/08/16 1145 06/08/16 1300 06/08/16 1330 06/08/16 1400  BP:  131/59 115/60 110/69  Pulse:  111 116 116  Resp:  (!) 29 (!) 30 (!) 27  Temp: 97.7 F (36.5 C)     SpO2:  90% 91% 91%  Weight:      Height:          Constitutional: deconditioned and ill looking appearing Vitals:   06/08/16 1145 06/08/16 1300 06/08/16 1330 06/08/16 1400  BP:  131/59 115/60 110/69  Pulse:  111 116 116  Resp:  (!) 29 (!) 30 (!) 27  Temp: 97.7 F (36.5 C)     SpO2:   90% 91% 91%  Weight:      Height:       Eyes: PERRL, lids and conjunctivae mild pale, no icterus.  ENMT: Mucous membranes are dry. Posterior pharynx clear of any exudate or lesions.Normal dentition.  Neck: normal, supple, no masses, no thyromegaly Respiratory: decreased breath sounds bilaterally, poor inspiratory effort, no wheezing, but scattered crackles, no rhonchi.   Cardiovascular: Regular rate and rhythm, no murmurs / rubs / gallops. Right lower extremity edema ++ pitting. 2+ pedal pulses. No carotid bruits.  Abdomen: no tenderness, no masses palpated. No hepatosplenomegaly. Bowel sounds positive.  Musculoskeletal: no clubbing / cyanosis. No joint deformity upper and lower extremities. Good ROM, no contractures. Normal muscle tone.  Skin: no rashes, lesions, ulcers. No induration Neurologic: CN 2-12 grossly intact. Sensation intact, DTR normal. Strength 5/5 in all 4.  Labs on Admission: I have personally reviewed following labs and imaging studies  CBC:  Recent Labs Lab 06/03/16 1240 06/08/16 1220  WBC 7.7 15.9*  NEUTROABS 4.9 11.3*  HGB 8.8* 8.3*  HCT 26.9* 26.4*  MCV 91.5 93.3  PLT 386 102   Basic Metabolic Panel:  Recent Labs Lab 06/03/16 1241 06/08/16 1220  NA 135* 132*  K 4.5 4.8  CL  --  102  CO2 20* 24  GLUCOSE 172* 113*  BUN 22.5 19  CREATININE 1.3 1.33*  CALCIUM 8.9 7.4*   GFR: Estimated Creatinine Clearance: 50.9 mL/min (by C-G formula based on SCr of 1.33 mg/dL (H)). Liver Function Tests:  Recent Labs Lab 06/03/16 1241 06/08/16 1220  AST 36* 36  ALT 40 37  ALKPHOS 77 73  BILITOT 0.22 0.6  PROT 6.9 6.6  ALBUMIN 2.6* 2.5*   No results for input(s): LIPASE, AMYLASE in the last 168 hours. No results for input(s): AMMONIA in the last 168 hours. Coagulation Profile: No results for input(s): INR, PROTIME in the last 168 hours. Cardiac Enzymes:  Recent Labs Lab 06/08/16 1220  TROPONINI <0.03   BNP (last 3 results)  Recent Labs   02/27/16 1031  PROBNP 37.0   HbA1C: No results for input(s): HGBA1C in the last 72 hours. CBG: No results for input(s): GLUCAP in the last 168 hours. Lipid Profile: No results for input(s): CHOL, HDL, LDLCALC, TRIG, CHOLHDL, LDLDIRECT in the last 72 hours. Thyroid Function Tests: No results for input(s): TSH, T4TOTAL, FREET4, T3FREE, THYROIDAB in the last 72 hours. Anemia Panel: No results for input(s): VITAMINB12, FOLATE, FERRITIN, TIBC, IRON, RETICCTPCT in the last 72 hours. Urine analysis:    Component Value Date/Time   COLORURINE YELLOW 03/09/2016 [place] 03/09/2016 1754   LABSPEC 1.023 03/09/2016 1754   PHURINE 5.5 03/09/2016 1754   GLUCOSEU NEGATIVE 03/09/2016 1754   HGBUR NEGATIVE 03/09/2016 1754   BILIRUBINUR NEGATIVE 03/09/2016 1754   BILIRUBINUR n 06/07/2014 0929   KETONESUR NEGATIVE 03/09/2016 1754   PROTEINUR NEGATIVE 03/09/2016 1754   UROBILINOGEN 0.2 06/07/2014 0929   NITRITE NEGATIVE 03/09/2016 1754   LEUKOCYTESUR NEGATIVE 03/09/2016 1754   Sepsis Labs: !!!!!!!!!!!!!!!!!!!!!!!!!!!!!!!!!!!!!!!!!!!! '@LABRCNTIP'$ (procalcitonin:4,lacticidven:4) )No results found for this or any previous visit (from the past 240 hour(s)).   Radiological Exams on Admission: Dg Chest 2 View  Result Date: 06/08/2016 CLINICAL DATA:  Lung cancer in shortness of breath, worsening recently. EXAM: CHEST  2 VIEW COMPARISON:  Radiography 03/09/2016.  CT 05/27/2016. FINDINGS: Heart size is normal. There is worsening of patchy attic spread of cancer throughout the left lung. Moderate size right effusion with atelectasis in the right lower lung. Question if there could be developing interstitial spread in the right perihilar region now. Bone island in the right fourth rib again noted. IMPRESSION: Worsened interstitial spread of cancer within the left lung. Left effusion with left base atelectasis. Question early development of perihilar interstitial spread on the right. Certainly,  coexistent infectious pneumonia in the left lung is not excluded. Electronically Signed   By: Nelson Chimes M.D.   On: 06/08/2016 12:43    EKG: Independently reviewed. Sinus tachycardia 112 bpm, lateral T-wave inversions, poor R-wave progressions on the precordial leads.   Assessment/Plan Active Problems:   * No active hospital problems. *   This is a 82 year old male with history of lung cancer and pulmonary embolism presents to the hospital with worsening dyspnea. It has been progressive over the last 4 days associated with cough and increased  mucus production. On initial physical examination his blood pressure 134/68, heart rate 117 bpm, respiratory rate 26, oxygen saturation 88% on room air. Temperature 98.4. He had a dry oral mucosa, decrease breath sounds at bases with scattered rales. Sodium 132, potassium 4.8, chloride 102, bicarbonate 24, glucose 113, BUN 19, creatinine 1.33, calcium 7.4, troponin less than 0.03, white count 15.9, hemoglobin 8.3, hematocrit 26.4, platelets 262. Chest x-ray AP and lateral personally review showing a opacity at the left mid lung zone, associated with large left pleural effusion. Compared to CT scan from 05/27/16, left effusion seems to be worse, the opacity on chest film correlates with description of malignancy.  Working diagnosis: Sepsis due to postobstructive pneumonia, complicated with hypoxic respiratory failure and worsening large left pleural effusion.   1. Sepsis, due to postobstructive pneumonia, present on admission. Patient will be admitted to the medical floor, he will placed on levofloxacin intravenously for IV antibiotics. Isotonic fluids intravenously, follow-up cell count, cultures and temperature curve. Bronchodilator therapy with duo nebs as needed.   2. Hypoxic respiratory failure. Acute. Probably a combination of malignancy, postobstructive pneumonia and worsening left pleural effusion. Will continue oximetry monitoring and supplemental  oxygen per nasal cannula to target O2 sat duration more than 92%. Will plan for thoracentesis in the morning. Likely malignant pleural effusion.  3. Pulmonary embolism. Continue anticoagulation with Lovenox, withhold apixaban, until all invasive procedures have been performed. Continue oxygen supplementation.  4. Metastatic adenocarcinoma the lung. 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 Will continue, , lorazepam and OxyContin/ oxycodone.  5. Depression. Continue mirtazapine  DVT prophylaxis: Anticoagulated for ct chest from Code Status: Full  Family Communication: I spoke with patient's family at the bedside and all questions were addressed.  Disposition Plan: Home Consults called: None Admission status: Inpatient   Mauricio Gerome Apley MD [facility] Pager [phone]  If 7PM-7AM, please contact night-coverage [email or web address] Password [email or web address]  06/08/2016, 2:51 PM

## 2016-06-08 NOTE — Progress Notes (Addendum)
ANTICOAGULATION CONSULT NOTE - Initial Consult  Pharmacy Consult for Eliquis --> lovenox Indication: hx PE pulmonary embolus  Allergies  Allergen Reactions  . Celebrex [Celecoxib] Hives    Patient Measurements: Height: '6\' 1"'$  (185.4 cm) Weight: 181 lb (82.1 kg) IBW/kg (Calculated) : 79.9 Heparin Dosing Weight:   Vital Signs: Temp: 98.4 F (36.9 C) (01/21 1603) Temp Source: Oral (01/21 1603) BP: 131/66 (01/21 1603) Pulse Rate: 107 (01/21 1603)  Labs:  Recent Labs  06/08/16 1220  HGB 8.3*  HCT 26.4*  PLT 262  CREATININE 1.33*  TROPONINI <0.03    Estimated Creatinine Clearance: 50.9 mL/min (by C-G formula based on SCr of 1.33 mg/dL (H)).   Medical History: Past Medical History:  Diagnosis Date  . Cancer ([place])   . Colonic polyp   . GERD (gastroesophageal reflux disease)   . History of radiation therapy 04/07/16-04/23/16   right humerus 30 Gy in 10 fractions  . Hyperlipidemia   . Hypertension   . Muscular degeneration   . Pneumonia 2002  . Testicular cyst    removed-noncancerous    Medications:  Eliquis 5 mg bid (last dose taken 06/08/16 at 0700)  Assessment: Patient is a 82 y.o M with hx of metastatic lung cancer on Eliquis PTA to for hx PE.  She presented to the ED on 06/08/16 with c/o SOB. CXR with noted right effusion.  To hold Eliquis for now for possible thoracentesis and to start lovenox.  Will also start levaquin for CAP.  - 1/21 CXR: ? PNA; right effusion - afeb, wbc elevated, scr 1.33 (crcl~51)  Goal of Therapy:  Anti-Xa level 0.6-1 units/ml 4hrs after LMWH dose given Monitor platelets by anticoagulation protocol: Yes   Plan:  - lovenox 80 mg SQ q12h (first dose at [place] today) - please advise if/when lovenox needs to be held for procedure - cbc q72hrs - monitor for s/s bleeding - levaquin 750 mg IV q24h (will give first dose on 06/09/16 since pt got azithro/ceftriaxone in the ED on 1/21 at ~3PM)   Erich Kochan P 06/08/2016,5:35 PM

## 2016-06-08 NOTE — Progress Notes (Signed)
ON PATHWAY REGIMEN - Non-Small Cell Lung  No Change  Continue With Treatment as Ordered.  [id number]: Carboplatin AUC=5 + Pemetrexed 500 mg/m2 q21 Days x 4 Cycles   A cycle is every 21 days:     Pemetrexed (Alimta(R)) 500 mg/m2 in 100 mL NS IV over 10 minutes followed 30 minutes later by carboplatin, manufacturer recommends not administering to patients with CrCl < 45 mL/min Dose Mod: None     Carboplatin (Paraplatin(R)) AUC=5 in 250 mL NS IV over 1 hour Dose Mod: None Additional Orders: Note: Patient to receive the following prior to the initiation of therapy: 1) Dexamethasone 4 mg orally twice daily x 6 doses.  First dose 24 hours before chemotherapy. 2) Folic acid >= 078 mcg orally daily.  First dose at least 5 days prior to the first dose of pemetrexed. 3) Vitamin B12 1,000 mcg intramuscularly every 9 weeks.  First dose at least 5 days prior to the first dose of pemetrexed.  All AUC calculations intended to be used in [employer]  **Always confirm dose/schedule in your pharmacy ordering system**    Patient Characteristics: Stage IV Metastatic, Non Squamous, Initial Chemotherapy/Immunotherapy, PS = 0, 1, PD-L1 Expression Positive 1-49% (TPS) / Negative / Not Tested / Not a Candidate for Immunotherapy AJCC M Stage: X AJCC N Stage: X AJCC T Stage: X Current Disease Status: Distant Metastases AJCC Stage Grouping: IV Histology: Non Squamous Cell ROS1 Rearrangement Status: Awaiting Test Results T790M Mutation Status: Not Applicable - EGFR Mutation Negative/Unknown Other Mutations/Biomarkers: No Other Actionable Mutations PD-L1 Expression Status: Quantity Not Sufficient Chemotherapy/Immunotherapy LOT: Initial Chemotherapy/Immunotherapy Molecular Targeted Therapy: Not Appropriate ALK Translocation Status: Awaiting Test Results Would you be surprised if this patient died  in the next year? I would be surprised if this patient died in the next year EGFR Mutation Status: Awaiting Test  Results BRAF [id number] Mutation Status: Awaiting Test Results Performance Status: PS = 0, 1  Intent of Therapy: Non-Curative / Palliative Intent, Discussed with Patient

## 2016-06-08 NOTE — ED Triage Notes (Signed)
Patient has lung cancer and been having SOB since Tuesday which got worse yesterday.  Patient having cough that is having clear mucous.

## 2016-06-08 NOTE — Progress Notes (Signed)
Tim Ross Kitchen  HEMATOLOGY ONCOLOGY CLINIC NOTE  Date of service: .06/03/2016  Patient Care Team: Marin Olp, MD as PCP - General (Family Medicine) Latanya Maudlin, MD as Consulting Physician (Orthopedic Surgery) Gery Pray MD (Radiation Oncology)   CC: Follow-up for metastatic lung cancer  Diagnosis:  1)Metastatic Lung Adenocarcinoma with mets to bone , adrenal glands and concern for early mets to brain. 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 Patient has been referred to [facility] for deep enteroscopy for possible ablation. Has appointment in December 19th 2017    Current Treatment:  -Carboplatin/Alimta (palliative chemotherapy).  Low PDL 1 expression at 20%. EGFR , ALK , ROS 1 and BRAF mutation negative based on [employer] testing   -Referred to radiation oncology for consideration of palliative radiation to the painful right humeral metastasis -will need to monitor early brain met and consider palliative RT if growing/symptomatic  INTERVAL HISTORY:  Tim Ross is here for his scheduled followupafter having completed 3 cycles of carbo/alimta chemotherapy. He notes that he had a good holiday season. Breathing stable. Rt arm pain resolved with RT and pain management. Notes some cough. Re-staging CT show signs of mixed response and some elements of disease progression. We had a long discussion regarding switching to next line of therapy with Immunotherapy and also discussion with Rad onc regarding possible role of palliative RT to the lung mass and T10 lesion. We discussed in details the pros and cons of Atezolizumab and informed consent was obtained.   REVIEW OF SYSTEMS:    10 Point review of systems of done and is negative except as noted above.  . Past Medical History:  Diagnosis Date  . Cancer  ([place])   . Colonic polyp   . GERD (gastroesophageal reflux disease)   . History of radiation therapy 04/07/16-04/23/16   right humerus 30 Gy in 10 fractions  . Hyperlipidemia   . Hypertension   . Muscular degeneration   . Pneumonia 2002  . Testicular cyst    removed-noncancerous    . Past Surgical History:  Procedure Laterality Date  . CATARACT EXTRACTION     right side  . COLONOSCOPY N/A 03/14/2016   Procedure: COLONOSCOPY;  Surgeon: Doran Stabler, MD;  Location: [facility] ENDOSCOPY;  Service: Endoscopy;  Laterality: N/A;  . ESOPHAGOGASTRODUODENOSCOPY N/A 03/11/2016   Procedure: ESOPHAGOGASTRODUODENOSCOPY (EGD);  Surgeon: Doran Stabler, MD;  Location: [facility];  Service: Endoscopy;  Laterality: N/A;  . GIVENS CAPSULE STUDY N/A 03/14/2016   Procedure: GIVENS CAPSULE STUDY;  Surgeon: Doran Stabler, MD;  Location: [facility];  Service: Endoscopy;  Laterality: N/A;  . KNEE SURGERY     scope on both  . LUMBAR LAMINECTOMY    . TRANSURETHRAL RESECTION OF PROSTATE     resolved issues    . Social History  Substance Use Topics  . Smoking status: Former Smoker    Packs/day: 1.50    Years: 40.00    Types: Cigarettes    Quit date: 12/30/1990  . Smokeless tobacco: Never Used  . Alcohol use No    ALLERGIES:  is allergic to celebrex [celecoxib].  MEDICATIONS:  No current facility-administered medications for this visit.    Current Outpatient Prescriptions  Medication Sig Dispense Refill  . ondansetron (ZOFRAN) 8 MG tablet Take 1 tablet (  8 mg total) by mouth 2 (two) times daily as needed (Nausea or vomiting). 30 tablet 1  . prochlorperazine (COMPAZINE) 10 MG tablet Take 1 tablet (10 mg total) by mouth every 6 (six) hours as needed (Nausea or vomiting). 30 tablet 1   Facility-Administered Medications Ordered in Other Visits  Medication Dose Route Frequency Provider Last Rate Last Dose  . acetaminophen (TYLENOL) tablet 650 mg  650 mg Oral Q6H PRN Tawni Millers,  MD   650 mg at 06/08/16 2126   Or  . acetaminophen (TYLENOL) suppository 650 mg  650 mg Rectal Q6H PRN Tawni Millers, MD      . chlorhexidine (PERIDEX) 0.12 % solution 15 mL  15 mL Mouth Rinse BID Tawni Millers, MD   15 mL at 06/08/16 2115  . dextrose 5 %-0.9 % sodium chloride infusion   Intravenous Continuous Tawni Millers, MD 75 mL/hr at 06/08/16 1728    . [START ON 06/09/2016] enoxaparin (LOVENOX) injection 80 mg  80 mg Subcutaneous Q12H Anh P Pham, RPH      . feeding supplement (ENSURE ENLIVE) (ENSURE ENLIVE) liquid 237 mL  237 mL Oral BID BM Tawni Millers, MD   237 mL at 06/08/16 1706  . [START ON 01/31/7828] folic acid (FOLVITE) tablet 1 mg  1 mg Oral Daily Mauricio Gerome Apley, MD      . guaiFENesin-dextromethorphan [facility] DM) 100-10 MG/5ML syrup 5 mL  5 mL Oral Q4H PRN Tawni Millers, MD   5 mL at 06/08/16 1811  . ipratropium-albuterol (DUONEB) 0.5-2.5 (3) MG/3ML nebulizer solution 3 mL  3 mL Nebulization Q4H PRN Tawni Millers, MD   3 mL at 06/08/16 1747  . ipratropium-albuterol (DUONEB) 0.5-2.5 (3) MG/3ML nebulizer solution 3 mL  3 mL Nebulization Q6H Mauricio Gerome Apley, MD      . Derrill Memo ON 06/09/2016] levofloxacin (LEVAQUIN) IVPB 750 mg  750 mg Intravenous Q24H Anh P Pham, RPH      . LORazepam (ATIVAN) tablet 0.5 mg  0.5 mg Oral Q8H PRN Tawni Millers, MD      . MEDLINE mouth rinse  15 mL Mouth Rinse q12n4p Tawni Millers, MD   15 mL at 06/08/16 1812  . mirtazapine (REMERON) tablet 15 mg  15 mg Oral QHS Tawni Millers, MD   15 mg at 06/08/16 2115  . [START ON 06/09/2016] multivitamin (PROSIGHT) tablet 1 tablet  1 tablet Oral BID Tawni Millers, MD      . Derrill Memo ON 06/09/2016] multivitamin with minerals tablet 1 tablet  1 tablet Oral Daily Mauricio Gerome Apley, MD      . omega-3 acid ethyl esters (LOVAZA) capsule 1 g  1 g Oral Daily Mauricio Gerome Apley, MD   1 g at 06/08/16 1707  . ondansetron  (ZOFRAN) tablet 4 mg  4 mg Oral Q6H PRN Tawni Millers, MD       Or  . ondansetron [facility]) injection 4 mg  4 mg Intravenous Q6H PRN Tawni Millers, MD      . oxyCODONE (Oxy IR/ROXICODONE) immediate release tablet 5-10 mg  5-10 mg Oral Q4H PRN Tawni Millers, MD      . oxyCODONE (OXYCONTIN) 12 hr tablet 10 mg  10 mg Oral Q12H Mauricio Gerome Apley, MD   10 mg at 06/08/16 2115  . pantoprazole (PROTONIX) EC tablet 40 mg  40 mg Oral Daily Mauricio Gerome Apley, MD   40 mg at 06/08/16 1708  . polyethylene glycol (  MIRALAX / GLYCOLAX) packet 17 g  17 g Oral Daily Mauricio Gerome Apley, MD   17 g at 06/08/16 1706  . senna-docusate (Senokot-S) tablet 2 tablet  2 tablet Oral BID Tawni Millers, MD   2 tablet at 06/08/16 1707  . sodium chloride flush (NS) 0.9 % injection 3 mL  3 mL Intravenous Q12H Mauricio Gerome Apley, MD   3 mL at 06/08/16 1709    PHYSICAL EXAMINATION: ECOG PERFORMANCE STATUS: 2 - Symptomatic, <50% confined to bed  . Vitals:   06/03/16 1300  BP: 131/61  Pulse: (!) 110  Resp: 18  Temp: 97.7 F (36.5 C)    Filed Weights   06/03/16 1300  Weight: 181 lb 6.4 oz (82.3 kg)   .Body mass index is 23.93 kg/m.  GENERAL:alert, in no acute distress and comfortable SKIN: skin color, texture, turgor are normal, no rashes or significant lesions EYES: normal, conjunctiva are pink and non-injected, sclera clear OROPHARYNX:no exudate, no erythema and lips, buccal mucosa, and tongue normal  NECK: supple, no JVD, thyroid normal size, non-tender, without nodularity LYMPH:  no palpable lymphadenopathy in the cervical, axillary or inguinal LUNGS: clear to auscultation with normal respiratory effort HEART: regular rate & rhythm,  no murmurs and no lower extremity edema ABDOMEN: abdomen soft, non-tender, normoactive bowel sounds  Musculoskeletal: no cyanosis of digits and no clubbing  PSYCH: alert & oriented x 3 with fluent speech NEURO: no focal  motor/sensory deficits  LABORATORY DATA:   I have reviewed the data as listed  . CBC Latest Ref Rng & Units 06/03/2016 05/13/2016  WBC 4.0 - 10.5 K/uL 7.7 5.7  Hemoglobin 13.0 - 17.0 g/dL 8.8(L) 9.6(L)  Hematocrit 39.0 - 52.0 % 26.9(L) 30.3(L)  Platelets 150 - 400 K/uL 386 429(H)    . CMP Latest Ref Rng & Units 06/03/2016 05/13/2016  Glucose 65 - 99 mg/dL 172(H) 120  BUN 6 - 20 mg/dL 22.5 20.1  Creatinine 0.61 - 1.24 mg/dL 1.3 1.2  Sodium 135 - 145 mmol/L 135(L) 137  Potassium 3.5 - 5.1 mmol/L 4.5 5.0  Chloride 101 - 111 mmol/L - -  CO2 22 - 32 mmol/L 20(L) 21(L)  Calcium 8.9 - 10.3 mg/dL 8.9 8.9  Total Protein 6.5 - 8.1 g/dL 6.9 6.5  Total Bilirubin 0.3 - 1.2 mg/dL 0.22 <0.22  Alkaline Phos 38 - 126 U/L 77 77  AST 15 - 41 U/L 36(H) 21  ALT 17 - 63 U/L 40 26         RADIOGRAPHIC STUDIES: I have personally reviewed the radiological images as listed and agreed with the findings in the report. Dg Chest 2 View  Result Date: 06/08/2016 CLINICAL DATA:  Lung cancer in shortness of breath, worsening recently. EXAM: CHEST  2 VIEW COMPARISON:  Radiography 03/09/2016.  CT 05/27/2016. FINDINGS: Heart size is normal. There is worsening of patchy attic spread of cancer throughout the left lung. Moderate size right effusion with atelectasis in the right lower lung. Question if there could be developing interstitial spread in the right perihilar region now. Bone island in the right fourth rib again noted. IMPRESSION: Worsened interstitial spread of cancer within the left lung. Left effusion with left base atelectasis. Question early development of perihilar interstitial spread on the right. Certainly, coexistent infectious pneumonia in the left lung is not excluded. Electronically Signed   By: Nelson Chimes M.D.   On: 06/08/2016 12:43   Ct Chest W Contrast  Result Date: 05/27/2016 CLINICAL DATA:  Left lung cancer  staging with metastatic disease to bone adrenal glands. EXAM: CT CHEST, ABDOMEN,  AND PELVIS WITH CONTRAST TECHNIQUE: Multidetector CT imaging of the chest, abdomen and pelvis was performed following the standard protocol during bolus administration of intravenous contrast. CONTRAST:  154m ISOVUE-300 IOPAMIDOL (ISOVUE-300) INJECTION 61% COMPARISON:  CT chest from 02/29/2016. Abdomen and pelvis CT from 11/08/2015. PET-CT from 03/05/2016. FINDINGS: CT CHEST FINDINGS Cardiovascular: Heart size is normal. Stable appearance trace anterior pericardial fluid. Coronary artery calcification is noted. Atherosclerotic calcification is noted in the wall of the thoracic aorta. Mediastinum/Nodes: 8 mm short axis prevascular lymph node on image 28 series 2 is measured at 15 mm on the previous PET-CT. 12 mm short axis left hilar lymph node seen on image 30 was 18 mm when I remeasure it on the prior exam. 18 mm short axis subcarinal lymph node was 20 mm previously. No right hilar lymphadenopathy. Abnormal soft tissue attenuation in the left hilum is progressive in the interval. Lungs/Pleura: Persistent interlobular septal thickening and confluent airspace opacity is identified in the posterior left upper lobe, progressive in the interval. There is now a 5.7 x 6.9 cm masslike area of confluent airspace opacity, markedly progressive in the interval. This is in the region of hypermetabolism seen on the previous PET-CT. 11 mm posterior right upper lobe pulmonary nodule (image 77 series 7) is new in the interval. Scattered areas of peribronchovascular nodularity in the posterior right upper lobe suggest atypical infection. Musculoskeletal: Lytic lesions noted right humeral head. Compression deformity of an upper thoracic vertebral body is unchanged. 3.2 cm lytic lesion in the T10 vertebral body has progressed in the interval. CT ABDOMEN PELVIS FINDINGS Hepatobiliary: Scattered tiny hypodensities in the liver parenchyma were present previously are not substantially changed in the interval. These remain too small to  characterize. Pancreas: No focal mass lesion. No dilatation of the main duct. No intraparenchymal cyst. No peripancreatic edema. Spleen: No splenomegaly. No focal mass lesion. Adrenals/Urinary Tract: Stable appearance 3.2 cm left adrenal lesion. Right adrenal gland remains normal in appearance. Cysts are identified in the kidneys bilaterally without appreciable interval change. No evidence for hydroureter. The urinary bladder appears normal for the degree of distention. Stomach/Bowel: Stomach is nondistended. No gastric wall thickening. No evidence of outlet obstruction. Duodenum is normally positioned as is the ligament of Treitz. No small bowel wall thickening. No small bowel dilatation. The terminal ileum is normal. The appendix is normal. Diverticular changes are noted in the left colon without evidence of diverticulitis. Vascular/Lymphatic: There is no gastrohepatic or hepatoduodenal ligament lymphadenopathy. No intraperitoneal or retroperitoneal lymphadenopathy. There is abdominal aortic atherosclerosis without aneurysm. No pelvic sidewall lymphadenopathy. Reproductive: The prostate gland and seminal vesicles have normal imaging features. Other: No intraperitoneal free fluid. Musculoskeletal: No suspicious lytic or sclerotic bony abnormality in the abdomen and pelvis. IMPRESSION: 1. Marked interval progression of confluent airspace opacity in the parahilar left lung, highly suspicious for disease progression. This region was hypermetabolic on the previous PET-CT and appears more confluent than typically seen for post radiation change. 2. Evidence of decreasing size mediastinal lymphadenopathy. 3. Interval development of a small posterior right upper lobe pulmonary nodule. Continued attention on follow-up recommended. 4. Interval progression of the lucent lesion in T10, seen to be hypermetabolic on previous PET-CT. Lesions in the right humeral head are incompletely visualized on today's study. 5. No change 3.2  cm left adrenal lesion, indeterminate by CT imaging. 6. Interval progression small left pleural effusion. 7.  Abdominal Aortic Atherosclerois (ICD10-170.0) 8.  Emphysema. (  [id number]) Electronically Signed   By: Misty Stanley M.D.   On: 05/27/2016 13:14   Ct Abdomen Pelvis W Contrast  Result Date: 05/27/2016 CLINICAL DATA:  Left lung cancer staging with metastatic disease to bone adrenal glands. EXAM: CT CHEST, ABDOMEN, AND PELVIS WITH CONTRAST TECHNIQUE: Multidetector CT imaging of the chest, abdomen and pelvis was performed following the standard protocol during bolus administration of intravenous contrast. CONTRAST:  140m ISOVUE-300 IOPAMIDOL (ISOVUE-300) INJECTION 61% COMPARISON:  CT chest from 02/29/2016. Abdomen and pelvis CT from 11/08/2015. PET-CT from 03/05/2016. FINDINGS: CT CHEST FINDINGS Cardiovascular: Heart size is normal. Stable appearance trace anterior pericardial fluid. Coronary artery calcification is noted. Atherosclerotic calcification is noted in the wall of the thoracic aorta. Mediastinum/Nodes: 8 mm short axis prevascular lymph node on image 28 series 2 is measured at 15 mm on the previous PET-CT. 12 mm short axis left hilar lymph node seen on image 30 was 18 mm when I remeasure it on the prior exam. 18 mm short axis subcarinal lymph node was 20 mm previously. No right hilar lymphadenopathy. Abnormal soft tissue attenuation in the left hilum is progressive in the interval. Lungs/Pleura: Persistent interlobular septal thickening and confluent airspace opacity is identified in the posterior left upper lobe, progressive in the interval. There is now a 5.7 x 6.9 cm masslike area of confluent airspace opacity, markedly progressive in the interval. This is in the region of hypermetabolism seen on the previous PET-CT. 11 mm posterior right upper lobe pulmonary nodule (image 77 series 7) is new in the interval. Scattered areas of peribronchovascular nodularity in the posterior right upper lobe  suggest atypical infection. Musculoskeletal: Lytic lesions noted right humeral head. Compression deformity of an upper thoracic vertebral body is unchanged. 3.2 cm lytic lesion in the T10 vertebral body has progressed in the interval. CT ABDOMEN PELVIS FINDINGS Hepatobiliary: Scattered tiny hypodensities in the liver parenchyma were present previously are not substantially changed in the interval. These remain too small to characterize. Pancreas: No focal mass lesion. No dilatation of the main duct. No intraparenchymal cyst. No peripancreatic edema. Spleen: No splenomegaly. No focal mass lesion. Adrenals/Urinary Tract: Stable appearance 3.2 cm left adrenal lesion. Right adrenal gland remains normal in appearance. Cysts are identified in the kidneys bilaterally without appreciable interval change. No evidence for hydroureter. The urinary bladder appears normal for the degree of distention. Stomach/Bowel: Stomach is nondistended. No gastric wall thickening. No evidence of outlet obstruction. Duodenum is normally positioned as is the ligament of Treitz. No small bowel wall thickening. No small bowel dilatation. The terminal ileum is normal. The appendix is normal. Diverticular changes are noted in the left colon without evidence of diverticulitis. Vascular/Lymphatic: There is no gastrohepatic or hepatoduodenal ligament lymphadenopathy. No intraperitoneal or retroperitoneal lymphadenopathy. There is abdominal aortic atherosclerosis without aneurysm. No pelvic sidewall lymphadenopathy. Reproductive: The prostate gland and seminal vesicles have normal imaging features. Other: No intraperitoneal free fluid. Musculoskeletal: No suspicious lytic or sclerotic bony abnormality in the abdomen and pelvis. IMPRESSION: 1. Marked interval progression of confluent airspace opacity in the parahilar left lung, highly suspicious for disease progression. This region was hypermetabolic on the previous PET-CT and appears more confluent  than typically seen for post radiation change. 2. Evidence of decreasing size mediastinal lymphadenopathy. 3. Interval development of a small posterior right upper lobe pulmonary nodule. Continued attention on follow-up recommended. 4. Interval progression of the lucent lesion in T10, seen to be hypermetabolic on previous PET-CT. Lesions in the right humeral head are incompletely visualized  on today's study. 5. No change 3.2 cm left adrenal lesion, indeterminate by CT imaging. 6. Interval progression small left pleural effusion. 7.  Abdominal Aortic Atherosclerois (ICD10-170.0) 8.  Emphysema. ([id number]) Electronically Signed   By: Misty Stanley M.D.   On: 05/27/2016 13:14    ASSESSMENT & PLAN:   82 year old male with  #1 Metastatic Lung Adenocarcinoma  With mets to bones, adrenal gland and possible very subtle early mets to brain. Low PDL 1 expression at 20%. EGFR , ALK , ROS 1 and BRAF mutation negative based on [employer] testing  Patient is s/p 2 cycle of Carboplatin/Alimta. #2 painful right humerus metastatic lesion requiring significant pain medications. Pain resolved with palliative RT Assessment CT CAP on 05/27/2016 shows signs of interval progression of left lung mass and T10 lesion. Plan -CT CAP results were discussed in details with the patient, his wife and daughter and they are aware of disease progression. -given progression on chemotherapy and inability to dose escalate chemotherapy regimen -- we shall switch to 2nd line palliative treatment with Atezolizumab ASAP hopefully by 06/09/2016 -We will had Dr Sondra Come consider him for palliative RT to his left lung mass and progressive T10 lesion.  -Changing treatment to Atezolizumab from current chemotherapy planned start 06/09/2016 -continue Xgeva q4weeks -MRI brain ASAp in 1-2 days -RTC with Dr Irene Limbo in 7 days post Huey Bienenstock for toxcity check with labs    #3 recent GI bleeding due to small bowel AVM. No overt bleeding at this  time. Hemoglobin stable. Plan -patient had follow-up at [facility] for consideration of deep enteroscopy to try to ablate his small bowel AVM - he was recommended against this. -no further overt GI bleeding noted at this time.  #4 pulmonary embolism related to metastatic malignancy. -switched from lovenox to Eliquis as pre patient choice.  #6  bony metastases- rt shoulder pain resolved with Xgeva and RT and pain mx #7 adrenal metastases-no evidence of adrenal insufficiency at this time. Mets stable. Plan -continue Xgeva for skeletal metastases -completed palliative radiation to the right shoulder. - on OxyContin 10 mg by mouth now only at night time and continue oxycodone when necessary for breakthrough pain.  #8 protein calorie malnutrition  -Encouraged to maintain small frequent meals.  #9 fatigue and depression -Optimize nutrition -Try to ambulate at least 20 minutes daily . -Remeron has helped with mood and appetite -- will continue this.  -Changing treatment to Atezolizumab from current chemotherapy planned start 06/09/2016 -continue Xgeva q4weeks -MRI brain ASAp in 1-2 days -RTC with Dr Irene Limbo in 7 days post Huey Bienenstock for toxcity check with labs    I spent 30 minutes counseling the patient face to face. The total time spent in the appointment was 40 minutes and more than 50% was on counseling and direct patient cares.    Sullivan Lone MD [place] AAHIVMS [facility] Hematology/Oncology Physician [facility]  (Office):       [phone] (Work cell):  [phone] (Fax):           [phone]

## 2016-06-08 NOTE — ED Notes (Signed)
ED Provider at bedside. 

## 2016-06-08 NOTE — Progress Notes (Signed)
DISCONTINUE ON PATHWAY REGIMEN - Non-Small Cell Lung  [id number]: Carboplatin AUC=5 + Pemetrexed 500 mg/m2 q21 Days x 4 Cycles   A cycle is every 21 days:     Pemetrexed (Alimta(R)) 500 mg/m2 in 100 mL NS IV over 10 minutes followed 30 minutes later by carboplatin, manufacturer recommends not administering to patients with CrCl < 45 mL/min Dose Mod: None     Carboplatin (Paraplatin(R)) AUC=5 in 250 mL NS IV over 1 hour Dose Mod: None Additional Orders: Note: Patient to receive the following prior to the initiation of therapy: 1) Dexamethasone 4 mg orally twice daily x 6 doses.  First dose 24 hours before chemotherapy. 2) Folic acid >= 846 mcg orally daily.  First dose at least 5 days prior to the first dose of pemetrexed. 3) Vitamin B12 1,000 mcg intramuscularly every 9 weeks.  First dose at least 5 days prior to the first dose of pemetrexed.  All AUC calculations intended to be used in [employer]  **Always confirm dose/schedule in your pharmacy ordering system**    REASON: Disease Progression PRIOR TREATMENT: [id number]: Carboplatin AUC=5 + Pemetrexed 500 mg/m2 q21 Days x 4 Cycles TREATMENT RESPONSE: Progressive Disease (PD)  START ON PATHWAY REGIMEN - Non-Small Cell Lung  [id number]: Atezolizumab 1200 mg q21 Days Until Progression or Toxicity   A cycle is every 21 days:     Atezolizumab (Tecentriq(R)) 1200 mg flat dose in 250 mL NS IV over 60 minutes day 1. If first infusion is tolerated, may give subsequent doses over 30 minutes. Dose Mod: None Additional Orders: Severe immune-mediated reactions can occur (e.g. pneumonitis, colitis, and hepatitis).  See prescribing information for more details including monitoring and required immediate management with steroids. Monitor thyroid and liver  function tests at baseline and periodically during therapy.  References: Danise Mina al. Elmore Guise. 2016 May 7;387(10031):1909-20 Tecentriq(TM) Huey Bienenstock) prescribing information, 09/2014  **Always  confirm dose/schedule in your pharmacy ordering system**    Patient Characteristics: Stage IV Metastatic, Non Squamous, Second Line - Chemotherapy/Immunotherapy, PS = 2, No Prior PD-1/PD-L1  Inhibitor and Immunotherapy Candidate AJCC T Category: TX Current Disease Status: Distant Metastases AJCC N Category: NX AJCC M Category: Staged < 8th Ed. AJCC 8 Stage Grouping: IVB Histology: Non Squamous Cell ROS1 Rearrangement Status: Negative T790M Mutation Status: Not Applicable - EGFR Mutation Negative/Unknown Other Mutations/Biomarkers: No Other Actionable Mutations PD-L1 Expression Status: PD-L1 Positive 1-49% (TPS) Chemotherapy/Immunotherapy LOT: Second Line Chemotherapy/Immunotherapy Molecular Targeted Therapy: Not Appropriate ALK Translocation Status: Negative Would you be surprised if this patient died  in the next year? I would be surprised if this patient died in the next year EGFR Mutation Status: Negative/Wild Type BRAF V600E Mutation Status: Negative Performance Status: PS = 2 Immunotherapy Candidate Status: Candidate for Immunotherapy Prior Immunotherapy Status: No Prior PD-1/PD-L1 Inhibitor  Intent of Therapy: Non-Curative / Palliative Intent, Discussed with Patient

## 2016-06-08 NOTE — ED Provider Notes (Signed)
[facility] DEPT Provider Note   CSN: [id number] Arrival date & time: 06/08/16  1134     History   Chief Complaint Chief Complaint  Patient presents with  . Shortness of Breath    HPI Tim Ross is a 82 y.o. male.  Pt  Complains of shortness of breath.  Pt has lung cancer with mets.  Pt has had increasing shortness of breath since October.  Pt reports increasing yesterday.  Pt complains of a cough today.  Pt is followed by Dr. Irene Limbo in oncology.  Pt requiring oxygen here.  He has not been on oxygen at home.   The history is provided by the patient. No language interpreter was used.  Shortness of Breath  This is a new problem. The average episode lasts 1 day. The current episode started 2 days ago. The problem has been gradually worsening. Pertinent negatives include no fever. The problem's precipitants include medical treatment. He has tried nothing for the symptoms. He has had prior hospitalizations. He has had no prior ED visits. He has had no prior ICU admissions.   Pt has  Past Medical History:  Diagnosis Date  . Cancer ([place])   . Colonic polyp   . GERD (gastroesophageal reflux disease)   . History of radiation therapy 04/07/16-04/23/16   right humerus 30 Gy in 10 fractions  . Hyperlipidemia   . Hypertension   . Muscular degeneration   . Pneumonia 2002  . Testicular cyst    removed-noncancerous    Patient Active Problem List   Diagnosis Date Noted  . Adenocarcinoma of left lung, stage 4 ([place]) 03/27/2016  . Bone metastases ([place]) 03/27/2016  . DVT of lower extremity, bilateral ([place]) 03/14/2016  . Metastatic Non-small cell lung cancer  03/14/2016  . Acute gout of right foot 03/14/2016  . Malnutrition of moderate degree 03/12/2016  . Acute blood loss anemia   . Neoplasm related pain   . GI bleed 03/09/2016  . Acute pulmonary embolism ([place]) 03/04/2016  . CKD (chronic kidney disease), stage III 06/08/2014  . Hyperglycemia 06/08/2014  . Former smoker  05/29/2014  . Macular degeneration   . Hyperlipidemia 04/29/2007  . Essential hypertension 04/29/2007  . GERD 04/29/2007  . History of colonic polyps 04/29/2007    Past Surgical History:  Procedure Laterality Date  . CATARACT EXTRACTION     right side  . COLONOSCOPY N/A 03/14/2016   Procedure: COLONOSCOPY;  Surgeon: Doran Stabler, MD;  Location: [facility]COPY;  Service: Endoscopy;  Laterality: N/A;  . ESOPHAGOGASTRODUODENOSCOPY N/A 03/11/2016   Procedure: ESOPHAGOGASTRODUODENOSCOPY (EGD);  Surgeon: Doran Stabler, MD;  Location: [facility];  Service: Endoscopy;  Laterality: N/A;  . GIVENS CAPSULE STUDY N/A 03/14/2016   Procedure: GIVENS CAPSULE STUDY;  Surgeon: Doran Stabler, MD;  Location: [facility]COPY;  Service: Endoscopy;  Laterality: N/A;  . KNEE SURGERY     scope on both  . LUMBAR LAMINECTOMY    . TRANSURETHRAL RESECTION OF PROSTATE     resolved issues       Home Medications    Prior to Admission medications   Medication Sig Start Date End Date Taking? Authorizing Provider  apixaban (ELIQUIS) 5 MG TABS tablet Take 1 tablet (5 mg total) by mouth 2 (two) times daily. Start 24h after last dose of lovenox. 05/06/16   Brunetta Genera, MD  CINNAMON PO Take 2 tablets by mouth every morning.     Historical Provider, MD  feeding supplement, ENSURE ENLIVE, (ENSURE  ENLIVE) LIQD Take 237 mLs by mouth 2 (two) times daily between meals. 03/16/16   Clanford Marisa Hua, MD  LORazepam (ATIVAN) 0.5 MG tablet Take 1 tablet (0.5 mg total) by mouth every 8 (eight) hours as needed for anxiety. Can take 1 tab 30-40 mins prior to MRI to help with claustrophobia 06/03/16   Brunetta Genera, MD  mirtazapine (REMERON) 15 MG tablet Take 1 tablet (15 mg total) by mouth at bedtime. 04/14/16   Brunetta Genera, MD  Multiple Vitamin (MULTIVITAMIN WITH MINERALS) TABS tablet Take 1 tablet by mouth every morning.     Historical Provider, MD  Multiple Vitamins-Minerals (ICAPS) CAPS Take 1  capsule by mouth 2 (two) times daily.    Historical Provider, MD  Omega-3 Fatty Acids (FISH OIL PO) Take 1 capsule by mouth every morning.     Historical Provider, MD  omeprazole (PRILOSEC) 20 MG capsule Take 1 capsule (20 mg total) by mouth daily. 03/16/16   Clanford Marisa Hua, MD  oxyCODONE (ROXICODONE) 5 MG immediate release tablet Take 1-2 tablets (5-10 mg total) by mouth every 4 (four) hours as needed for moderate pain, severe pain or breakthrough pain. 05/06/16   Brunetta Genera, MD  polyethylene glycol [facility]) packet Take 17 g by mouth daily. 03/27/16   Brunetta Genera, MD  prochlorperazine (COMPAZINE) 10 MG tablet Take 1 tablet (10 mg total) by mouth every 6 (six) hours as needed for nausea or vomiting. 04/03/16   Brunetta Genera, MD  senna-docusate (SENOKOT-S) 8.6-50 MG tablet Take 2 tablets by mouth 2 (two) times daily. 03/27/16   Brunetta Genera, MD    Family History Family History  Problem Relation Age of Onset  . Diabetes Mother   . Hypertension Mother   . Heart attack Father     late 63s, former smoker  . Heart disease Brother     65s, smokers  . Colon cancer Sister     Social History Social History  Substance Use Topics  . Smoking status: Former Smoker    Packs/day: 1.50    Years: 40.00    Types: Cigarettes    Quit date: 12/30/1990  . Smokeless tobacco: Never Used  . Alcohol use No     Allergies   Celebrex [celecoxib]   Review of Systems Review of Systems  Constitutional: Negative for fever.  Respiratory: Positive for shortness of breath.   All other systems reviewed and are negative.    Physical Exam Updated Vital Signs BP 115/60   Pulse 116   Temp 97.7 F (36.5 C)   Resp (!) 30   Ht '6\' 1"'$  (1.854 m)   Wt 82.1 kg   SpO2 91%   BMI 23.88 kg/m   Physical Exam  Constitutional: He is oriented to person, place, and time. He appears well-developed and well-nourished.  HENT:  Head: Normocephalic.  Right Ear: External ear normal.    Left Ear: External ear normal.  Nose: Nose normal.  Mouth/Throat: Oropharynx is clear and moist.  Eyes: EOM are normal. Pupils are equal, round, and reactive to light.  Neck: Normal range of motion.  Cardiovascular: Normal rate and regular rhythm.   Pulmonary/Chest: Effort normal. He has wheezes.  Abdominal: Soft. He exhibits no distension.  Musculoskeletal: Normal range of motion.  Neurological: He is alert and oriented to person, place, and time.  Skin: Skin is warm.  Psychiatric: He has a normal mood and affect.  Nursing note and vitals reviewed.    ED Treatments /  Results  Labs (all labs ordered are listed, but only abnormal results are displayed) Labs Reviewed  CBC WITH DIFFERENTIAL/PLATELET - Abnormal; Notable for the following:       Result Value   WBC 15.9 (*)    RBC 2.83 (*)    Hemoglobin 8.3 (*)    HCT 26.4 (*)    RDW 21.2 (*)    Neutro Abs 11.3 (*)    Monocytes Absolute 2.2 (*)    All other components within normal limits  COMPREHENSIVE METABOLIC PANEL - Abnormal; Notable for the following:    Sodium 132 (*)    Glucose, Bld 113 (*)    Creatinine, Ser 1.33 (*)    Calcium 7.4 (*)    Albumin 2.5 (*)    GFR calc non Af Amer 49 (*)    GFR calc Af Amer 57 (*)    All other components within normal limits  TROPONIN I    EKG  EKG Interpretation None       Radiology Dg Chest 2 View  Result Date: 06/08/2016 CLINICAL DATA:  Lung cancer in shortness of breath, worsening recently. EXAM: CHEST  2 VIEW COMPARISON:  Radiography 03/09/2016.  CT 05/27/2016. FINDINGS: Heart size is normal. There is worsening of patchy attic spread of cancer throughout the left lung. Moderate size right effusion with atelectasis in the right lower lung. Question if there could be developing interstitial spread in the right perihilar region now. Bone island in the right fourth rib again noted. IMPRESSION: Worsened interstitial spread of cancer within the left lung. Left effusion with left  base atelectasis. Question early development of perihilar interstitial spread on the right. Certainly, coexistent infectious pneumonia in the left lung is not excluded. Electronically Signed   By: Nelson Chimes M.D.   On: 06/08/2016 12:43    Procedures Procedures (including critical care time)  Medications Ordered in ED Medications  albuterol (PROVENTIL) (2.5 MG/3ML) 0.083% nebulizer solution 5 mg (5 mg Nebulization Given 06/08/16 1215)     Initial Impression / Assessment and Plan / ED Course  I have reviewed the triage vital signs and the nursing notes.  Pertinent labs & imaging results that were available during my care of the patient were reviewed by me and considered in my medical decision making (see chart for details).     Chest xray shows moderate size effusion and possible pneumonia.  Pt given albuterol neb with slight improvement.  Pt on 02 at 2 liters.  Rocephin and Zithromax ordered.  I will consult hospitalist for admission due to effusion, possible pneumonia, new oxygen requirement.  Final Clinical Impressions(s) / ED Diagnoses   Final diagnoses:  Community acquired pneumonia of left lower lobe of lung ([place])  Pleural effusion, bilateral  Malignant neoplasm of lung, unspecified laterality, unspecified part of lung [facility])    New Prescriptions New Prescriptions   No medications on file     Fransico Meadow, PA-C 06/08/16 [place], MD 06/09/16 1058

## 2016-06-09 ENCOUNTER — Inpatient Hospital Stay (HOSPITAL_COMMUNITY): Payer: Medicare Other

## 2016-06-09 DIAGNOSIS — J9601 Acute respiratory failure with hypoxia: Secondary | ICD-10-CM

## 2016-06-09 LAB — COMPREHENSIVE METABOLIC PANEL
ALBUMIN: 2.3 g/dL — AB (ref 3.5–5.0)
ALT: 34 U/L (ref 17–63)
ANION GAP: 7 (ref 5–15)
AST: 36 U/L (ref 15–41)
Alkaline Phosphatase: 63 U/L (ref 38–126)
BUN: 19 mg/dL (ref 6–20)
CO2: 22 mmol/L (ref 22–32)
Calcium: 7.4 mg/dL — ABNORMAL LOW (ref 8.9–10.3)
Chloride: 106 mmol/L (ref 101–111)
Creatinine, Ser: 1.23 mg/dL (ref 0.61–1.24)
GFR calc Af Amer: >60 mL/min (ref >60)
GFR calc non Af Amer: 54 mL/min — ABNORMAL LOW (ref >60)
GLUCOSE: 127 mg/dL — AB (ref 65–99)
POTASSIUM: 4.8 mmol/L (ref 3.5–5.1)
SODIUM: 135 mmol/L (ref 135–145)
TOTAL PROTEIN: 5.8 g/dL — AB (ref 6.5–8.1)
Total Bilirubin: 0.3 mg/dL (ref 0.3–1.2)

## 2016-06-09 LAB — GRAM STAIN

## 2016-06-09 LAB — CBC
HEMATOCRIT: 22.8 % — AB (ref 39.0–52.0)
HEMOGLOBIN: 7.3 g/dL — AB (ref 13.0–17.0)
MCH: 30.2 pg (ref 26.0–34.0)
MCHC: 32 g/dL (ref 30.0–36.0)
MCV: 94.2 fL (ref 78.0–100.0)
Platelets: 222 10*3/uL (ref 150–400)
RBC: 2.42 MIL/uL — ABNORMAL LOW (ref 4.22–5.81)
RDW: 21.6 % — ABNORMAL HIGH (ref 11.5–15.5)
WBC: 11.9 10*3/uL — ABNORMAL HIGH (ref 4.0–10.5)

## 2016-06-09 LAB — BODY FLUID CELL COUNT WITH DIFFERENTIAL
Eos, Fluid: 1 %
LYMPHS FL: 10 %
MONOCYTE-MACROPHAGE-SEROUS FLUID: 32 % — AB (ref 50–90)
NEUTROPHIL FLUID: 57 % — AB (ref 0–25)
WBC FLUID: UNDETERMINED uL (ref 0–1000)

## 2016-06-09 LAB — GLUCOSE, SEROUS FLUID: Glucose, Fluid: 52 mg/dL

## 2016-06-09 LAB — LACTATE DEHYDROGENASE, PLEURAL OR PERITONEAL FLUID: LD, Fluid: 742 U/L — ABNORMAL HIGH (ref 3–23)

## 2016-06-09 LAB — PROTEIN, BODY FLUID: Total protein, fluid: 4 g/dL

## 2016-06-09 MED ORDER — HYDROCOD POLST-CPM POLST ER 10-8 MG/5ML PO SUER
5.0000 mL | Freq: Two times a day (BID) | ORAL | Status: DC | PRN
Start: 2016-06-09 — End: 2016-06-13
  Administered 2016-06-09 – 2016-06-12 (×7): 5 mL via ORAL
  Filled 2016-06-09 (×8): qty 5

## 2016-06-09 MED ORDER — IPRATROPIUM-ALBUTEROL 0.5-2.5 (3) MG/3ML IN SOLN
3.0000 mL | Freq: Three times a day (TID) | RESPIRATORY_TRACT | Status: DC
  Administered 2016-06-10 – 2016-06-13 (×10): 3 mL via RESPIRATORY_TRACT
  Filled 2016-06-09 (×11): qty 3

## 2016-06-09 NOTE — Progress Notes (Signed)
Initial Nutrition Assessment  DOCUMENTATION CODES:   Not applicable  INTERVENTION:  Ensure Enlive po BID, each supplement provides 350 kcal and 20 grams of protein  snacks  NUTRITION DIAGNOSIS:   Inadequate oral intake related to cancer and cancer related treatments, acute illness as evidenced by severe depletion of body fat, meal completion < 50%.  GOAL:   Patient will meet greater than or equal to 90% of their needs  MONITOR:   PO intake, Supplement acceptance  REASON FOR ASSESSMENT:   Malnutrition Screening Tool    ASSESSMENT:   82 y.o. male with medical history significant of metastatic lung cancer and PE who presents to the hospital with worsening dyspnea. Admitted with PNA, sepsis, and pleural effusion    Met with pt in room today. Pt reports poor appetite since chemo treatments in October but appetite had improved until 2 weeks ago when pt starting coughing and feeling sick. Pt currently eating 50% meals and drinking one Ensure per day. Per chart, pt is weight stable. RD encouraged supplement and meal intake and discussed the importance of adequate protein intake. Pt noted to be taking two multivitamins. Spoke to RN who plans to discuss this with MD today.   Medications reviewed and include: lovenox, folic acid, remeron, MVI, Omega 3, oxycodone, protonix, miralax, senokot  Labs reviewed: Ca 7.4(L) adj. 8.76(L), Alb 2.3(L) Wbc- 11.9(H), Hgb 7.3(L), Hct 22.8(L)  Nutrition-Focused physical exam completed. Findings are moderate fat depletion, no muscle depletion, and no edema.   Diet Order:  Diet regular Room service appropriate? Yes; Fluid consistency: Thin  Skin:  Reviewed, no issues  Last BM:  1/22  Height:   Ht Readings from Last 1 Encounters:  06/08/16 _0  (1.854 m)    Weight:   Wt Readings from Last 1 Encounters:  06/08/16 181 lb (82.1 kg)    Ideal Body Weight:  83.6 kg  BMI:  Body mass index is 23.88 kg/m.  Estimated Nutritional Needs:    Kcal:  2300-2600kcal/day   Protein:  115-131g/day   Fluid:  >2.3L/day   EDUCATION NEEDS:   No education needs identified at this time  Koleen Distance, RD, LDN Pager #[phone]

## 2016-06-09 NOTE — Procedures (Signed)
Ultrasound-guided diagnostic and therapeutic left thoracentesis performed yielding 2 liters of bloody colored fluid. No immediate complications. Follow-up chest x-ray pending.       Evyn Putzier E 2:44 PM 06/09/2016

## 2016-06-09 NOTE — Care Management Note (Signed)
Case Management Note  Patient Details  Name: TARVARES LANT MRN: [id number] Date of Birth: 1935-03-14  Subjective/Objective:81 y/o m admitted w/PNA. From home. CM referral-PT cons-await recc.                    Action/Plan:d/c plan home.   Expected Discharge Date:  06/11/16               Expected Discharge Plan:  [place]  In-House Referral:     Discharge planning Services  CM Consult  Post Acute Care Choice:    Choice offered to:     DME Arranged:    DME Agency:     HH Arranged:    [place] Agency:     Status of Service:  In process, will continue to follow  If discussed at Long Length of Stay Meetings, dates discussed:    Additional Comments:  Dessa Phi, RN 06/09/2016, 11:45 AM

## 2016-06-09 NOTE — Progress Notes (Signed)
MEDICATION-RELATED CONSULT NOTE   IR Procedure Consult - Anticoagulant/Antiplatelet PTA/Inpatient Med List Review by Pharmacist    Procedure: thoracentesis    Completed: 1/22 at 14:45  Post-Procedural bleeding risk per IR MD assessment:  Low risk per [facility] guideline  Antithrombotic medications on inpatient or PTA profile prior to procedure:   Enoxaparin '80mg'$  SQ q12h    Recommended restart time per IR Post-Procedure Guidelines:  4h after procedure   Other considerations:    Post-thoracentesis CXR: no pneumothorax  Plan:      Change enoxaparin administration time to 8p to give at least 4h from time   Doreene Eland, PharmD, BCPS.   Pager: [phone] 06/09/2016 4:43 PM

## 2016-06-09 NOTE — Progress Notes (Signed)
PROGRESS NOTE    JEANCARLOS MARCHENA  XIP:[id number]  DOB: Dec 24, 1934  DOA: 06/08/2016 PCP: Garret Reddish, MD   Hospital course:  Tim Ross is a 82 y.o. male with medical history significant of metastatic lung cancer and PE who presents to the hospital with worsening dyspnea. For last 4 days patient has been developing worsening dyspnea to the point where he is dyspneic with minimal efforts. His dyspnea is moderate to severe intensity, worse with exertion it has been associated with cough which has been productive with clear phlegm, wheezing, no fevers or chills. Positive generalized malaise and decreased energy. There seems to be no improvement factors to his dyspnea..  Assessment & Plan:   1. Sepsis, due to postobstructive pneumonia, present on admission. Patient will be admitted to the medical floor, he will placed on levofloxacin intravenously for IV antibiotics. Isotonic fluids intravenously, follow-up cell count, cultures and temperature curve. Bronchodilator therapy with duonebs as needed.   2. Hypoxic respiratory failure. Acute. Probably a combination of malignancy, postobstructive pneumonia and worsening left pleural effusion. Will continue oximetry monitoring and supplemental oxygen per nasal cannula to target O2 sat duration more than 92%. Will plan for thoracentesis in the morning. Likely malignant pleural effusion.  3. Pulmonary embolism. Continue anticoagulation with Lovenox, withhold apixaban, until all invasive procedures have been performed. Continue oxygen supplementation.  4. Metastatic adenocarcinoma the lung. 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 Will continue, lorazepam and OxyContin/ oxycodone.  5. Depression. Continue mirtazapine  DVT prophylaxis: Anticoagulated with lovenox Code Status: Full  Family Communication: I spoke with patient's family at the bedside and all questions were addressed.  Disposition Plan: Home Consults called: I  notified Dr Irene Limbo (hem/onc) of patient's admission Admission status: Inpatient  Subjective: Pt reports that his cough has been nonproductive but cough syrup didn't help  Objective: Vitals:   06/08/16 1747 06/08/16 2116 06/09/16 0201 06/09/16 0442  BP:  131/60  138/70  Pulse: (!) 115 (!) 113  96  Resp: (!) '24 20  20  '$ Temp:  99.6 F (37.6 C)  98.6 F (37 C)  TempSrc:  Oral  Oral  SpO2: 92% 94% 96% 96%  Weight:      Height:        Intake/Output Summary (Last 24 hours) at 06/09/16 0812 Last data filed at 06/09/16 0700  Gross per 24 hour  Intake             1065 ml  Output                0 ml  Net             1065 ml   Filed Weights   06/08/16 1143  Weight: 82.1 kg (181 lb)    Exam:  Eyes: PERRL, lids and conjunctivae mild pale, no icterus.  ENMT: Mucous membranes are dry. Posterior pharynx clear of any exudate or lesions.Normal dentition.  Neck: normal, supple, no masses, no thyromegaly Respiratory: decreased breath sounds bilaterally, poor inspiratory effort, no wheezing, but scattered crackles, no rhonchi.   Cardiovascular: Regular rate and rhythm, no murmurs / rubs / gallops. Right lower extremity edema ++ pitting. 2+ pedal pulses. No carotid bruits.  Abdomen: no tenderness, no masses palpated. No hepatosplenomegaly. Bowel sounds positive.  Musculoskeletal: no clubbing / cyanosis. No joint deformity upper and lower extremities.  Normal muscle tone.  Skin: no rashes, lesions, ulcers. No induration Neurologic: CN 2-12 grossly intact.  Data Reviewed: Basic Metabolic Panel:  Recent Labs Lab  06/03/16 1241 06/08/16 1220 06/09/16 0519  NA 135* 132* 135  K 4.5 4.8 4.8  CL  --  102 106  CO2 20* 24 22  GLUCOSE 172* 113* 127*  BUN 22.'5 19 19  '$ CREATININE 1.3 1.33* 1.23  CALCIUM 8.9 7.4* 7.4*   Liver Function Tests:  Recent Labs Lab 06/03/16 1241 06/08/16 1220 06/09/16 0519  AST 36* 36 36  ALT 40 37 34  ALKPHOS 77 73 63  BILITOT 0.22 0.6 0.3  PROT 6.9 6.6  5.8*  ALBUMIN 2.6* 2.5* 2.3*   No results for input(s): LIPASE, AMYLASE in the last 168 hours. No results for input(s): AMMONIA in the last 168 hours. CBC:  Recent Labs Lab 06/03/16 1240 06/08/16 1220 06/09/16 0519  WBC 7.7 15.9* 11.9*  NEUTROABS 4.9 11.3*  --   HGB 8.8* 8.3* 7.3*  HCT 26.9* 26.4* 22.8*  MCV 91.5 93.3 94.2  PLT 386 262 222   Cardiac Enzymes:  Recent Labs Lab 06/08/16 1220  TROPONINI <0.03   CBG (last 3)  No results for input(s): GLUCAP in the last 72 hours. No results found for this or any previous visit (from the past 240 hour(s)).   Studies: Dg Chest 2 View  Result Date: 06/08/2016 CLINICAL DATA:  Lung cancer in shortness of breath, worsening recently. EXAM: CHEST  2 VIEW COMPARISON:  Radiography 03/09/2016.  CT 05/27/2016. FINDINGS: Heart size is normal. There is worsening of patchy attic spread of cancer throughout the left lung. Moderate size right effusion with atelectasis in the right lower lung. Question if there could be developing interstitial spread in the right perihilar region now. Bone island in the right fourth rib again noted. IMPRESSION: Worsened interstitial spread of cancer within the left lung. Left effusion with left base atelectasis. Question early development of perihilar interstitial spread on the right. Certainly, coexistent infectious pneumonia in the left lung is not excluded. Electronically Signed   By: Nelson Chimes M.D.   On: 06/08/2016 12:43     Scheduled Meds: . chlorhexidine  15 mL Mouth Rinse BID  . enoxaparin (LOVENOX) injection  80 mg Subcutaneous Q12H  . feeding supplement (ENSURE ENLIVE)  237 mL Oral BID BM  . folic acid  1 mg Oral Daily  . ipratropium-albuterol  3 mL Nebulization Q6H  . levofloxacin (LEVAQUIN) IV  750 mg Intravenous Q24H  . mouth rinse  15 mL Mouth Rinse q12n4p  . mirtazapine  15 mg Oral QHS  . multivitamin  1 tablet Oral BID  . multivitamin with minerals  1 tablet Oral Daily  . omega-3 acid ethyl  esters  1 g Oral Daily  . oxyCODONE  10 mg Oral Q12H  . pantoprazole  40 mg Oral Daily  . polyethylene glycol  17 g Oral Daily  . senna-docusate  2 tablet Oral BID  . sodium chloride flush  3 mL Intravenous Q12H   Continuous Infusions: . dextrose 5 % and 0.9% NaCl 75 mL/hr at 06/09/16 0449    Active Problems:   Pneumonia  Time spent:   Irwin Brakeman, MD, FAAFP [facility] Pager [phone]  If 7PM-7AM, please contact night-coverage [email or web address] Password [email or web address] 06/09/2016, 8:12 AM    LOS: 1 day

## 2016-06-10 ENCOUNTER — Ambulatory Visit: Payer: Medicare Other

## 2016-06-10 ENCOUNTER — Telehealth: Payer: Self-pay | Admitting: *Deleted

## 2016-06-10 ENCOUNTER — Ambulatory Visit
Admit: 2016-06-10 | Discharge: 2016-06-10 | Disposition: A | Discharge status: Home / Self Care | Payer: Medicare Other | Attending: Radiation Oncology | Admitting: Radiation Oncology

## 2016-06-10 ENCOUNTER — Ambulatory Visit (HOSPITAL_COMMUNITY): Admission: RE | Admit: 2016-06-10 | Payer: Medicare Other | Source: Ambulatory Visit

## 2016-06-10 ENCOUNTER — Inpatient Hospital Stay (HOSPITAL_COMMUNITY): Payer: Medicare Other

## 2016-06-10 DIAGNOSIS — J90 Pleural effusion, not elsewhere classified: Secondary | ICD-10-CM

## 2016-06-10 DIAGNOSIS — E860 Dehydration: Secondary | ICD-10-CM

## 2016-06-10 DIAGNOSIS — C7970 Secondary malignant neoplasm of unspecified adrenal gland: Secondary | ICD-10-CM

## 2016-06-10 DIAGNOSIS — D649 Anemia, unspecified: Secondary | ICD-10-CM

## 2016-06-10 DIAGNOSIS — C3492 Malignant neoplasm of unspecified part of left bronchus or lung: Secondary | ICD-10-CM

## 2016-06-10 DIAGNOSIS — J9602 Acute respiratory failure with hypercapnia: Secondary | ICD-10-CM

## 2016-06-10 DIAGNOSIS — I2699 Other pulmonary embolism without acute cor pulmonale: Secondary | ICD-10-CM

## 2016-06-10 DIAGNOSIS — G893 Neoplasm related pain (acute) (chronic): Secondary | ICD-10-CM

## 2016-06-10 DIAGNOSIS — F329 Major depressive disorder, single episode, unspecified: Secondary | ICD-10-CM

## 2016-06-10 DIAGNOSIS — C7951 Secondary malignant neoplasm of bone: Secondary | ICD-10-CM

## 2016-06-10 DIAGNOSIS — E46 Unspecified protein-calorie malnutrition: Secondary | ICD-10-CM

## 2016-06-10 DIAGNOSIS — R5383 Other fatigue: Secondary | ICD-10-CM

## 2016-06-10 LAB — BASIC METABOLIC PANEL
ANION GAP: 6 (ref 5–15)
BUN: 17 mg/dL (ref 6–20)
CO2: 24 mmol/L (ref 22–32)
Calcium: 7.6 mg/dL — ABNORMAL LOW (ref 8.9–10.3)
Chloride: 106 mmol/L (ref 101–111)
Creatinine, Ser: 1.35 mg/dL — ABNORMAL HIGH (ref 0.61–1.24)
GFR, EST AFRICAN AMERICAN: 56 mL/min — AB (ref >60)
GFR, EST NON AFRICAN AMERICAN: 48 mL/min — AB (ref >60)
Glucose, Bld: 122 mg/dL — ABNORMAL HIGH (ref 65–99)
POTASSIUM: 4.8 mmol/L (ref 3.5–5.1)
Sodium: 136 mmol/L (ref 135–145)

## 2016-06-10 LAB — PREPARE RBC (CROSSMATCH)

## 2016-06-10 LAB — CBC
HCT: 21.4 % — ABNORMAL LOW (ref 39.0–52.0)
HEMOGLOBIN: 6.9 g/dL — AB (ref 13.0–17.0)
MCH: 29.7 pg (ref 26.0–34.0)
MCHC: 32.2 g/dL (ref 30.0–36.0)
MCV: 92.2 fL (ref 78.0–100.0)
Platelets: 219 10*3/uL (ref 150–400)
RBC: 2.32 MIL/uL — AB (ref 4.22–5.81)
RDW: 21.7 % — ABNORMAL HIGH (ref 11.5–15.5)
WBC: 9.6 10*3/uL (ref 4.0–10.5)

## 2016-06-10 MED ORDER — LORAZEPAM 0.5 MG PO TABS
0.5000 mg | ORAL_TABLET | Freq: Once | ORAL | Status: AC | PRN
  Administered 2016-06-10: 0.5 mg via ORAL
  Filled 2016-06-10: qty 1

## 2016-06-10 MED ORDER — ENOXAPARIN SODIUM 40 MG/0.4ML ~~LOC~~ SOLN
40.0000 mg | SUBCUTANEOUS | Status: DC
  Administered 2016-06-11: 40 mg via SUBCUTANEOUS
  Filled 2016-06-10: qty 0.4

## 2016-06-10 MED ORDER — DIPHENHYDRAMINE HCL 25 MG PO CAPS
25.0000 mg | ORAL_CAPSULE | Freq: Once | ORAL | Status: AC
  Administered 2016-06-10: 25 mg via ORAL
  Filled 2016-06-10: qty 1

## 2016-06-10 MED ORDER — ACETAMINOPHEN 325 MG PO TABS
650.0000 mg | ORAL_TABLET | Freq: Once | ORAL | Status: AC
  Administered 2016-06-10: 650 mg via ORAL
  Filled 2016-06-10: qty 2

## 2016-06-10 MED ORDER — SODIUM CHLORIDE 0.9 % IV SOLN
Freq: Once | INTRAVENOUS | Status: AC
  Administered 2016-06-10: 12:00:00 via INTRAVENOUS

## 2016-06-10 MED ORDER — GADOBENATE DIMEGLUMINE 529 MG/ML IV SOLN: 17.0000 mL | Freq: Once | INTRAVENOUS | Status: DC | PRN

## 2016-06-10 NOTE — Telephone Encounter (Signed)
appt scheduled for 1/29 per desk RN, notified desk RN of appts. Patient currently admitted to hospital

## 2016-06-10 NOTE — Progress Notes (Signed)
PROGRESS NOTE    Tim Ross  HBZ:[id number]  DOB: [birth year]  DOA: 06/08/2016 PCP: Garret Reddish, MD   Hospital course:  Tim Ross is a 82 y.o. male with medical history significant of metastatic lung cancer and PE who presents to the hospital with worsening dyspnea. For last 4 days patient has been developing worsening dyspnea to the point where he is dyspneic with minimal efforts. His dyspnea is moderate to severe intensity, worse with exertion it has been associated with cough which has been productive with clear phlegm, wheezing, no fevers or chills. Positive generalized malaise and decreased energy. There seems to be no improvement factors to his dyspnea..  Assessment & Plan:   1. Sepsis, due to postobstructive pneumonia, present on admission. Patient will be admitted to the medical floor, he will placed on levofloxacin intravenously for IV antibiotics. Isotonic fluids intravenously, follow-up cell count, cultures and temperature curve. Bronchodilator therapy with duonebs as needed.   2. Hypoxic respiratory failure. Acute. Improving.  Probably a combination of malignancy, postobstructive pneumonia and worsening left pleural effusion. Will continue oximetry monitoring and supplemental oxygen per nasal cannula to target O2 sat duration more than 92%. S/p thoracentesis 1/22 with 2 L removed.  Likely malignant pleural effusion.  3. Pulmonary embolism. Continue anticoagulation with Lovenox, withhold apixaban, until all invasive procedures have been performed. Continue oxygen supplementation.  4. Metastatic adenocarcinoma the lung. 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 Will continue, lorazepam and OxyContin/ oxycodone. Pt is scheduled to have MRI brain today to evaluate suspicious brain lesion.    5. Depression. Continue mirtazapine  6. Anemia - multifactorial - Hg down with hydration, transfuse 2 units PRBC, recheck in AM.   DVT prophylaxis:  Anticoagulated with lovenox Code Status: Full  Family Communication: I spoke with patient's family at the bedside and all questions were addressed.  Disposition Plan: Home Consults called: I notified Dr Irene Limbo (hem/onc) of patient's admission Admission status: Inpatient  Subjective: Pt reports that he is feeling better today than he felt yesterday.    Objective: Vitals:   06/09/16 1956 06/09/16 2051 06/09/16 2124 06/10/16 0445  BP: 138/84   109/64  Pulse: (!) 114   98  Resp: 17   18  Temp: (!) 100.5 F (38.1 C)  99.4 F (37.4 C) 97.9 F (36.6 C)  TempSrc: Oral  Oral Oral  SpO2: 95% 93%  93%  Weight:      Height:        Intake/Output Summary (Last 24 hours) at 06/10/16 0819 Last data filed at 06/10/16 0700  Gross per 24 hour  Intake             1635 ml  Output              150 ml  Net             1485 ml   Filed Weights   06/08/16 1143  Weight: 82.1 kg (181 lb)   Exam:  Eyes: PERRL, lids and conjunctivae mild pale, no icterus.  ENMT: Mucous membranes are dry. Posterior pharynx clear of any exudate or lesions.Normal dentition.  Neck: normal, supple, no masses, no thyromegaly Respiratory: decreased breath sounds bilaterally, poor inspiratory effort, no wheezing, but scattered crackles, no rhonchi.   Cardiovascular: Regular rate and rhythm, no murmurs / rubs / gallops. Right lower extremity edema ++ pitting. 2+ pedal pulses. No carotid bruits.  Abdomen: no tenderness, no masses palpated. No hepatosplenomegaly. Bowel sounds positive.  Musculoskeletal: no clubbing /  cyanosis. No joint deformity upper and lower extremities.  Normal muscle tone.  Skin: no rashes, lesions, ulcers. No induration Neurologic: CN 2-12 grossly intact.  Data Reviewed: Basic Metabolic Panel:  Recent Labs Lab 06/03/16 1241 06/08/16 1220 06/09/16 0519 06/10/16 0528  NA 135* 132* 135 136  K 4.5 4.8 4.8 4.8  CL  --  102 106 106  CO2 20* '24 22 24  '$ GLUCOSE 172* 113* 127* 122*  BUN 22.'5 19 19 17   '$ CREATININE 1.3 1.33* 1.23 1.35*  CALCIUM 8.9 7.4* 7.4* 7.6*   Liver Function Tests:  Recent Labs Lab 06/03/16 1241 06/08/16 1220 06/09/16 0519  AST 36* 36 36  ALT 40 37 34  ALKPHOS 77 73 63  BILITOT 0.22 0.6 0.3  PROT 6.9 6.6 5.8*  ALBUMIN 2.6* 2.5* 2.3*   No results for input(s): LIPASE, AMYLASE in the last 168 hours. No results for input(s): AMMONIA in the last 168 hours. CBC:  Recent Labs Lab 06/03/16 1240 06/08/16 1220 06/09/16 0519 06/10/16 0528  WBC 7.7 15.9* 11.9* 9.6  NEUTROABS 4.9 11.3*  --   --   HGB 8.8* 8.3* 7.3* 6.9*  HCT 26.9* 26.4* 22.8* 21.4*  MCV 91.5 93.3 94.2 92.2  PLT 386 262 222 219   Cardiac Enzymes:  Recent Labs Lab 06/08/16 1220  TROPONINI <0.03   CBG (last 3)  No results for input(s): GLUCAP in the last 72 hours. Recent Results (from the past 240 hour(s))  Gram stain     Status: None   Collection Time: 06/09/16  3:24 PM  Result Value Ref Range Status   Specimen Description FLUID LEFT PLEURAL  Final   Special Requests NONE  Final   Gram Stain   Final    ABUNDANT WBC PRESENT,BOTH PMN AND MONONUCLEAR NO ORGANISMS SEEN Performed at [facility], [address], [place], [place] [address]    Report Status 06/09/2016 FINAL  Final     Studies: Dg Chest 1 View  Result Date: 06/09/2016 CLINICAL DATA:  Status post left thoracentesis. EXAM: CHEST 1 VIEW COMPARISON:  06/09/2016. Chest CT dated 05/27/2016. PET-CT dated 03/05/2016. FINDINGS: Normal sized heart. Mildly decreased left pleural fluid. Further increase in interstitial opacity in the left lung. No pneumothorax. The previously demonstrated right fourth posterior rib bone island is partially obscured by no and overlying lead. Patchy sclerosis and lucency in the right humeral head with progressive sclerosis. IMPRESSION: 1. No pneumothorax following left thoracentesis. 2. Mildly decreased left pleural fluid. 3. Further progression of interstitial spread of lung cancer on the left.  4. Increased sclerosis associated with the previously demonstrated right humeral head metastasis. Electronically Signed   By: Claudie Revering M.D.   On: 06/09/2016 15:22   Dg Chest 2 View  Result Date: 06/08/2016 CLINICAL DATA:  Lung cancer in shortness of breath, worsening recently. EXAM: CHEST  2 VIEW COMPARISON:  Radiography 03/09/2016.  CT 05/27/2016. FINDINGS: Heart size is normal. There is worsening of patchy attic spread of cancer throughout the left lung. Moderate size right effusion with atelectasis in the right lower lung. Question if there could be developing interstitial spread in the right perihilar region now. Bone island in the right fourth rib again noted. IMPRESSION: Worsened interstitial spread of cancer within the left lung. Left effusion with left base atelectasis. Question early development of perihilar interstitial spread on the right. Certainly, coexistent infectious pneumonia in the left lung is not excluded. Electronically Signed   By: Nelson Chimes M.D.   On:  06/08/2016 12:43   US Thoracentesis Asp Pleural Space W/img Guide  Result Date: 06/09/2016 INDICATION: Patient with a history of lung cancer with metastatic disease. Left pleural effusion noted on recent imaging. Request is made for diagnostic and therapeutic thoracentesis. EXAM: ULTRASOUND GUIDED DIAGNOSTIC AND THERAPEUTIC THORACENTESIS MEDICATIONS: 1% lidocaine COMPLICATIONS: None immediate. PROCEDURE: An ultrasound guided thoracentesis was thoroughly discussed with the patient and questions answered. The benefits, risks, alternatives and complications were also discussed. The patient understands and wishes to proceed with the procedure. Written consent was obtained. Ultrasound was performed to localize and mark an adequate pocket of fluid in the left chest. The area was then prepped and draped in the normal sterile fashion. 1% Lidocaine was used for local anesthesia. Under ultrasound guidance a Safe-T-Centesis catheter was  introduced. Thoracentesis was performed. The catheter was removed and a dressing applied. FINDINGS: A total of approximately 2 L of bloody fluid was removed. Samples were sent to the laboratory as requested by the clinical team. IMPRESSION: Successful ultrasound guided left thoracentesis yielding 2 L of pleural fluid. Read by: Saverio Danker, PA-C Electronically Signed   By: Lucrezia Europe M.D.   On: 06/09/2016 15:41   Scheduled Meds: . sodium chloride   Intravenous Once  . acetaminophen  650 mg Oral Once  . chlorhexidine  15 mL Mouth Rinse BID  . diphenhydrAMINE  25 mg Oral Once  . enoxaparin (LOVENOX) injection  80 mg Subcutaneous Q12H  . feeding supplement (ENSURE ENLIVE)  237 mL Oral BID BM  . folic acid  1 mg Oral Daily  . ipratropium-albuterol  3 mL Nebulization TID  . levofloxacin (LEVAQUIN) IV  750 mg Intravenous Q24H  . mouth rinse  15 mL Mouth Rinse q12n4p  . mirtazapine  15 mg Oral QHS  . multivitamin  1 tablet Oral BID  . multivitamin with minerals  1 tablet Oral Daily  . omega-3 acid ethyl esters  1 g Oral Daily  . oxyCODONE  10 mg Oral Q12H  . pantoprazole  40 mg Oral Daily  . polyethylene glycol  17 g Oral Daily  . senna-docusate  2 tablet Oral BID  . sodium chloride flush  3 mL Intravenous Q12H   Continuous Infusions: . dextrose 5 % and 0.9% NaCl 60 mL/hr at 06/09/16 2148   Active Problems:   Pneumonia  Time spent:   Irwin Brakeman, MD, FAAFP [facility] Pager [phone]  If 7PM-7AM, please contact night-coverage [email or web address] Password [email or web address] 06/10/2016, 8:18 AM    LOS: 2 days

## 2016-06-10 NOTE — Telephone Encounter (Signed)
MRI called reporting this patient is currently an inpatient MRI that is ordered cannot be done.  Dr. Irene Limbo can see patient as consult or talk with Hospitalist.  We have plenty of room to do this today if needed."   [facility] room 1403-01.

## 2016-06-10 NOTE — Progress Notes (Signed)
CRITICAL VALUE ALERT  Critical value received:  Hgb 6.9  Date of notification:  06/10/16  Time of notification:  06:50  Critical value read back:Yes.    Nurse who received alert:  Ruben Im, RN  MD notified (1st page):  Baltazar Najjar  Time of first page:  06:55  MD notified (2nd page):  Time of second page:  Responding MD:  NP notified via text page. No return call, will continue to monitor patient.   Time MD responded:

## 2016-06-10 NOTE — Progress Notes (Signed)
PT Cancellation Note  Patient Details Name: Tim Ross MRN: [id number] DOB: 23-Dec-1934   Cancelled Treatment:    Reason Eval/Treat Not Completed: Medical issues which prohibited therapy--Hgb 6.9-pt awaiting blood transfusion. Will hold PT for now. Will check back another day/time.    Weston Anna, MPT Pager: [phone]

## 2016-06-10 NOTE — Progress Notes (Addendum)
Oncology Progress Note  DOS 06/10/2016   Patient was seen. Discussed current status with family. Left blood pleural effusion with drop in hemoglobin. PLAN -patient notes improvement in breathing -on Abx per hospital medicine for possible post-obstructive pneumonia -MRI brain today -we will reschedule him for starting his immunotherapy with Atezolizumab from Friday -I consulted and discussed with Dr Sondra Come regarding consideration of palliative RT to his dominant left lung mass and possibly to T10 lesion. -transfuse prn to maintain Hgb >8 -if concern with active bleeding into the pleural space might need to hold his anticoagulation or switch to prophylactic dose on lovenox -awaiting pleural fluid cytology. Mx of pleural fluid based on rapidity of reaccumulation. -will continue to follow  Sullivan Lone MD MS   .   HEMATOLOGY/ONCOLOGY INPATIENT PROGRESS NOTE  Date of Service: 06/11/2016  Inpatient Attending: .Debbe Odea, MD   Diagnosis:  1)Metastatic Lung Adenocarcinoma with mets to bone , adrenal glands and concern for early mets to brain. 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 Patient has been referred to [facility] for deep enteroscopy for possible ablation. Has appointment in December 19th 2017    Current Treatment:  -Carboplatin/Alimta (palliative chemotherapy).  Low PDL 1 expression at 20%. EGFR , ALK , ROS 1 and BRAF mutation negative based on [employer] testing   -Referred to radiation oncology for consideration of palliative radiation to the painful right humeral metastasis -will need to monitor early brain met and consider palliative RT if growing/symptomatic  SUBJECTIVE  Patient was admitted for increasing shortness of breath and to have possible postobstructive pneumonia and new  pleural effusion. Patient notes his breathing is much improved after removing 2 L of bloody pleural fluid. He had a drop in his hemoglobin levels and is being transfused. He has been scheduled for his planned MRI of the brain this evening. Met with an updated the patient and his family at bedside and answered all their questions and went over all the results.   OBJECTIVE: NAD  PHYSICAL EXAMINATION: . Vitals:   06/10/16 1927 06/10/16 1945 06/10/16 2105 06/11/16 0459  BP:  127/71 (!) 151/80 119/69  Pulse:  (!) 118 (!) 137 (!) 115  Resp:   (!) 22 20  Temp:  99 F (37.2 C) 97.8 F (36.6 C) 98.1 F (36.7 C)  TempSrc:  Oral Oral Oral  SpO2: 95% 94% 95% 95%  Weight:      Height:       Filed Weights   06/08/16 1143  Weight: 181 lb (82.1 kg)   .Body mass index is 23.88 kg/m.  GENERAL:alert, in no acute distress and comfortable SKIN: pale appearing EYES: conjunctival pallor. OROPHARYNX:no exudate, no erythema and lips, buccal mucosa, and tongue normal  NECK: supple, no JVD, thyroid normal size, non-tender, without nodularity LYMPH:  no palpable lymphadenopathy in the cervical, axillary or inguinal LUNGS: Some decreased air entry left lung base with a few rales on the left side HEART: regular rate & rhythm,  no murmurs and no lower extremity edema ABDOMEN: abdomen soft, non-tender, normoactive bowel sounds  Musculoskeletal: no cyanosis of digits and no clubbing  PSYCH: alert & oriented x 3 with fluent speech NEURO: no focal motor/sensory deficits  MEDICAL HISTORY:  Past Medical History:  Diagnosis Date  . Cancer ([place])   . Colonic  polyp   . GERD (gastroesophageal reflux disease)   . History of radiation therapy 04/07/16-04/23/16   right humerus 30 Gy in 10 fractions  . Hyperlipidemia   . Hypertension   . Muscular degeneration   . Pneumonia 2002  . Testicular cyst    removed-noncancerous    SURGICAL HISTORY: Past Surgical History:  Procedure Laterality Date  . CATARACT  EXTRACTION     right side  . COLONOSCOPY N/A 03/14/2016   Procedure: COLONOSCOPY;  Surgeon: Doran Stabler, MD;  Location: [facility] END[facility]COPY;  Service: Endoscopy;  Laterality: N/A;  . ESOPHAGOGASTRODUODENOSCOPY N/A 03/11/2016   Procedure: ESOPHAGOGASTRODUODENOSCOPY (EGD);  Surgeon: Doran Stabler, MD;  Location: [facility];  Service: Endoscopy;  Laterality: N/A;  . GIVENS CAPSULE STUDY N/A 03/14/2016   Procedure: GIVENS CAPSULE STUDY;  Surgeon: Doran Stabler, MD;  Location: [facility];  Service: Endoscopy;  Laterality: N/A;  . KNEE SURGERY     scope on both  . LUMBAR LAMINECTOMY    . TRANSURETHRAL RESECTION OF PROSTATE     resolved issues    SOCIAL HISTORY: Social History   Social History  . Marital status: Married    Spouse name: N/A  . Number of children: 4  . Years of education: N/A   Occupational History  . worked at [employer]  . Smoking status: Former Smoker    Packs/day: 1.50    Years: 40.00    Types: Cigarettes    Quit date: 12/30/1990  . Smokeless tobacco: Never Used  . Alcohol use No  . Drug use: No  . Sexual activity: Not on file   Other Topics Concern  . Not on file   Social History Narrative   Married (2nd marriage-79). No children. Dog.       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       Hobbies: tv, walks daily or every other day    FAMILY HISTORY: Family History  Problem Relation Age of Onset  . Diabetes Mother   . Hypertension Mother   . Heart attack Father     late 44s, former smoker  . Heart disease Brother     47s, smokers  . Colon cancer Sister     ALLERGIES:  is allergic to celebrex [celecoxib].  MEDICATIONS:  Scheduled Meds: . chlorhexidine  15 mL Mouth Rinse BID  . enoxaparin (LOVENOX) injection  40 mg Subcutaneous Q24H  . feeding supplement (ENSURE ENLIVE)  237 mL Oral BID BM  . folic acid  1 mg Oral Daily  . ipratropium-albuterol  3 mL Nebulization TID  .  levofloxacin (LEVAQUIN) IV  750 mg Intravenous Q24H  . mouth rinse  15 mL Mouth Rinse q12n4p  . mirtazapine  15 mg Oral QHS  . multivitamin  1 tablet Oral BID  . omega-3 acid ethyl esters  1 g Oral Daily  . oxyCODONE  10 mg Oral Q12H  . pantoprazole  40 mg Oral Daily  . polyethylene glycol  17 g Oral Daily  . senna-docusate  2 tablet Oral BID  . sodium chloride flush  3 mL Intravenous Q12H   Continuous Infusions: PRN Meds:.acetaminophen **OR** acetaminophen, chlorpheniramine-HYDROcodone, gadobenate dimeglumine, ipratropium-albuterol, LORazepam, ondansetron **OR** ondansetron (ZOFRAN) IV, oxyCODONE  REVIEW OF SYSTEMS:    10 Point review of Systems was done is negative except as noted above.   LABORATORY DATA:  I have reviewed the data as listed  . CBC Latest Ref  Rng & Units 06/10/2016 06/10/2016  WBC 4.0 - 10.5 K/uL - 9.6  Hemoglobin 13.0 - 17.0 g/dL 9.1(L) 6.9(LL)  Hematocrit 39.0 - 52.0 % 27.5(L) 21.4(L)  Platelets 150 - 400 K/uL - 219    . CMP Latest Ref Rng & Units 06/11/2016 06/10/2016  Glucose 65 - 99 mg/dL 114(H) 122(H)  BUN 6 - 20 mg/dL 16 17  Creatinine 0.61 - 1.24 mg/dL 1.27(H) 1.35(H)  Sodium 135 - 145 mmol/L 133(L) 136  Potassium 3.5 - 5.1 mmol/L 5.8(H) 4.8  Chloride 101 - 111 mmol/L 104 106  CO2 22 - 32 mmol/L 22 24  Calcium 8.9 - 10.3 mg/dL 7.8(L) 7.6(L)  Total Protein 6.5 - 8.1 g/dL - -  Total Bilirubin 0.3 - 1.2 mg/dL - -  Alkaline Phos 38 - 126 U/L - -  AST 15 - 41 U/L - -  ALT 17 - 63 U/L - -     RADIOGRAPHIC STUDIES: I have personally reviewed the radiological images as listed and agreed with the findings in the report. Dg Chest 1 View  Result Date: 06/09/2016 CLINICAL DATA:  Status post left thoracentesis. EXAM: CHEST 1 VIEW COMPARISON:  06/09/2016. Chest CT dated 05/27/2016. PET-CT dated 03/05/2016. FINDINGS: Normal sized heart. Mildly decreased left pleural fluid. Further increase in interstitial opacity in the left lung. No pneumothorax. The  previously demonstrated right fourth posterior rib bone island is partially obscured by no and overlying lead. Patchy sclerosis and lucency in the right humeral head with progressive sclerosis. IMPRESSION: 1. No pneumothorax following left thoracentesis. 2. Mildly decreased left pleural fluid. 3. Further progression of interstitial spread of lung cancer on the left. 4. Increased sclerosis associated with the previously demonstrated right humeral head metastasis. Electronically Signed   By: Claudie Revering M.D.   On: 06/09/2016 15:22   Dg Chest 2 View  Result Date: 06/08/2016 CLINICAL DATA:  Lung cancer in shortness of breath, worsening recently. EXAM: CHEST  2 VIEW COMPARISON:  Radiography 03/09/2016.  CT 05/27/2016. FINDINGS: Heart size is normal. There is worsening of patchy attic spread of cancer throughout the left lung. Moderate size right effusion with atelectasis in the right lower lung. Question if there could be developing interstitial spread in the right perihilar region now. Bone island in the right fourth rib again noted. IMPRESSION: Worsened interstitial spread of cancer within the left lung. Left effusion with left base atelectasis. Question early development of perihilar interstitial spread on the right. Certainly, coexistent infectious pneumonia in the left lung is not excluded. Electronically Signed   By: Nelson Chimes M.D.   On: 06/08/2016 12:43   Ct Chest W Contrast  Result Date: 05/27/2016 CLINICAL DATA:  Left lung cancer staging with metastatic disease to bone adrenal glands. EXAM: CT CHEST, ABDOMEN, AND PELVIS WITH CONTRAST TECHNIQUE: Multidetector CT imaging of the chest, abdomen and pelvis was performed following the standard protocol during bolus administration of intravenous contrast. CONTRAST:  162m ISOVUE-300 IOPAMIDOL (ISOVUE-300) INJECTION 61% COMPARISON:  CT chest from 02/29/2016. Abdomen and pelvis CT from 11/08/2015. PET-CT from 03/05/2016. FINDINGS: CT CHEST FINDINGS  Cardiovascular: Heart size is normal. Stable appearance trace anterior pericardial fluid. Coronary artery calcification is noted. Atherosclerotic calcification is noted in the wall of the thoracic aorta. Mediastinum/Nodes: 8 mm short axis prevascular lymph node on image 28 series 2 is measured at 15 mm on the previous PET-CT. 12 mm short axis left hilar lymph node seen on image 30 was 18 mm when I remeasure it on the prior exam. 18  mm short axis subcarinal lymph node was 20 mm previously. No right hilar lymphadenopathy. Abnormal soft tissue attenuation in the left hilum is progressive in the interval. Lungs/Pleura: Persistent interlobular septal thickening and confluent airspace opacity is identified in the posterior left upper lobe, progressive in the interval. There is now a 5.7 x 6.9 cm masslike area of confluent airspace opacity, markedly progressive in the interval. This is in the region of hypermetabolism seen on the previous PET-CT. 11 mm posterior right upper lobe pulmonary nodule (image 77 series 7) is new in the interval. Scattered areas of peribronchovascular nodularity in the posterior right upper lobe suggest atypical infection. Musculoskeletal: Lytic lesions noted right humeral head. Compression deformity of an upper thoracic vertebral body is unchanged. 3.2 cm lytic lesion in the T10 vertebral body has progressed in the interval. CT ABDOMEN PELVIS FINDINGS Hepatobiliary: Scattered tiny hypodensities in the liver parenchyma were present previously are not substantially changed in the interval. These remain too small to characterize. Pancreas: No focal mass lesion. No dilatation of the main duct. No intraparenchymal cyst. No peripancreatic edema. Spleen: No splenomegaly. No focal mass lesion. Adrenals/Urinary Tract: Stable appearance 3.2 cm left adrenal lesion. Right adrenal gland remains normal in appearance. Cysts are identified in the kidneys bilaterally without appreciable interval change. No  evidence for hydroureter. The urinary bladder appears normal for the degree of distention. Stomach/Bowel: Stomach is nondistended. No gastric wall thickening. No evidence of outlet obstruction. Duodenum is normally positioned as is the ligament of Treitz. No small bowel wall thickening. No small bowel dilatation. The terminal ileum is normal. The appendix is normal. Diverticular changes are noted in the left colon without evidence of diverticulitis. Vascular/Lymphatic: There is no gastrohepatic or hepatoduodenal ligament lymphadenopathy. No intraperitoneal or retroperitoneal lymphadenopathy. There is abdominal aortic atherosclerosis without aneurysm. No pelvic sidewall lymphadenopathy. Reproductive: The prostate gland and seminal vesicles have normal imaging features. Other: No intraperitoneal free fluid. Musculoskeletal: No suspicious lytic or sclerotic bony abnormality in the abdomen and pelvis. IMPRESSION: 1. Marked interval progression of confluent airspace opacity in the parahilar left lung, highly suspicious for disease progression. This region was hypermetabolic on the previous PET-CT and appears more confluent than typically seen for post radiation change. 2. Evidence of decreasing size mediastinal lymphadenopathy. 3. Interval development of a small posterior right upper lobe pulmonary nodule. Continued attention on follow-up recommended. 4. Interval progression of the lucent lesion in T10, seen to be hypermetabolic on previous PET-CT. Lesions in the right humeral head are incompletely visualized on today's study. 5. No change 3.2 cm left adrenal lesion, indeterminate by CT imaging. 6. Interval progression small left pleural effusion. 7.  Abdominal Aortic Atherosclerois (ICD10-170.0) 8.  Emphysema. ([id number]-O97.9) Electronically Signed   By: Misty Stanley M.D.   On: 05/27/2016 13:14   Mr Brain Wo Contrast  Result Date: 06/10/2016 CLINICAL DATA:  Initial evaluation for history of metastatic lung cancer and  P with worsening dyspnea. EXAM: MRI HEAD WITHOUT CONTRAST TECHNIQUE: Multiplanar, multiecho pulse sequences of the brain and surrounding structures were obtained without intravenous contrast. COMPARISON:  Previous MRI from 03/10/2016. FINDINGS: Brain: Study is technically limited as the patient was unable to tolerate the full length of the exam. Only diffusion-weighted imaging, sagittal T1, with axial T2 and FLAIR sequences were performed. Additionally, images provided are somewhat degraded by motion artifact. Generalized age-related cerebral atrophy is stable from previous. Mild for age chronic microvascular ischemic changes also similar. No abnormal foci of restricted diffusion to suggest acute or subacute ischemia. Gray-white  matter differentiation maintained. No evidence for chronic infarction. No mass lesion, midline shift, or mass effect. Ventricles normal in size without evidence for hydrocephalus. Small right frontal hygroma is stable from previous. No imaging findings to suggest metastatic disease identified on this limited noncontrast examination. Pituitary gland and suprasellar region within normal limits. Vascular: Major intracranial vascular flow voids are maintained. Skull and upper cervical spine: Craniocervical junction normal. Degenerative thickening noted at the tectorial membrane without significant stenosis. Visualized upper cervical spine otherwise unremarkable. Bone marrow signal intensity within normal limits. No scalp soft tissue abnormality. Sinuses/Orbits: Globes and orbital soft tissues within normal limits. Patient is status post cataract extraction bilaterally. Mild scattered mucosal thickening within the ethmoidal air cells. Visualized paranasal sinuses are otherwise clear. No mastoid effusion. Inner ear structures grossly normal. IMPRESSION: 1. Technically limited study due to patient's inability to tolerate the full length of the exam and motion artifact. 2. No acute intracranial  process identified. No evidence for intracranial metastatic disease on this limited noncontrast examination. 3. Stable small right frontal hygroma. Electronically Signed   By: Jeannine Boga M.D.   On: 06/10/2016 22:13   Ct Abdomen Pelvis W Contrast  Result Date: 05/27/2016 CLINICAL DATA:  Left lung cancer staging with metastatic disease to bone adrenal glands. EXAM: CT CHEST, ABDOMEN, AND PELVIS WITH CONTRAST TECHNIQUE: Multidetector CT imaging of the chest, abdomen and pelvis was performed following the standard protocol during bolus administration of intravenous contrast. CONTRAST:  139m ISOVUE-300 IOPAMIDOL (ISOVUE-300) INJECTION 61% COMPARISON:  CT chest from 02/29/2016. Abdomen and pelvis CT from 11/08/2015. PET-CT from 03/05/2016. FINDINGS: CT CHEST FINDINGS Cardiovascular: Heart size is normal. Stable appearance trace anterior pericardial fluid. Coronary artery calcification is noted. Atherosclerotic calcification is noted in the wall of the thoracic aorta. Mediastinum/Nodes: 8 mm short axis prevascular lymph node on image 28 series 2 is measured at 15 mm on the previous PET-CT. 12 mm short axis left hilar lymph node seen on image 30 was 18 mm when I remeasure it on the prior exam. 18 mm short axis subcarinal lymph node was 20 mm previously. No right hilar lymphadenopathy. Abnormal soft tissue attenuation in the left hilum is progressive in the interval. Lungs/Pleura: Persistent interlobular septal thickening and confluent airspace opacity is identified in the posterior left upper lobe, progressive in the interval. There is now a 5.7 x 6.9 cm masslike area of confluent airspace opacity, markedly progressive in the interval. This is in the region of hypermetabolism seen on the previous PET-CT. 11 mm posterior right upper lobe pulmonary nodule (image 77 series 7) is new in the interval. Scattered areas of peribronchovascular nodularity in the posterior right upper lobe suggest atypical infection.  Musculoskeletal: Lytic lesions noted right humeral head. Compression deformity of an upper thoracic vertebral body is unchanged. 3.2 cm lytic lesion in the T10 vertebral body has progressed in the interval. CT ABDOMEN PELVIS FINDINGS Hepatobiliary: Scattered tiny hypodensities in the liver parenchyma were present previously are not substantially changed in the interval. These remain too small to characterize. Pancreas: No focal mass lesion. No dilatation of the main duct. No intraparenchymal cyst. No peripancreatic edema. Spleen: No splenomegaly. No focal mass lesion. Adrenals/Urinary Tract: Stable appearance 3.2 cm left adrenal lesion. Right adrenal gland remains normal in appearance. Cysts are identified in the kidneys bilaterally without appreciable interval change. No evidence for hydroureter. The urinary bladder appears normal for the degree of distention. Stomach/Bowel: Stomach is nondistended. No gastric wall thickening. No evidence of outlet obstruction. Duodenum is normally  positioned as is the ligament of Treitz. No small bowel wall thickening. No small bowel dilatation. The terminal ileum is normal. The appendix is normal. Diverticular changes are noted in the left colon without evidence of diverticulitis. Vascular/Lymphatic: There is no gastrohepatic or hepatoduodenal ligament lymphadenopathy. No intraperitoneal or retroperitoneal lymphadenopathy. There is abdominal aortic atherosclerosis without aneurysm. No pelvic sidewall lymphadenopathy. Reproductive: The prostate gland and seminal vesicles have normal imaging features. Other: No intraperitoneal free fluid. Musculoskeletal: No suspicious lytic or sclerotic bony abnormality in the abdomen and pelvis. IMPRESSION: 1. Marked interval progression of confluent airspace opacity in the parahilar left lung, highly suspicious for disease progression. This region was hypermetabolic on the previous PET-CT and appears more confluent than typically seen for post  radiation change. 2. Evidence of decreasing size mediastinal lymphadenopathy. 3. Interval development of a small posterior right upper lobe pulmonary nodule. Continued attention on follow-up recommended. 4. Interval progression of the lucent lesion in T10, seen to be hypermetabolic on previous PET-CT. Lesions in the right humeral head are incompletely visualized on today's study. 5. No change 3.2 cm left adrenal lesion, indeterminate by CT imaging. 6. Interval progression small left pleural effusion. 7.  Abdominal Aortic Atherosclerois (ICD10-170.0) 8.  Emphysema. ([id number]-T02.9) Electronically Signed   By: Misty Stanley M.D.   On: 05/27/2016 13:14   US Thoracentesis Asp Pleural Space W/img Guide  Result Date: 06/09/2016 INDICATION: Patient with a history of lung cancer with metastatic disease. Left pleural effusion noted on recent imaging. Request is made for diagnostic and therapeutic thoracentesis. EXAM: ULTRASOUND GUIDED DIAGNOSTIC AND THERAPEUTIC THORACENTESIS MEDICATIONS: 1% lidocaine COMPLICATIONS: None immediate. PROCEDURE: An ultrasound guided thoracentesis was thoroughly discussed with the patient and questions answered. The benefits, risks, alternatives and complications were also discussed. The patient understands and wishes to proceed with the procedure. Written consent was obtained. Ultrasound was performed to localize and mark an adequate pocket of fluid in the left chest. The area was then prepped and draped in the normal sterile fashion. 1% Lidocaine was used for local anesthesia. Under ultrasound guidance a Safe-T-Centesis catheter was introduced. Thoracentesis was performed. The catheter was removed and a dressing applied. FINDINGS: A total of approximately 2 L of bloody fluid was removed. Samples were sent to the laboratory as requested by the clinical team. IMPRESSION: Successful ultrasound guided left thoracentesis yielding 2 L of pleural fluid. Read by: Saverio Danker, PA-C Electronically  Signed   By: Lucrezia Europe M.D.   On: 06/09/2016 15:41    ASSESSMENT & PLAN:  82 year old male with  #1 Metastatic Lung Adenocarcinoma  With mets to bones, adrenal gland and possible very subtle early mets to brain. Low PDL 1 expression at 20%. EGFR , ALK , ROS 1 and BRAF mutation negative based on [employer] testing  Patient is s/p 2 cycle of Carboplatin/Alimta. #2 painful right humerus metastatic lesion requiring significant pain medications. Pain resolved with palliative RT Assessment CT CAP on 05/27/2016 shows signs of interval progression of left lung mass and T10 lesion. #3 increased shortness of breath with concerns of postobstructive pneumonia and tumor progression with left sided new pleural effusion Plan Patient was seen. Discussed current status with family. Left blood pleural effusion with drop in hemoglobin. -patient notes some improvement in breathing -on Abx per hospital medicine for possible post-obstructive pneumonia -MRI brain today -we will reschedule him for starting his immunotherapy with Atezolizumab from Friday -I consulted and discussed with Dr Sondra Come regarding consideration of palliative RT to his dominant left lung mass and  possibly to T10 lesion. -transfuse prn to maintain Hgb >8 -if concern with active bleeding into the pleural space might need to hold his anticoagulation or switch to prophylactic dose on lovenox -awaiting pleural fluid cytology. Mx of pleural fluid based on rapidity of reaccumulation. -will continue to follow   #4 recent GI bleeding due to small bowel AVM. No overt bleeding at this time. Hemoglobin stable. Plan -patient had follow-up at [facility] for consideration of deep enteroscopy to try to ablate his small bowel AVM - he was recommended against this. -no further overt GI bleeding noted at this time.  #4 pulmonary embolism related to metastatic malignancy. -switched from lovenox to Eliquis as pre patient choice.  #6  bony metastases-  rt shoulder pain resolved with Xgeva and RT and pain mx #7 adrenal metastases-no evidence of adrenal insufficiency at this time. Mets stable. Plan -continue Xgeva for skeletal metastases -completed palliative radiation to the right shoulder. - on OxyContin 10 mg by mouth now only at night time and continue oxycodone when necessary for breakthrough pain.  #8 protein calorie malnutrition  -Encouraged to maintain small frequent meals.  #9 fatigue and depression -Optimize nutrition -Try to ambulate at least 20 minutes daily . -Remeron has helped with mood and appetite -- will continue this.    Sullivan Lone MD [place] AAHIVMS [facility] Hematology/Oncology Physician [facility]  (Office):       [phone] (Work cell):  [phone] (Fax):           [phone]  06/11/2016 9:37 AM

## 2016-06-10 NOTE — Progress Notes (Signed)
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 The patient presented to the ED on 06/08/16 for shortness of breath. X-ray at the time showed a worsening spread of cancer in the left lung and left lung effusion with left base atelectasis. There was a question of developing interstitial spread in the right perihilar region. The patient underwent a therapeutic and diagnostic thoracentesis of the left lung on 06/09/16 with approximately 2 liters of bloody fluid was removed. Pathology is pending. Dr. Irene Limbo saw the patient and recommended palliative radiation to his enlarging left lung mass and metastatic disease to T10. Dr. Irene Limbo will also reschedule the patient for starting his immunotherapy with Atezolizumab from this Friday.  ALLERGIES:  is allergic to celebrex [celecoxib].  Meds: No current facility-administered medications for this encounter.    Current Outpatient Prescriptions  Medication Sig Dispense Refill  . ondansetron (ZOFRAN) 8 MG tablet Take 1 tablet (8 mg total) by mouth 2 (two) times daily as needed (Nausea or vomiting). 30 tablet 1  . prochlorperazine (COMPAZINE) 10 MG tablet Take 1 tablet (10 mg total) by mouth every 6 (six) hours as needed (Nausea or vomiting). 30 tablet 1   Facility-Administered Medications Ordered in Other Encounters  Medication Dose Route Frequency Provider Last Rate Last Dose  . acetaminophen (TYLENOL) tablet 650 mg  650 mg Oral Q6H PRN Tawni Millers, MD   650 mg at 06/09/16 2141   Or  . acetaminophen (TYLENOL) suppository 650  mg  650 mg Rectal Q6H PRN Tawni Millers, MD      . chlorhexidine (PERIDEX) 0.12 % solution 15 mL  15 mL Mouth Rinse BID Tawni Millers, MD   15 mL at 06/10/16 0941  . chlorpheniramine-HYDROcodone (TUSSIONEX) 10-8 MG/5ML suspension 5 mL  5 mL Oral Q12H PRN Clanford Marisa Hua, MD   5 mL at 06/10/16 1139  . [START ON 06/11/2016] enoxaparin (LOVENOX) injection 40 mg  40 mg Subcutaneous Q24H Clanford L Johnson, MD      . feeding supplement (ENSURE ENLIVE) (ENSURE ENLIVE) liquid 237 mL  237 mL Oral BID BM Tawni Millers, MD   237 mL at 06/10/16 1500  . folic acid (FOLVITE) tablet 1 mg  1 mg Oral Daily Mauricio Gerome Apley, MD   1 mg at 06/10/16 0941  . ipratropium-albuterol (DUONEB) 0.5-2.5 (3) MG/3ML nebulizer solution 3 mL  3 mL Nebulization Q4H PRN Tawni Millers, MD   3 mL at 06/08/16 1747  . ipratropium-albuterol (DUONEB) 0.5-2.5 (3) MG/3ML nebulizer solution 3 mL  3 mL Nebulization TID Clanford Marisa Hua, MD   3 mL at 06/10/16 1333  . levofloxacin (LEVAQUIN) IVPB 750 mg  750 mg Intravenous Q24H Anh P Pham, RPH   750 mg at 06/10/16 1439  . LORazepam (ATIVAN) tablet 0.5 mg  0.5 mg Oral Q8H PRN Tawni Millers, MD      . LORazepam (ATIVAN) tablet 0.5 mg  0.5 mg Oral Once PRN Clanford Marisa Hua, MD      . MEDLINE mouth rinse  15 mL Mouth Rinse q12n4p Mauricio Gerome Apley,  MD   15 mL at 06/10/16 1500  . mirtazapine (REMERON) tablet 15 mg  15 mg Oral QHS Tawni Millers, MD   15 mg at 06/09/16 2141  . multivitamin (PROSIGHT) tablet 1 tablet  1 tablet Oral BID Tawni Millers, MD   1 tablet at 06/10/16 0941  . omega-3 acid ethyl esters (LOVAZA) capsule 1 g  1 g Oral Daily Mauricio Gerome Apley, MD   1 g at 06/10/16 0941  . ondansetron (ZOFRAN) tablet 4 mg  4 mg Oral Q6H PRN Tawni Millers, MD       Or  . ondansetron [facility]) injection 4 mg  4 mg Intravenous Q6H PRN Tawni Millers, MD      . oxyCODONE (Oxy IR/ROXICODONE) immediate  release tablet 5-10 mg  5-10 mg Oral Q4H PRN Tawni Millers, MD   10 mg at 06/10/16 0113  . oxyCODONE (OXYCONTIN) 12 hr tablet 10 mg  10 mg Oral Q12H Mauricio Gerome Apley, MD   10 mg at 06/10/16 0941  . pantoprazole (PROTONIX) EC tablet 40 mg  40 mg Oral Daily Mauricio Gerome Apley, MD   40 mg at 06/10/16 0941  . polyethylene glycol (MIRALAX / GLYCOLAX) packet 17 g  17 g Oral Daily Tawni Millers, MD   17 g at 06/10/16 0941  . senna-docusate (Senokot-S) tablet 2 tablet  2 tablet Oral BID Tawni Millers, MD   2 tablet at 06/10/16 0941  . sodium chloride flush (NS) 0.9 % injection 3 mL  3 mL Intravenous Q12H Mauricio Gerome Apley, MD   3 mL at 06/08/16 1709    Physical Findings: Vitals - 1 value per visit 0/27/2536  SYSTOLIC 644  DIASTOLIC 66  Pulse 034  Temperature 98.1  Respirations 20  Weight (lb)   Height   BMI   VISIT REPORT      The patient is in no acute distress. Patient is alert and oriented. Lying comfortably on a hospital bed. Oxygen in place by nasal cannula. Examination of the lungs reveals some mild crackles in the left base and mildly decreased breath sounds in the left lung, right lung is clear. The heart has a regular rhythm and rate. Palpation over the thoracic spine area reveals no point tenderness.   Lab Findings: Lab Results  Component Value Date   WBC 9.6 06/10/2016   HGB 6.9 (LL) 06/10/2016   HCT 21.4 (L) 06/10/2016   MCV 92.2 06/10/2016   PLT 219 06/10/2016    Radiographic Findings: Dg Chest 1 View  Result Date: 06/09/2016 CLINICAL DATA:  Status post left thoracentesis. EXAM: CHEST 1 VIEW COMPARISON:  06/09/2016. Chest CT dated 05/27/2016. PET-CT dated 03/05/2016. FINDINGS: Normal sized heart. Mildly decreased left pleural fluid. Further increase in interstitial opacity in the left lung. No pneumothorax. The previously demonstrated right fourth posterior rib bone island is partially obscured by no and overlying lead. Patchy  sclerosis and lucency in the right humeral head with progressive sclerosis. IMPRESSION: 1. No pneumothorax following left thoracentesis. 2. Mildly decreased left pleural fluid. 3. Further progression of interstitial spread of lung cancer on the left. 4. Increased sclerosis associated with the previously demonstrated right humeral head metastasis. Electronically Signed   By: Claudie Revering M.D.   On: 06/09/2016 15:22   Dg Chest 2 View  Result Date: 06/08/2016 CLINICAL DATA:  Lung cancer in shortness of breath, worsening recently. EXAM: CHEST  2 VIEW COMPARISON:  Radiography 03/09/2016.  CT 05/27/2016. FINDINGS: Heart size is normal.  There is worsening of patchy attic spread of cancer throughout the left lung. Moderate size right effusion with atelectasis in the right lower lung. Question if there could be developing interstitial spread in the right perihilar region now. Bone island in the right fourth rib again noted. IMPRESSION: Worsened interstitial spread of cancer within the left lung. Left effusion with left base atelectasis. Question early development of perihilar interstitial spread on the right. Certainly, coexistent infectious pneumonia in the left lung is not excluded. Electronically Signed   By: Nelson Chimes M.D.   On: 06/08/2016 12:43   Ct Chest W Contrast  Result Date: 05/27/2016 CLINICAL DATA:  Left lung cancer staging with metastatic disease to bone adrenal glands. EXAM: CT CHEST, ABDOMEN, AND PELVIS WITH CONTRAST TECHNIQUE: Multidetector CT imaging of the chest, abdomen and pelvis was performed following the standard protocol during bolus administration of intravenous contrast. CONTRAST:  160m ISOVUE-300 IOPAMIDOL (ISOVUE-300) INJECTION 61% COMPARISON:  CT chest from 02/29/2016. Abdomen and pelvis CT from 11/08/2015. PET-CT from 03/05/2016. FINDINGS: CT CHEST FINDINGS Cardiovascular: Heart size is normal. Stable appearance trace anterior pericardial fluid. Coronary artery calcification is noted.  Atherosclerotic calcification is noted in the wall of the thoracic aorta. Mediastinum/Nodes: 8 mm short axis prevascular lymph node on image 28 series 2 is measured at 15 mm on the previous PET-CT. 12 mm short axis left hilar lymph node seen on image 30 was 18 mm when I remeasure it on the prior exam. 18 mm short axis subcarinal lymph node was 20 mm previously. No right hilar lymphadenopathy. Abnormal soft tissue attenuation in the left hilum is progressive in the interval. Lungs/Pleura: Persistent interlobular septal thickening and confluent airspace opacity is identified in the posterior left upper lobe, progressive in the interval. There is now a 5.7 x 6.9 cm masslike area of confluent airspace opacity, markedly progressive in the interval. This is in the region of hypermetabolism seen on the previous PET-CT. 11 mm posterior right upper lobe pulmonary nodule (image 77 series 7) is new in the interval. Scattered areas of peribronchovascular nodularity in the posterior right upper lobe suggest atypical infection. Musculoskeletal: Lytic lesions noted right humeral head. Compression deformity of an upper thoracic vertebral body is unchanged. 3.2 cm lytic lesion in the T10 vertebral body has progressed in the interval. CT ABDOMEN PELVIS FINDINGS Hepatobiliary: Scattered tiny hypodensities in the liver parenchyma were present previously are not substantially changed in the interval. These remain too small to characterize. Pancreas: No focal mass lesion. No dilatation of the main duct. No intraparenchymal cyst. No peripancreatic edema. Spleen: No splenomegaly. No focal mass lesion. Adrenals/Urinary Tract: Stable appearance 3.2 cm left adrenal lesion. Right adrenal gland remains normal in appearance. Cysts are identified in the kidneys bilaterally without appreciable interval change. No evidence for hydroureter. The urinary bladder appears normal for the degree of distention. Stomach/Bowel: Stomach is nondistended. No  gastric wall thickening. No evidence of outlet obstruction. Duodenum is normally positioned as is the ligament of Treitz. No small bowel wall thickening. No small bowel dilatation. The terminal ileum is normal. The appendix is normal. Diverticular changes are noted in the left colon without evidence of diverticulitis. Vascular/Lymphatic: There is no gastrohepatic or hepatoduodenal ligament lymphadenopathy. No intraperitoneal or retroperitoneal lymphadenopathy. There is abdominal aortic atherosclerosis without aneurysm. No pelvic sidewall lymphadenopathy. Reproductive: The prostate gland and seminal vesicles have normal imaging features. Other: No intraperitoneal free fluid. Musculoskeletal: No suspicious lytic or sclerotic bony abnormality in the abdomen and pelvis. IMPRESSION: 1. Marked interval progression  of confluent airspace opacity in the parahilar left lung, highly suspicious for disease progression. This region was hypermetabolic on the previous PET-CT and appears more confluent than typically seen for post radiation change. 2. Evidence of decreasing size mediastinal lymphadenopathy. 3. Interval development of a small posterior right upper lobe pulmonary nodule. Continued attention on follow-up recommended. 4. Interval progression of the lucent lesion in T10, seen to be hypermetabolic on previous PET-CT. Lesions in the right humeral head are incompletely visualized on today's study. 5. No change 3.2 cm left adrenal lesion, indeterminate by CT imaging. 6. Interval progression small left pleural effusion. 7.  Abdominal Aortic Atherosclerois (ICD10-170.0) 8.  Emphysema. (MWU13-K44.9) Electronically Signed   By: Misty Stanley M.D.   On: 05/27/2016 13:14   Ct Abdomen Pelvis W Contrast  Result Date: 05/27/2016 CLINICAL DATA:  Left lung cancer staging with metastatic disease to bone adrenal glands. EXAM: CT CHEST, ABDOMEN, AND PELVIS WITH CONTRAST TECHNIQUE: Multidetector CT imaging of the chest, abdomen and  pelvis was performed following the standard protocol during bolus administration of intravenous contrast. CONTRAST:  173m ISOVUE-300 IOPAMIDOL (ISOVUE-300) INJECTION 61% COMPARISON:  CT chest from 02/29/2016. Abdomen and pelvis CT from 11/08/2015. PET-CT from 03/05/2016. FINDINGS: CT CHEST FINDINGS Cardiovascular: Heart size is normal. Stable appearance trace anterior pericardial fluid. Coronary artery calcification is noted. Atherosclerotic calcification is noted in the wall of the thoracic aorta. Mediastinum/Nodes: 8 mm short axis prevascular lymph node on image 28 series 2 is measured at 15 mm on the previous PET-CT. 12 mm short axis left hilar lymph node seen on image 30 was 18 mm when I remeasure it on the prior exam. 18 mm short axis subcarinal lymph node was 20 mm previously. No right hilar lymphadenopathy. Abnormal soft tissue attenuation in the left hilum is progressive in the interval. Lungs/Pleura: Persistent interlobular septal thickening and confluent airspace opacity is identified in the posterior left upper lobe, progressive in the interval. There is now a 5.7 x 6.9 cm masslike area of confluent airspace opacity, markedly progressive in the interval. This is in the region of hypermetabolism seen on the previous PET-CT. 11 mm posterior right upper lobe pulmonary nodule (image 77 series 7) is new in the interval. Scattered areas of peribronchovascular nodularity in the posterior right upper lobe suggest atypical infection. Musculoskeletal: Lytic lesions noted right humeral head. Compression deformity of an upper thoracic vertebral body is unchanged. 3.2 cm lytic lesion in the T10 vertebral body has progressed in the interval. CT ABDOMEN PELVIS FINDINGS Hepatobiliary: Scattered tiny hypodensities in the liver parenchyma were present previously are not substantially changed in the interval. These remain too small to characterize. Pancreas: No focal mass lesion. No dilatation of the main duct. No  intraparenchymal cyst. No peripancreatic edema. Spleen: No splenomegaly. No focal mass lesion. Adrenals/Urinary Tract: Stable appearance 3.2 cm left adrenal lesion. Right adrenal gland remains normal in appearance. Cysts are identified in the kidneys bilaterally without appreciable interval change. No evidence for hydroureter. The urinary bladder appears normal for the degree of distention. Stomach/Bowel: Stomach is nondistended. No gastric wall thickening. No evidence of outlet obstruction. Duodenum is normally positioned as is the ligament of Treitz. No small bowel wall thickening. No small bowel dilatation. The terminal ileum is normal. The appendix is normal. Diverticular changes are noted in the left colon without evidence of diverticulitis. Vascular/Lymphatic: There is no gastrohepatic or hepatoduodenal ligament lymphadenopathy. No intraperitoneal or retroperitoneal lymphadenopathy. There is abdominal aortic atherosclerosis without aneurysm. No pelvic sidewall lymphadenopathy. Reproductive: The prostate  gland and seminal vesicles have normal imaging features. Other: No intraperitoneal free fluid. Musculoskeletal: No suspicious lytic or sclerotic bony abnormality in the abdomen and pelvis. IMPRESSION: 1. Marked interval progression of confluent airspace opacity in the parahilar left lung, highly suspicious for disease progression. This region was hypermetabolic on the previous PET-CT and appears more confluent than typically seen for post radiation change. 2. Evidence of decreasing size mediastinal lymphadenopathy. 3. Interval development of a small posterior right upper lobe pulmonary nodule. Continued attention on follow-up recommended. 4. Interval progression of the lucent lesion in T10, seen to be hypermetabolic on previous PET-CT. Lesions in the right humeral head are incompletely visualized on today's study. 5. No change 3.2 cm left adrenal lesion, indeterminate by CT imaging. 6. Interval progression  small left pleural effusion. 7.  Abdominal Aortic Atherosclerois (ICD10-170.0) 8.  Emphysema. (ASN05-L97.9) Electronically Signed   By: Misty Stanley M.D.   On: 05/27/2016 13:14   US Thoracentesis Asp Pleural Space W/img Guide  Result Date: 06/09/2016 INDICATION: Patient with a history of lung cancer with metastatic disease. Left pleural effusion noted on recent imaging. Request is made for diagnostic and therapeutic thoracentesis. EXAM: ULTRASOUND GUIDED DIAGNOSTIC AND THERAPEUTIC THORACENTESIS MEDICATIONS: 1% lidocaine COMPLICATIONS: None immediate. PROCEDURE: An ultrasound guided thoracentesis was thoroughly discussed with the patient and questions answered. The benefits, risks, alternatives and complications were also discussed. The patient understands and wishes to proceed with the procedure. Written consent was obtained. Ultrasound was performed to localize and mark an adequate pocket of fluid in the left chest. The area was then prepped and draped in the normal sterile fashion. 1% Lidocaine was used for local anesthesia. Under ultrasound guidance a Safe-T-Centesis catheter was introduced. Thoracentesis was performed. The catheter was removed and a dressing applied. FINDINGS: A total of approximately 2 L of bloody fluid was removed. Samples were sent to the laboratory as requested by the clinical team. IMPRESSION: Successful ultrasound guided left thoracentesis yielding 2 L of pleural fluid. Read by: Saverio Danker, PA-C Electronically Signed   By: Lucrezia Europe M.D.   On: 06/09/2016 15:41    Impression:Stage IV adenocarcinoma of the lung with osseous metastasis. Patient has had progression in the left perihilar mass despite his chemotherapy. He would be at risk for postobstructive pneumonia and potential lung collapse with further progression. Patient would be a good candidate for a short course of palliative radiation therapy directed at this area. Also has progression in the T10 spine although was not  significantly symptomatic from this area. I would recommend treatments to this region to avoid potential pathologic compression fracture or potentially cord involvement with progression. I discussed course of treatment side effects and potential toxicities of radiation therapy with the patient. He appears to understand wishes to proceed with planned course of treatment.     Plan:  Simulation and planning prior to discharge tomorrow if schedule will allow.Anticipate between 10 and 15 treatments.  ____________________________________ -----------------------------------  Blair Promise, PhD, MD  This document serves as a record of services personally performed by Gery Pray, MD. It was created on his behalf by Darcus Austin, a trained medical scribe. The creation of this record is based on the scribe's personal observations and the provider's statements to them. This document has been checked and approved by the attending provider.

## 2016-06-11 ENCOUNTER — Ambulatory Visit
Admit: 2016-06-11 | Discharge: 2016-06-11 | Disposition: A | Discharge status: Home / Self Care | Payer: Medicare Other | Source: Ambulatory Visit | Attending: Radiation Oncology | Admitting: Radiation Oncology

## 2016-06-11 ENCOUNTER — Telehealth: Payer: Self-pay | Admitting: Oncology

## 2016-06-11 DIAGNOSIS — C3492 Malignant neoplasm of unspecified part of left bronchus or lung: Secondary | ICD-10-CM | POA: Insufficient documentation

## 2016-06-11 DIAGNOSIS — J90 Pleural effusion, not elsewhere classified: Secondary | ICD-10-CM

## 2016-06-11 DIAGNOSIS — Z510 Encounter for antineoplastic radiation therapy: Secondary | ICD-10-CM | POA: Insufficient documentation

## 2016-06-11 DIAGNOSIS — C3490 Malignant neoplasm of unspecified part of unspecified bronchus or lung: Secondary | ICD-10-CM

## 2016-06-11 LAB — CBC
HEMATOCRIT: 26.8 % — AB (ref 39.0–52.0)
HEMOGLOBIN: 8.7 g/dL — AB (ref 13.0–17.0)
MCH: 29.2 pg (ref 26.0–34.0)
MCHC: 32.5 g/dL (ref 30.0–36.0)
MCV: 89.9 fL (ref 78.0–100.0)
Platelets: 234 10*3/uL (ref 150–400)
RBC: 2.98 MIL/uL — ABNORMAL LOW (ref 4.22–5.81)
RDW: 19.8 % — AB (ref 11.5–15.5)
WBC: 14.4 10*3/uL — ABNORMAL HIGH (ref 4.0–10.5)

## 2016-06-11 LAB — BASIC METABOLIC PANEL
Anion gap: 7 (ref 5–15)
BUN: 16 mg/dL (ref 6–20)
CALCIUM: 7.8 mg/dL — AB (ref 8.9–10.3)
CHLORIDE: 104 mmol/L (ref 101–111)
CO2: 22 mmol/L (ref 22–32)
CREATININE: 1.27 mg/dL — AB (ref 0.61–1.24)
GFR calc Af Amer: >60 mL/min (ref >60)
GFR calc non Af Amer: 52 mL/min — ABNORMAL LOW (ref >60)
GLUCOSE: 114 mg/dL — AB (ref 65–99)
Potassium: 5.8 mmol/L — ABNORMAL HIGH (ref 3.5–5.1)
Sodium: 133 mmol/L — ABNORMAL LOW (ref 135–145)

## 2016-06-11 LAB — TYPE AND SCREEN
BLOOD PRODUCT EXPIRATION DATE: 201801292359
Blood Product Expiration Date: 201801252359
ISSUE DATE / TIME: 201801231149
ISSUE DATE / TIME: 201801231720
UNIT TYPE AND RH: 600
Unit Type and Rh: 600

## 2016-06-11 LAB — HEMOGLOBIN AND HEMATOCRIT, BLOOD
HEMATOCRIT: 27.5 % — AB (ref 39.0–52.0)
Hemoglobin: 9.1 g/dL — ABNORMAL LOW (ref 13.0–17.0)

## 2016-06-11 LAB — POTASSIUM: Potassium: 5 mmol/L (ref 3.5–5.1)

## 2016-06-11 LAB — PH, BODY FLUID: pH, Body Fluid: 7

## 2016-06-11 MED ORDER — ENOXAPARIN SODIUM 80 MG/0.8ML ~~LOC~~ SOLN
1.0000 mg/kg | Freq: Two times a day (BID) | SUBCUTANEOUS | Status: DC
  Administered 2016-06-11 – 2016-06-13 (×4): 80 mg via SUBCUTANEOUS
  Filled 2016-06-11 (×4): qty 0.8

## 2016-06-11 NOTE — Telephone Encounter (Signed)
Cleon Dew, RN on [place] said it was OK for Mr. Venning to come for CT SIM today at 9 am.  She said he travels by wheelchair and is on 2L of oxygen.  Notified Aaron Edelman, RT in [place] SIM.

## 2016-06-11 NOTE — Progress Notes (Signed)
Patient returned from MRI and found by RN to be visibly upset, crying, and anxious over not being able to finish MRI test.  RN informed patient that he tried his best and that was all we could do. Reassured patient that it was ok and not to worry about not being able to complete exam. Patient called wife and daughter who then returned to hospital to check on him. Unsure if patient's reaction was due to the pre exam po ativan that was given or the test itself. Will continue to monitor patient.

## 2016-06-11 NOTE — Evaluation (Signed)
Physical Therapy Evaluation Patient Details Name: KAZMIR OKI MRN: [id number] DOB: 04-09-1935 Today's Date: 06/11/2016   History of Present Illness  Kimoni Pagliarulo Dacus is a 82 y.o. 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 Pt currently with functional limitations due to the deficits listed below (see PT Problem List). * Pt will benefit from skilled PT to increase their independence and safety with mobility to allow discharge to the venue listed below.    Per pt and wife he has been progressively less active, limited amb and needing at least some  assist with ADLS for approx 2 mos; recommend HHPT and HHOT at D/C; will follow in acute setting    Follow Up Recommendations Home health PT (and HHOT)    Equipment Recommendations  Rolling walker with 5" wheels    Recommendations for Other Services       Precautions / Restrictions Precautions Precautions: Fall Restrictions Weight Bearing Restrictions: No      Mobility  Bed Mobility Overal bed mobility: Needs Assistance Bed Mobility: Supine to Sit     Supine to sit: Supervision     General bed mobility comments: for safety  Transfers Overall transfer level: Needs assistance Equipment used: Rolling walker (2 wheeled) Transfers: Sit to/from Stand Sit to Stand: Min assist;Min guard         General transfer comment: cues for hand placement  Ambulation/Gait Ambulation/Gait assistance: Min guard;Min assist Ambulation Distance (Feet): 100 Feet Assistive device: Rolling walker (2 wheeled);None (15' without AD) Gait Pattern/deviations: Step-through pattern;Trunk flexed     General Gait Details: fatigues easily, no overt LOB but mildly unsteady without support   Stairs            Wheelchair Mobility    Modified Rankin (Stroke Patients  Only)       Balance                                             Pertinent Vitals/Pain Pain Assessment: 0-10 Pain Score: 0-No pain Pain Location: chest pain with coughing Pain Intervention(s): Monitored during session    Home Living Family/patient expects to be discharged to:: Private residence Living Arrangements: Spouse/significant other Available Help at Discharge: Family;Available 24 hours/day Type of Home: House Home Access: Stairs to enter Entrance Stairs-Rails: Right Entrance Stairs-Number of Steps: 3-4 Home Layout: One level Home Equipment: None      Prior Function Level of Independence: Needs assistance   Gait / Transfers Assistance Needed: amb very little for the past 2 mos per pt and wife; mostly he ambulates  only to and from bathroom   ADL's / Homemaking Assistance Needed: assist with shower (specifically drying off per pt and wife) d/t fatigue; pt assists with LB dressing        Hand Dominance        Extremity/Trunk Assessment   Upper Extremity Assessment Upper Extremity Assessment: RUE deficits/detail RUE Deficits / Details: decr AROM right shoulder--boney mets to humerus, NT further    Lower Extremity Assessment Lower Extremity Assessment: Overall WFL for tasks assessed (although fatigues rapidly)       Communication   Communication: No difficulties  Cognition Arousal/Alertness: Awake/alert Behavior During Therapy: WFL for tasks assessed/performed Overall Cognitive Status: Within  Functional Limits for tasks assessed                      General Comments      Exercises     Assessment/Plan    PT Assessment Patient needs continued PT services  PT Problem List Decreased activity tolerance;Decreased balance;Decreased mobility          PT Treatment Interventions DME instruction;Gait training;Functional mobility training;Therapeutic activities;Patient/family education;Therapeutic exercise    PT Goals (Current  goals can be found in the Care Plan section)  Acute Rehab PT Goals Patient Stated Goal: be stronger, able to do more PT Goal Formulation: With patient Time For Goal Achievement: 06/18/16 Potential to Achieve Goals: Good    Frequency Min 3X/week   Barriers to discharge        Co-evaluation               End of Session Equipment Utilized During Treatment: Gait belt Activity Tolerance: Patient tolerated treatment well Patient left: in chair;with family/visitor present (NT informed no chair alarm, wife present until 5pm)           Time: 1141-1155 PT Time Calculation (min) (ACUTE ONLY): 14 min   Charges:   PT Evaluation $PT Eval Low Complexity: 1 Procedure     PT G Codes:        Thalya Fouche 02-Jul-2016, 12:40 PM

## 2016-06-11 NOTE — Care Management Note (Signed)
Case Management Note  Patient Details  Name: Tim Ross MRN: [id number] Date of Birth: 08-Dec-1934  Subjective/Objective:  PT-recc HHPT,HHOT,home rw. Patient offered choice for [place] agency-chose [facility](used in the past) [facility] rep Kim aware of HHPT/HHOT, & home rw-await HHC/home dme  orders. Noted on 02-will monitor.                  Action/Plan:d/c home w/HHC/DME   Expected Discharge Date:  06/11/16               Expected Discharge Plan:  [place]  In-House Referral:     Discharge planning Services  CM Consult  Post Acute Care Choice:    Choice offered to:  Patient  DME Arranged:    DME Agency:     HH Arranged:    [place] Agency:  [facility]  Status of Service:  In process, will continue to follow  If discussed at Long Length of Stay Meetings, dates discussed:    Additional Comments:  Dessa Phi, RN 06/11/2016, 2:16 PM

## 2016-06-11 NOTE — Progress Notes (Deleted)
Pharmacy Antibiotic Note  Tim Ross is a 82 y.o. male admitted on 06/08/2016 with pneumonia.  Pharmacy has been consulted for Levaquin dosing on 1/21  Plan: Day 3 Abxs Continue Levaquin '750mg'$  IV daily   Height: '6\' 1"'$  (185.4 cm) Weight: 181 lb (82.1 kg) IBW/kg (Calculated) : 79.9  Temp (24hrs), Avg:98.3 F (36.8 C), Min:97.8 F (36.6 C), Max:99 F (37.2 C)   Recent Labs Lab 06/08/16 1220 06/09/16 0519 06/10/16 0528 06/11/16 0426  WBC 15.9* 11.9* 9.6 14.4*  CREATININE 1.33* 1.23 1.35* 1.27*    Estimated Creatinine Clearance: 53.3 mL/min (by C-G formula based on SCr of 1.27 mg/dL (H)).    Allergies  Allergen Reactions  . Celebrex [Celecoxib] Hives     Thank you for allowing pharmacy to be a part of this patient's care.  Adrian Saran, PharmD, BCPS Pager [phone] 06/11/2016 10:11 AM

## 2016-06-11 NOTE — Progress Notes (Signed)
PROGRESS NOTE    Tim Ross   OEU:[id number]  DOB: Nov 27, 1934  DOA: 06/08/2016 PCP: Garret Reddish, MD   Brief Narrative:  Tim Means Redmonis a 82 y.o.malewith medical history significant of metastatic lung cancer and PE who presents to the hospital with worsening dyspnea. For last 4 days patient has been developing worsening dyspnea to the point where he is dyspneic with minimal efforts. His dyspnea is moderate to severe intensity, worse with exertion it has been associated with cough which has been productive with clear phlegm, wheezing, no fevers or chills. Positive generalized malaise and decreased energy. There seems to be no improvement factors to his dyspnea..  Subjective:    Assessment & Plan: Acute respiratory failure.  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 Will continue, lorazepam andOxyContin/oxycodone.  - Dr Irene Limbo and Sondra Come following - will have L perihilar and T10 met radiation  Depression - Continue mirtazapine  Anemia - Hb down from  8.3- 6.9 with hydration, transfused 2 units PRBC on 1/123  Hyperkalemia - AM lab showed K of 5.8- have repeated it- now 5.0 - follow   DVT prophylaxis: Lovenox Code Status: Full code Family Communication: wife Disposition Plan: home Consultants:   [facility] onc and rad on Procedures:    Antimicrobials:  Anti-infectives    Start     Dose/Rate Route Frequency Ordered Stop   06/09/16 1500  levofloxacin (LEVAQUIN) IVPB 750 mg  Status:  Discontinued     750 mg 100 mL/hr over 90 Minutes Intravenous Every 24 hours 06/08/16 1751 06/11/16  1000   06/08/16 1430  cefTRIAXone (ROCEPHIN) 1 g in dextrose 5 % 50 mL IVPB     1 g 100 mL/hr over 30 Minutes Intravenous  Once 06/08/16 1423 06/08/16 1516   06/08/16 1430  azithromycin (ZITHROMAX) 500 mg in dextrose 5 % 250 mL IVPB  Status:  Discontinued     500 mg 250 mL/hr over 60 Minutes Intravenous Every 24 hours 06/08/16 1423 06/08/16 1719       Objective: Vitals:   06/10/16 1927 06/10/16 1945 06/10/16 2105 06/11/16 0459  BP:  127/71 (!) 151/80 119/69  Pulse:  (!) 118 (!) 137 (!) 115  Resp:   (!) 22 20  Temp:  99 F (37.2 C) 97.8 F (36.6 C) 98.1 F (36.7 C)  TempSrc:  Oral Oral Oral  SpO2: 95% 94% 95% 95%  Weight:      Height:        Intake/Output Summary (Last 24 hours) at 06/11/16 1222 Last data filed at 06/11/16 0331  Gross per 24 hour  Intake           830.83 ml  Output              200 ml  Net           630.83 ml   Filed Weights   06/08/16 1143  Weight: 82.1 kg (181 lb)    Examination: General exam: Appears comfortable  HEENT: PERRLA, oral mucosa moist, no sclera icterus or thrush Respiratory system: mild crackes in LLL-  Respiratory effort normal. Cardiovascular system: S1 & S2 heard, RRR.  No  murmurs  Gastrointestinal system: Abdomen soft, non-tender, nondistended. Normal bowel sound. No organomegaly Central nervous system: Alert and oriented. No focal neurological deficits. Extremities: No cyanosis, clubbing or edema Skin: No rashes or ulcers Psychiatry:  Mood & affect appropriate.     Data Reviewed: I have personally reviewed following labs and imaging studies  CBC:  Recent Labs Lab 06/08/16 1220 06/09/16 0519 06/10/16 0528 06/10/16 2355 06/11/16 0426  WBC 15.9* 11.9* 9.6  --  14.4*  NEUTROABS 11.3*  --   --   --   --   HGB 8.3* 7.3* 6.9* 9.1* 8.7*  HCT 26.4* 22.8* 21.4* 27.5* 26.8*  MCV 93.3 94.2 92.2  --  89.9  PLT 262 222 219  --  035   Basic Metabolic Panel:  Recent Labs Lab 06/08/16 1220 06/09/16 0519 06/10/16 0528  06/11/16 0426 06/11/16 0844  NA 132* 135 136 133*  --   K 4.8 4.8 4.8 5.8* 5.0  CL 102 106 106 104  --   CO2 24 22 24 22   --   GLUCOSE 113* 127* 122* 114*  --   BUN 19 19 17 16   --   CREATININE 1.33* 1.23 1.35* 1.27*  --   CALCIUM 7.4* 7.4* 7.6* 7.8*  --    GFR: Estimated Creatinine Clearance: 53.3 mL/min (by C-G formula based on SCr of 1.27 mg/dL (H)). Liver Function Tests:  Recent Labs Lab 06/08/16 1220 06/09/16 0519  AST 36 36  ALT 37 34  ALKPHOS 73 63  BILITOT 0.6 0.3  PROT 6.6 5.8*  ALBUMIN 2.5* 2.3*   No results for input(s): LIPASE, AMYLASE in the last 168 hours. No results for input(s): AMMONIA in the last 168 hours. Coagulation Profile: No results for input(s): INR, PROTIME in the last 168 hours. Cardiac Enzymes:  Recent Labs Lab 06/08/16 1220  TROPONINI <0.03   BNP (last 3 results)  Recent Labs  02/27/16 1031  PROBNP 37.0   HbA1C: No results for input(s): HGBA1C in the last 72 hours. CBG: No results for input(s): GLUCAP in the last 168 hours. Lipid Profile: No results for input(s): CHOL, HDL, LDLCALC, TRIG, CHOLHDL, LDLDIRECT in the last 72 hours. Thyroid Function Tests: No results for input(s): TSH, T4TOTAL, FREET4, T3FREE, THYROIDAB in the last 72 hours. Anemia Panel: No results for input(s): VITAMINB12, FOLATE, FERRITIN, TIBC, IRON, RETICCTPCT in the last 72 hours. Urine analysis:    Component Value Date/Time   COLORURINE YELLOW 03/09/2016 [place] 03/09/2016 1754   LABSPEC 1.023 03/09/2016 1754   PHURINE 5.5 03/09/2016 1754   GLUCOSEU NEGATIVE 03/09/2016 1754   HGBUR NEGATIVE 03/09/2016 1754   BILIRUBINUR NEGATIVE 03/09/2016 1754   BILIRUBINUR n 06/07/2014 0929   KETONESUR NEGATIVE 03/09/2016 1754   PROTEINUR NEGATIVE 03/09/2016 1754   UROBILINOGEN 0.2 06/07/2014 0929   NITRITE NEGATIVE 03/09/2016 1754   LEUKOCYTESUR NEGATIVE 03/09/2016 1754   Sepsis Labs: @LABRCNTIP (procalcitonin:4,lacticidven:4) ) Recent  Results (from the past 240 hour(s))  Culture, body fluid-bottle     Status: None (Preliminary result)   Collection Time: 06/09/16  3:24 PM  Result Value Ref Range Status   Specimen Description FLUID LEFT PLEURAL  Final   Special Requests BOTTLES DRAWN AEROBIC AND ANAEROBIC 10CC  Final   Culture   Final    NO GROWTH < 24 HOURS Performed at [facility], [place] [address], [place], [place] [address]    Report Status PENDING  Incomplete  Gram stain     Status: None  Collection Time: 06/09/16  3:24 PM  Result Value Ref Range Status   Specimen Description FLUID LEFT PLEURAL  Final   Special Requests NONE  Final   Gram Stain   Final    ABUNDANT WBC PRESENT,BOTH PMN AND MONONUCLEAR NO ORGANISMS SEEN Performed at [facility], [address], [place], [place] [address]    Report Status 06/09/2016 FINAL  Final         Radiology Studies: Dg Chest 1 View  Result Date: 06/09/2016 CLINICAL DATA:  Status post left thoracentesis. EXAM: CHEST 1 VIEW COMPARISON:  06/09/2016. Chest CT dated 05/27/2016. PET-CT dated 03/05/2016. FINDINGS: Normal sized heart. Mildly decreased left pleural fluid. Further increase in interstitial opacity in the left lung. No pneumothorax. The previously demonstrated right fourth posterior rib bone island is partially obscured by no and overlying lead. Patchy sclerosis and lucency in the right humeral head with progressive sclerosis. IMPRESSION: 1. No pneumothorax following left thoracentesis. 2. Mildly decreased left pleural fluid. 3. Further progression of interstitial spread of lung cancer on the left. 4. Increased sclerosis associated with the previously demonstrated right humeral head metastasis. Electronically Signed   By: Claudie Revering M.D.   On: 06/09/2016 15:22   Mr Brain Wo Contrast  Result Date: 06/10/2016 CLINICAL DATA:  Initial evaluation for history of metastatic lung cancer and P with worsening dyspnea. EXAM: MRI HEAD WITHOUT CONTRAST TECHNIQUE:  Multiplanar, multiecho pulse sequences of the brain and surrounding structures were obtained without intravenous contrast. COMPARISON:  Previous MRI from 03/10/2016. FINDINGS: Brain: Study is technically limited as the patient was unable to tolerate the full length of the exam. Only diffusion-weighted imaging, sagittal T1, with axial T2 and FLAIR sequences were performed. Additionally, images provided are somewhat degraded by motion artifact. Generalized age-related cerebral atrophy is stable from previous. Mild for age chronic microvascular ischemic changes also similar. No abnormal foci of restricted diffusion to suggest acute or subacute ischemia. Gray-white matter differentiation maintained. No evidence for chronic infarction. No mass lesion, midline shift, or mass effect. Ventricles normal in size without evidence for hydrocephalus. Small right frontal hygroma is stable from previous. No imaging findings to suggest metastatic disease identified on this limited noncontrast examination. Pituitary gland and suprasellar region within normal limits. Vascular: Major intracranial vascular flow voids are maintained. Skull and upper cervical spine: Craniocervical junction normal. Degenerative thickening noted at the tectorial membrane without significant stenosis. Visualized upper cervical spine otherwise unremarkable. Bone marrow signal intensity within normal limits. No scalp soft tissue abnormality. Sinuses/Orbits: Globes and orbital soft tissues within normal limits. Patient is status post cataract extraction bilaterally. Mild scattered mucosal thickening within the ethmoidal air cells. Visualized paranasal sinuses are otherwise clear. No mastoid effusion. Inner ear structures grossly normal. IMPRESSION: 1. Technically limited study due to patient's inability to tolerate the full length of the exam and motion artifact. 2. No acute intracranial process identified. No evidence for intracranial metastatic disease on  this limited noncontrast examination. 3. Stable small right frontal hygroma. Electronically Signed   By: Jeannine Boga M.D.   On: 06/10/2016 22:13   US Thoracentesis Asp Pleural Space W/img Guide  Result Date: 06/09/2016 INDICATION: Patient with a history of lung cancer with metastatic disease. Left pleural effusion noted on recent imaging. Request is made for diagnostic and therapeutic thoracentesis. EXAM: ULTRASOUND GUIDED DIAGNOSTIC AND THERAPEUTIC THORACENTESIS MEDICATIONS: 1% lidocaine COMPLICATIONS: None immediate. PROCEDURE: An ultrasound guided thoracentesis was thoroughly discussed with the patient and questions answered. The benefits, risks, alternatives and complications were  also discussed. The patient understands and wishes to proceed with the procedure. Written consent was obtained. Ultrasound was performed to localize and mark an adequate pocket of fluid in the left chest. The area was then prepped and draped in the normal sterile fashion. 1% Lidocaine was used for local anesthesia. Under ultrasound guidance a Safe-T-Centesis catheter was introduced. Thoracentesis was performed. The catheter was removed and a dressing applied. FINDINGS: A total of approximately 2 L of bloody fluid was removed. Samples were sent to the laboratory as requested by the clinical team. IMPRESSION: Successful ultrasound guided left thoracentesis yielding 2 L of pleural fluid. Read by: Saverio Danker, PA-C Electronically Signed   By: Lucrezia Europe M.D.   On: 06/09/2016 15:41      Scheduled Meds: . chlorhexidine  15 mL Mouth Rinse BID  . enoxaparin (LOVENOX) injection  40 mg Subcutaneous Q24H  . feeding supplement (ENSURE ENLIVE)  237 mL Oral BID BM  . folic acid  1 mg Oral Daily  . ipratropium-albuterol  3 mL Nebulization TID  . mouth rinse  15 mL Mouth Rinse q12n4p  . mirtazapine  15 mg Oral QHS  . multivitamin  1 tablet Oral BID  . omega-3 acid ethyl esters  1 g Oral Daily  . oxyCODONE  10 mg Oral Q12H   . pantoprazole  40 mg Oral Daily  . polyethylene glycol  17 g Oral Daily  . senna-docusate  2 tablet Oral BID  . sodium chloride flush  3 mL Intravenous Q12H   Continuous Infusions:   LOS: 3 days    Time spent in minutes: Cane Beds, MD [facility] Pager: [email or web address] Password [facility] 06/11/2016, 12:22 PM

## 2016-06-11 NOTE — Progress Notes (Signed)
SATURATION QUALIFICATIONS: (This note is used to comply with regulatory documentation for home oxygen)  Patient Saturations on Room Air at Rest = 92%  Patient Saturations on Room Air while Ambulating = 91%  Patient Saturations on 2 Liters of oxygen while Ambulating = 98%

## 2016-06-12 ENCOUNTER — Inpatient Hospital Stay (HOSPITAL_COMMUNITY): Payer: Medicare Other

## 2016-06-12 LAB — PROCALCITONIN: Procalcitonin: 1.22 ng/mL

## 2016-06-12 LAB — BASIC METABOLIC PANEL
ANION GAP: 7 (ref 5–15)
ANION GAP: 9 (ref 5–15)
BUN: 18 mg/dL (ref 6–20)
BUN: 20 mg/dL (ref 6–20)
CO2: 23 mmol/L (ref 22–32)
CO2: 25 mmol/L (ref 22–32)
Calcium: 8.1 mg/dL — ABNORMAL LOW (ref 8.9–10.3)
Calcium: 8.2 mg/dL — ABNORMAL LOW (ref 8.9–10.3)
Chloride: 103 mmol/L (ref 101–111)
Chloride: 100 mmol/L — ABNORMAL LOW (ref 101–111)
Creatinine, Ser: 1.31 mg/dL — ABNORMAL HIGH (ref 0.61–1.24)
Creatinine, Ser: 1.56 mg/dL — ABNORMAL HIGH (ref 0.61–1.24)
GFR calc Af Amer: 58 mL/min — ABNORMAL LOW (ref >60)
GFR calc Af Amer: 47 mL/min — ABNORMAL LOW (ref >60)
GFR, EST NON AFRICAN AMERICAN: 50 mL/min — AB (ref >60)
GFR, EST NON AFRICAN AMERICAN: 41 mL/min — AB (ref >60)
GLUCOSE: 127 mg/dL — AB (ref 65–99)
GLUCOSE: 117 mg/dL — AB (ref 65–99)
POTASSIUM: 5.3 mmol/L — AB (ref 3.5–5.1)
POTASSIUM: 4.6 mmol/L (ref 3.5–5.1)
Sodium: 133 mmol/L — ABNORMAL LOW (ref 135–145)
Sodium: 134 mmol/L — ABNORMAL LOW (ref 135–145)

## 2016-06-12 LAB — CBC
HEMATOCRIT: 28.4 % — AB (ref 39.0–52.0)
HEMATOCRIT: 26.1 % — AB (ref 39.0–52.0)
HEMOGLOBIN: 9.3 g/dL — AB (ref 13.0–17.0)
HEMOGLOBIN: 8.6 g/dL — AB (ref 13.0–17.0)
MCH: 30.5 pg (ref 26.0–34.0)
MCH: 30.7 pg (ref 26.0–34.0)
MCHC: 32.7 g/dL (ref 30.0–36.0)
MCHC: 33 g/dL (ref 30.0–36.0)
MCV: 93.1 fL (ref 78.0–100.0)
MCV: 93.2 fL (ref 78.0–100.0)
PLATELETS: 257 10*3/uL (ref 150–400)
Platelets: 285 10*3/uL (ref 150–400)
RBC: 3.05 MIL/uL — AB (ref 4.22–5.81)
RBC: 2.8 MIL/uL — ABNORMAL LOW (ref 4.22–5.81)
RDW: 19.8 % — ABNORMAL HIGH (ref 11.5–15.5)
RDW: 19.6 % — ABNORMAL HIGH (ref 11.5–15.5)
WBC: 17.8 10*3/uL — AB (ref 4.0–10.5)
WBC: 19.2 10*3/uL — ABNORMAL HIGH (ref 4.0–10.5)

## 2016-06-12 MED ORDER — LEVOFLOXACIN 750 MG PO TABS
750.0000 mg | ORAL_TABLET | Freq: Every day | ORAL | Status: DC
  Administered 2016-06-12 – 2016-06-13 (×2): 750 mg via ORAL
  Filled 2016-06-12 (×2): qty 1

## 2016-06-12 MED ORDER — SODIUM POLYSTYRENE SULFONATE 15 GM/60ML PO SUSP
30.0000 g | Freq: Once | ORAL | Status: AC
  Administered 2016-06-12: 30 g via ORAL
  Filled 2016-06-12: qty 120

## 2016-06-12 MED ORDER — BENZONATATE 100 MG PO CAPS
200.0000 mg | ORAL_CAPSULE | Freq: Three times a day (TID) | ORAL | Status: DC | PRN
  Administered 2016-06-12: 200 mg via ORAL
  Filled 2016-06-12: qty 2

## 2016-06-12 NOTE — Progress Notes (Signed)
SATURATION QUALIFICATIONS: (This note is used to comply with regulatory documentation for home oxygen)  Patient Saturations on Room Air at Rest = 93%  Patient Saturations on Room Air while Ambulating = 83%  Patient Saturations on 2 Liters of oxygen while Ambulating = 94%  Please briefly explain why patient needs home oxygen: Pt oxygen saturation dropped while walking

## 2016-06-12 NOTE — Progress Notes (Signed)
PROGRESS NOTE    Tim Ross   UJW:[id number]  DOB: 08-Feb-1935  DOA: 06/08/2016 PCP: Garret Reddish, MD   Brief Narrative:  Tim Ross a 82 y.o.malewith medical history significant of metastatic lung cancer and PE who presents to the hospital with worsening dyspnea. For last 4 days patient has been developing worsening dyspnea to the point where he is dyspneic with minimal efforts. His dyspnea is moderate to severe intensity, worse with exertion it has been associated with cough which has been productive with clear phlegm, wheezing, no fevers or chills. Positive generalized malaise and decreased energy. There seems to be no improvement factors to his dyspnea..  Subjective: No dyspnea or chest pain. Mild cough with white sputum.    Assessment & Plan: Acute respiratory failure.  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 Will continue, lorazepam andOxyContin/oxycodone.  - Dr Irene Limbo and Sondra Come following - will have L perihilar and T10 met radiation  Depression - Continue mirtazapine  Anemia - Hb down from  8.3- 6.9 with hydration, transfused 2 units PRBC on 1/123  Hyperkalemia - AM lab showed K of 5.8- have repeated it- 5.0 >> 5.3- given kayexalate today- will follow   DVT prophylaxis: Lovenox Code Status: Full code Family Communication: wife Disposition Plan: home Consultants:   [facility] onc and rad on Procedures:    Antimicrobials:    Anti-infectives    Start     Dose/Rate Route Frequency Ordered Stop   06/12/16 1000  levofloxacin (LEVAQUIN) tablet 750 mg     750 mg Oral Daily 06/12/16 0723     06/09/16 1500  levofloxacin (LEVAQUIN) IVPB 750 mg  Status:  Discontinued     750 mg 100 mL/hr over 90 Minutes Intravenous Every 24 hours 06/08/16 1751 06/11/16 1000   06/08/16 1430  cefTRIAXone (ROCEPHIN) 1 g in dextrose 5 % 50 mL IVPB     1 g 100 mL/hr over 30 Minutes Intravenous  Once 06/08/16 1423 06/08/16 1516   06/08/16 1430  azithromycin (ZITHROMAX) 500 mg in dextrose 5 % 250 mL IVPB  Status:  Discontinued     500 mg 250 mL/hr over 60 Minutes Intravenous Every 24 hours 06/08/16 1423 06/08/16 1719       Objective: Vitals:   06/11/16 2234 06/12/16 0528 06/12/16 0957 06/12/16 1224  BP: 115/62 (!) 144/68  127/62  Pulse: (!) 109 (!) 123  (!) 113  Resp: _0 Temp: 99.1 F (37.3 C) 99 F (37.2 C)  97.6 F (36.4 C)  TempSrc: Oral Oral  Oral  SpO2: 92% 93% 91% 92%  Weight:      Height:        Intake/Output Summary (Last 24 hours) at 06/12/16 1255 Last data filed at 06/12/16 1100  Gross per 24 hour  Intake              480 ml  Output                0  ml  Net              480 ml   Filed Weights   06/08/16 1143  Weight: 82.1 kg (181 lb)    Examination: General exam: Appears comfortable  HEENT: PERRLA, oral mucosa moist, no sclera icterus or thrush Respiratory system: decreased breath sound LLL-  Respiratory effort normal. Cardiovascular system: S1 & S2 heard, RRR.  No murmurs  Gastrointestinal system: Abdomen soft, non-tender, nondistended. Normal bowel sound. No organomegaly Central nervous system: Alert and oriented. No focal neurological deficits. Extremities: No cyanosis, clubbing or edema Skin: No rashes or ulcers Psychiatry:  Mood & affect appropriate.     Data Reviewed: I have personally reviewed following labs and imaging studies  CBC:  Recent Labs Lab 06/08/16 1220 06/09/16 0519  06/10/16 0528 06/10/16 2355 06/11/16 0426 06/12/16 0539  WBC 15.9* 11.9* 9.6  --  14.4* 17.8*  NEUTROABS 11.3*  --   --   --   --   --   HGB 8.3* 7.3* 6.9* 9.1* 8.7* 9.3*  HCT 26.4* 22.8* 21.4* 27.5* 26.8* 28.4*  MCV 93.3 94.2 92.2  --  89.9 93.1  PLT 262 222 219  --  234 373   Basic Metabolic Panel:  Recent Labs Lab 06/08/16 1220 06/09/16 0519 06/10/16 0528 06/11/16 0426 06/11/16 0844 06/12/16 0539  NA 132* 135 136 133*  --  133*  K 4.8 4.8 4.8 5.8* 5.0 5.3*  CL 102 106 106 104  --  103  CO2 _0 --  23  GLUCOSE 113* 127* 122* 114*  --  127*  BUN _1 --  18  CREATININE 1.33* 1.23 1.35* 1.27*  --  1.31*  CALCIUM 7.4* 7.4* 7.6* 7.8*  --  8.1*   GFR: Estimated Creatinine Clearance: 51.7 mL/min (by C-G formula based on SCr of 1.31 mg/dL (H)). Liver Function Tests:  Recent Labs Lab 06/08/16 1220 06/09/16 0519  AST 36 36  ALT 37 34  ALKPHOS 73 63  BILITOT 0.6 0.3  PROT 6.6 5.8*  ALBUMIN 2.5* 2.3*   No results for input(s): LIPASE, AMYLASE in the last 168 hours. No results for input(s): AMMONIA in the last 168 hours. Coagulation Profile: No results for input(s): INR, PROTIME in the last 168 hours. Cardiac Enzymes:  Recent Labs Lab 06/08/16 1220  TROPONINI <0.03   BNP (last 3 results)  Recent Labs  02/27/16 1031  PROBNP 37.0   HbA1C: No results for input(s): HGBA1C in the last 72 hours. CBG: No results for input(s): GLUCAP in the last 168 hours. Lipid Profile: No results for input(s): CHOL, HDL, LDLCALC, TRIG, CHOLHDL, LDLDIRECT in the last 72 hours. Thyroid Function Tests: No results for input(s): TSH, T4TOTAL, FREET4, T3FREE, THYROIDAB in the last 72 hours. Anemia Panel: No results for input(s): VITAMINB12, FOLATE, FERRITIN, TIBC, IRON, RETICCTPCT in the last 72 hours. Urine analysis:    Component Value Date/Time   COLORURINE YELLOW 03/09/2016 [place] 03/09/2016 1754   LABSPEC 1.023 03/09/2016 1754   PHURINE  5.5 03/09/2016 1754   GLUCOSEU NEGATIVE 03/09/2016 1754   HGBUR NEGATIVE 03/09/2016 1754   BILIRUBINUR NEGATIVE 03/09/2016 1754   BILIRUBINUR n 06/07/2014 0929   KETONESUR NEGATIVE 03/09/2016 1754   PROTEINUR NEGATIVE 03/09/2016 1754   UROBILINOGEN 0.2 06/07/2014 0929   NITRITE NEGATIVE 03/09/2016 1754   LEUKOCYTESUR NEGATIVE 03/09/2016 1754   Sepsis Labs: _2 (procalcitonin:4,lacticidven:4) ) Recent Results (from the past 240 hour(s))  Culture, body  fluid-bottle     Status: None (Preliminary result)   Collection Time: 06/09/16  3:24 PM  Result Value Ref Range Status   Specimen Description FLUID LEFT PLEURAL  Final   Special Requests BOTTLES DRAWN AEROBIC AND ANAEROBIC 10CC  Final   Culture   Final    NO GROWTH 2 DAYS Performed at [facility], [place] [address] [place], [place] [address]    Report Status PENDING  Incomplete  Gram stain     Status: None   Collection Time: 06/09/16  3:24 PM  Result Value Ref Range Status   Specimen Description FLUID LEFT PLEURAL  Final   Special Requests NONE  Final   Gram Stain   Final    ABUNDANT WBC PRESENT,BOTH PMN AND MONONUCLEAR NO ORGANISMS SEEN Performed at [facility], [address] [place], [place] [address]    Report Status 06/09/2016 FINAL  Final         Radiology Studies: Dg Chest 2 View  Result Date: 06/12/2016 CLINICAL DATA:  Shortness of breath, cough, right chest pain, lung cancer EXAM: CHEST  2 VIEW COMPARISON:  06/07/2016 FINDINGS: Similar diffuse left lung mixed airspace and interstitial opacities compatible with known lung cancer and diffuse lymphangitic or interstitial spread. Similar small left pleural effusion. Difficult to exclude superimposed pneumonia. Right lung remains hyper aerated and clear. Trachea is midline. Normal heart size and vascularity. Aorta is atherosclerotic. Bones are osteopenic and degenerative changes noted of the spine. Stable sclerotic changes in the right humerus  proximally. IMPRESSION: Stable diffuse left lung mixed airspace and interstitial opacities compatible with known left lung cancer and diffuse lymphangitic tumor spread. Difficult to exclude superimposed pneumonia. Stable small left effusion No significant interval change since 06/09/2016 Electronically Signed   By: Jerilynn Mages.  Shick M.D.   On: 06/12/2016 09:10   Mr Brain Wo Contrast  Result Date: 06/10/2016 CLINICAL DATA:  Initial evaluation for history of metastatic lung cancer and P with worsening dyspnea. EXAM: MRI HEAD WITHOUT CONTRAST TECHNIQUE: Multiplanar, multiecho pulse sequences of the brain and surrounding structures were obtained without intravenous contrast. COMPARISON:  Previous MRI from 03/10/2016. FINDINGS: Brain: Study is technically limited as the patient was unable to tolerate the full length of the exam. Only diffusion-weighted imaging, sagittal T1, with axial T2 and FLAIR sequences were performed. Additionally, images provided are somewhat degraded by motion artifact. Generalized age-related cerebral atrophy is stable from previous. Mild for age chronic microvascular ischemic changes also similar. No abnormal foci of restricted diffusion to suggest acute or subacute ischemia. Gray-white matter differentiation maintained. No evidence for chronic infarction. No mass lesion, midline shift, or mass effect. Ventricles normal in size without evidence for hydrocephalus. Small right frontal hygroma is stable from previous. No imaging findings to suggest metastatic disease identified on this limited noncontrast examination. Pituitary gland and suprasellar region within normal limits. Vascular: Major intracranial vascular flow voids are maintained. Skull and upper cervical spine: Craniocervical junction normal. Degenerative thickening noted at the tectorial membrane without significant stenosis. Visualized upper cervical spine otherwise unremarkable. Bone marrow signal intensity within normal limits. No scalp  soft tissue abnormality. Sinuses/Orbits: Globes and orbital soft tissues within normal limits. Patient is status post cataract extraction bilaterally. Mild scattered mucosal thickening within the ethmoidal air cells. Visualized paranasal sinuses are otherwise clear. No mastoid effusion. Inner ear structures grossly normal. IMPRESSION: 1. Technically limited study due to patient's inability to tolerate the full length of the exam and motion artifact. 2. No acute intracranial process  identified. No evidence for intracranial metastatic disease on this limited noncontrast examination. 3. Stable small right frontal hygroma. Electronically Signed   By: Jeannine Boga M.D.   On: 06/10/2016 22:13      Scheduled Meds: . chlorhexidine  15 mL Mouth Rinse BID  . enoxaparin (LOVENOX) injection  1 mg/kg Subcutaneous Q12H  . feeding supplement (ENSURE ENLIVE)  237 mL Oral BID BM  . folic acid  1 mg Oral Daily  . ipratropium-albuterol  3 mL Nebulization TID  . levofloxacin  750 mg Oral Daily  . mouth rinse  15 mL Mouth Rinse q12n4p  . mirtazapine  15 mg Oral QHS  . multivitamin  1 tablet Oral BID  . omega-3 acid ethyl esters  1 g Oral Daily  . oxyCODONE  10 mg Oral Q12H  . pantoprazole  40 mg Oral Daily  . polyethylene glycol  17 g Oral Daily  . senna-docusate  2 tablet Oral BID  . sodium chloride flush  3 mL Intravenous Q12H   Continuous Infusions:   LOS: 4 days    Time spent in minutes: 4    [place], MD [facility] Pager: [email or web address] Password [facility] 06/12/2016, 12:55 PM

## 2016-06-12 NOTE — Care Management Important Message (Signed)
Important Message  Patient Details  Name: Tim Ross MRN: [id number] Date of Birth: 10-10-1934   Medicare Important Message Given:  Yes    Kerin Salen 06/12/2016, 11:53 AMImportant Message  Patient Details  Name: Tim Ross MRN: [id number] Date of Birth: 1934-11-08   Medicare Important Message Given:  Yes    Kerin Salen 06/12/2016, 11:53 AM

## 2016-06-13 ENCOUNTER — Other Ambulatory Visit: Payer: Self-pay | Admitting: *Deleted

## 2016-06-13 DIAGNOSIS — C3492 Malignant neoplasm of unspecified part of left bronchus or lung: Secondary | ICD-10-CM

## 2016-06-13 DIAGNOSIS — J181 Lobar pneumonia, unspecified organism: Secondary | ICD-10-CM

## 2016-06-13 LAB — BASIC METABOLIC PANEL
ANION GAP: 8 (ref 5–15)
BUN: 20 mg/dL (ref 6–20)
CALCIUM: 8 mg/dL — AB (ref 8.9–10.3)
CO2: 26 mmol/L (ref 22–32)
Chloride: 103 mmol/L (ref 101–111)
Creatinine, Ser: 1.38 mg/dL — ABNORMAL HIGH (ref 0.61–1.24)
GFR calc non Af Amer: 47 mL/min — ABNORMAL LOW (ref >60)
GFR, EST AFRICAN AMERICAN: 55 mL/min — AB (ref >60)
Glucose, Bld: 127 mg/dL — ABNORMAL HIGH (ref 65–99)
Potassium: 5 mmol/L (ref 3.5–5.1)
Sodium: 137 mmol/L (ref 135–145)

## 2016-06-13 LAB — CBC
HEMATOCRIT: 25.6 % — AB (ref 39.0–52.0)
Hemoglobin: 8.2 g/dL — ABNORMAL LOW (ref 13.0–17.0)
MCH: 29.8 pg (ref 26.0–34.0)
MCHC: 32 g/dL (ref 30.0–36.0)
MCV: 93.1 fL (ref 78.0–100.0)
PLATELETS: 239 10*3/uL (ref 150–400)
RBC: 2.75 MIL/uL — ABNORMAL LOW (ref 4.22–5.81)
RDW: 19.7 % — AB (ref 11.5–15.5)
WBC: 15.9 10*3/uL — AB (ref 4.0–10.5)

## 2016-06-13 MED ORDER — HYDROCOD POLST-CPM POLST ER 10-8 MG/5ML PO SUER: 5.0000 mL | Freq: Two times a day (BID) | ORAL | 0 refills | Status: DC | PRN

## 2016-06-13 MED ORDER — OXYCONTIN 10 MG PO T12A: 10.0000 mg | EXTENDED_RELEASE_TABLET | Freq: Two times a day (BID) | ORAL | 0 refills | Status: DC

## 2016-06-13 MED ORDER — BENZONATATE 200 MG PO CAPS: 200.0000 mg | ORAL_CAPSULE | Freq: Three times a day (TID) | ORAL | 0 refills | Status: DC | PRN

## 2016-06-13 MED ORDER — APIXABAN 5 MG PO TABS: 5.0000 mg | ORAL_TABLET | Freq: Two times a day (BID) | ORAL | 2 refills | Status: DC

## 2016-06-13 MED ORDER — LEVOFLOXACIN 750 MG PO TABS: 750.0000 mg | ORAL_TABLET | Freq: Every day | ORAL | 0 refills | Status: DC

## 2016-06-13 MED ORDER — OXYCODONE HCL 5 MG PO TABS: 5.0000 mg | ORAL_TABLET | ORAL | 0 refills | Status: DC | PRN

## 2016-06-13 NOTE — Discharge Summary (Signed)
Physician Discharge Summary  Tim Ross YHC:[id number] DOB: [birth year] DOA: 06/08/2016  PCP: Garret Reddish, MD  Admit date: 06/08/2016 Discharge date: 06/13/2016  Admitted From: home Disposition:  home   Recommendations for Outpatient Follow-up:  1. F/u on pleural effusion and pneumonia  Home Health:  ordered  Equipment/Devices:  walker    Discharge Condition:  stable   CODE STATUS:  Full code   Diet recommendation:  Regular diet with dietary supplements Consultations:  Medical and rad oncology   IR   Discharge Diagnoses:  Active Problems:   Pneumonia   Adenocarcinoma, lung (HCC)   Pleural effusion, bilateral    Subjective: No dyspnea or cough. Pain in chest is well controlled  Brief Summary: Tim A Redmonis a 82 y.o.malewith medical history significant of metastatic lung cancer and PE who presents to the hospital with worsening dyspnea. For last 4 days patient has been developing worsening dyspnea to the point where he is dyspneic with minimal efforts. His dyspnea is moderate to severe intensity, worse with exertion it has been associated with cough which has been productive with clear phlegm, wheezing, no fevers or chills. Positive generalized malaise and decreased energy. There seems to be no improvement factors to his dyspnea.  Hospital Course:  Acute respiratory failure.  - due to acute left pleural effusion superimposed on Malignancy and post obstructive pneumonia - S/p thoracentesis 1/22 with 2 L removed by IR -effusion is exudative and bloody and likely malignant - repeat CXR 1/25 (to ensure he has had no recurrence of effusion) shows only a small L effusion, infiltrate and cancer with lymphangitic spread -  will need to continue Levaquin for postobstructive pneumonia - follow closely for recurrence of effusion- have explained to patient and wife in detail that this may recur - will require O2 on discharge which I have prescribed    Pulmonary  embolism - Continue anticoagulation with Lovenox during the hospital stay- held apixaban for procedures while in the hospital   Metastatic adenocarcinoma of the lung  - Metastasis to bone, adrenal glands  - Dr Irene Limbo and Sondra Come following - will have L perihilar and T10 met radiation starting next week - Dr Irene Limbo considering immunotherapy - MRI brain negative for brain mets - Will continue OxyContin/oxycodone for pain. He needs new prescriptions which I have written for him  Depression - Continue mirtazapine  CKD 3 - stable  Anemia - Hb down from  8.3- 6.9 with hydration, transfused 2 units PRBC on 1/123  Hyperkalemia - AM lab showed K of 5.8- have repeated it- 5.0 >> 5.3- given kayexalate today- will follow  Discharge Instructions  Discharge Instructions    Diet - low sodium heart healthy    Complete by:  As directed    Increase activity slowly    Complete by:  As directed      Allergies as of 06/13/2016      Reactions   Celebrex [celecoxib] Hives      Medication List    TAKE these medications   apixaban 5 MG Tabs tablet Commonly known as:  ELIQUIS Take 1 tablet (5 mg total) by mouth 2 (two) times daily. Start tonight What changed:  additional instructions   benzonatate 200 MG capsule Commonly known as:  TESSALON Take 1 capsule (200 mg total) by mouth 3 (three) times daily as needed for cough.   chlorpheniramine-HYDROcodone 10-8 MG/5ML Suer Commonly known as:  TUSSIONEX Take 5 mLs by mouth every 12 (twelve) hours as needed for cough.  CINNAMON PO Take 2 tablets by mouth every morning.   feeding supplement (ENSURE ENLIVE) Liqd Take 237 mLs by mouth 2 (two) times daily between meals.   FISH OIL PO Take 1 capsule by mouth every morning.   folic acid 1 MG tablet Commonly known as:  FOLVITE TK 1 T PO D.START 5-7 DAYS BEFORE ALIMTA CHEMOTHERAPY.CONTINUE UNTIL 21 DAYS AFTER ALIMTA COMPLETED   ICAPS Caps Take 1 capsule by mouth 2 (two) times daily.    levofloxacin 750 MG tablet Commonly known as:  LEVAQUIN Take 1 tablet (750 mg total) by mouth daily.   LORazepam 0.5 MG tablet Commonly known as:  ATIVAN Take 1 tablet (0.5 mg total) by mouth every 8 (eight) hours as needed for anxiety. Can take 1 tab 30-40 mins prior to MRI to help with claustrophobia   mirtazapine 15 MG tablet Commonly known as:  REMERON Take 1 tablet (15 mg total) by mouth at bedtime.   multivitamin with minerals Tabs tablet Take 1 tablet by mouth every morning.   omeprazole 20 MG capsule Commonly known as:  PRILOSEC Take 1 capsule (20 mg total) by mouth daily.   ondansetron 8 MG tablet Commonly known as:  ZOFRAN Take 1 tablet (8 mg total) by mouth 2 (two) times daily as needed (Nausea or vomiting).   oxyCODONE 5 MG immediate release tablet Commonly known as:  ROXICODONE Take 1-2 tablets (5-10 mg total) by mouth every 4 (four) hours as needed for moderate pain, severe pain or breakthrough pain. What changed:  Another medication with the same name was changed. Make sure you understand how and when to take each.   OXYCONTIN 10 mg 12 hr tablet Generic drug:  oxyCODONE Take 1 tablet (10 mg total) by mouth every 12 (twelve) hours. What changed:  how much to take  how to take this  when to take this   polyethylene glycol packet Commonly known as:  MIRALAX Take 17 g by mouth daily.   prochlorperazine 10 MG tablet Commonly known as:  COMPAZINE Take 1 tablet (10 mg total) by mouth every 6 (six) hours as needed for nausea or vomiting. What changed:  Another medication with the same name was added. Make sure you understand how and when to take each.   prochlorperazine 10 MG tablet Commonly known as:  COMPAZINE Take 1 tablet (10 mg total) by mouth every 6 (six) hours as needed (Nausea or vomiting). What changed:  You were already taking a medication with the same name, and this prescription was added. Make sure you understand how and when to take each.    senna-docusate 8.6-50 MG tablet Commonly known as:  Senokot-S Take 2 tablets by mouth 2 (two) times daily.            Durable Medical Equipment        Start     Ordered   06/13/16 1004  For home use only DME oxygen  Once    Question Answer Comment  Mode or (Route) Nasal cannula   Liters per Minute 2   Oxygen conserving device Yes   Oxygen delivery system Gas      06/13/16 1003      Allergies  Allergen Reactions  . Celebrex [Celecoxib] Hives     Procedures/Studies:    Dg Chest 1 View  Result Date: 06/09/2016 CLINICAL DATA:  Status post left thoracentesis. EXAM: CHEST 1 VIEW COMPARISON:  06/09/2016. Chest CT dated 05/27/2016. PET-CT dated 03/05/2016. FINDINGS: Normal sized heart. Mildly decreased left pleural  fluid. Further increase in interstitial opacity in the left lung. No pneumothorax. The previously demonstrated right fourth posterior rib bone island is partially obscured by no and overlying lead. Patchy sclerosis and lucency in the right humeral head with progressive sclerosis. IMPRESSION: 1. No pneumothorax following left thoracentesis. 2. Mildly decreased left pleural fluid. 3. Further progression of interstitial spread of lung cancer on the left. 4. Increased sclerosis associated with the previously demonstrated right humeral head metastasis. Electronically Signed   By: Claudie Revering M.D.   On: 06/09/2016 15:22   Dg Chest 2 View  Result Date: 06/12/2016 CLINICAL DATA:  Shortness of breath, cough, right chest pain, lung cancer EXAM: CHEST  2 VIEW COMPARISON:  06/07/2016 FINDINGS: Similar diffuse left lung mixed airspace and interstitial opacities compatible with known lung cancer and diffuse lymphangitic or interstitial spread. Similar small left pleural effusion. Difficult to exclude superimposed pneumonia. Right lung remains hyper aerated and clear. Trachea is midline. Normal heart size and vascularity. Aorta is atherosclerotic. Bones are osteopenic and degenerative  changes noted of the spine. Stable sclerotic changes in the right humerus proximally. IMPRESSION: Stable diffuse left lung mixed airspace and interstitial opacities compatible with known left lung cancer and diffuse lymphangitic tumor spread. Difficult to exclude superimposed pneumonia. Stable small left effusion No significant interval change since 06/09/2016 Electronically Signed   By: Jerilynn Mages.  Shick M.D.   On: 06/12/2016 09:10   Dg Chest 2 View  Result Date: 06/08/2016 CLINICAL DATA:  Lung cancer in shortness of breath, worsening recently. EXAM: CHEST  2 VIEW COMPARISON:  Radiography 03/09/2016.  CT 05/27/2016. FINDINGS: Heart size is normal. There is worsening of patchy attic spread of cancer throughout the left lung. Moderate size right effusion with atelectasis in the right lower lung. Question if there could be developing interstitial spread in the right perihilar region now. Bone island in the right fourth rib again noted. IMPRESSION: Worsened interstitial spread of cancer within the left lung. Left effusion with left base atelectasis. Question early development of perihilar interstitial spread on the right. Certainly, coexistent infectious pneumonia in the left lung is not excluded. Electronically Signed   By: Nelson Chimes M.D.   On: 06/08/2016 12:43   Ct Chest W Contrast  Result Date: 05/27/2016 CLINICAL DATA:  Left lung cancer staging with metastatic disease to bone adrenal glands. EXAM: CT CHEST, ABDOMEN, AND PELVIS WITH CONTRAST TECHNIQUE: Multidetector CT imaging of the chest, abdomen and pelvis was performed following the standard protocol during bolus administration of intravenous contrast. CONTRAST:  157m ISOVUE-300 IOPAMIDOL (ISOVUE-300) INJECTION 61% COMPARISON:  CT chest from 02/29/2016. Abdomen and pelvis CT from 11/08/2015. PET-CT from 03/05/2016. FINDINGS: CT CHEST FINDINGS Cardiovascular: Heart size is normal. Stable appearance trace anterior pericardial fluid. Coronary artery  calcification is noted. Atherosclerotic calcification is noted in the wall of the thoracic aorta. Mediastinum/Nodes: 8 mm short axis prevascular lymph node on image 28 series 2 is measured at 15 mm on the previous PET-CT. 12 mm short axis left hilar lymph node seen on image 30 was 18 mm when I remeasure it on the prior exam. 18 mm short axis subcarinal lymph node was 20 mm previously. No right hilar lymphadenopathy. Abnormal soft tissue attenuation in the left hilum is progressive in the interval. Lungs/Pleura: Persistent interlobular septal thickening and confluent airspace opacity is identified in the posterior left upper lobe, progressive in the interval. There is now a 5.7 x 6.9 cm masslike area of confluent airspace opacity, markedly progressive in the interval. This is  in the region of hypermetabolism seen on the previous PET-CT. 11 mm posterior right upper lobe pulmonary nodule (image 77 series 7) is new in the interval. Scattered areas of peribronchovascular nodularity in the posterior right upper lobe suggest atypical infection. Musculoskeletal: Lytic lesions noted right humeral head. Compression deformity of an upper thoracic vertebral body is unchanged. 3.2 cm lytic lesion in the T10 vertebral body has progressed in the interval. CT ABDOMEN PELVIS FINDINGS Hepatobiliary: Scattered tiny hypodensities in the liver parenchyma were present previously are not substantially changed in the interval. These remain too small to characterize. Pancreas: No focal mass lesion. No dilatation of the main duct. No intraparenchymal cyst. No peripancreatic edema. Spleen: No splenomegaly. No focal mass lesion. Adrenals/Urinary Tract: Stable appearance 3.2 cm left adrenal lesion. Right adrenal gland remains normal in appearance. Cysts are identified in the kidneys bilaterally without appreciable interval change. No evidence for hydroureter. The urinary bladder appears normal for the degree of distention. Stomach/Bowel:  Stomach is nondistended. No gastric wall thickening. No evidence of outlet obstruction. Duodenum is normally positioned as is the ligament of Treitz. No small bowel wall thickening. No small bowel dilatation. The terminal ileum is normal. The appendix is normal. Diverticular changes are noted in the left colon without evidence of diverticulitis. Vascular/Lymphatic: There is no gastrohepatic or hepatoduodenal ligament lymphadenopathy. No intraperitoneal or retroperitoneal lymphadenopathy. There is abdominal aortic atherosclerosis without aneurysm. No pelvic sidewall lymphadenopathy. Reproductive: The prostate gland and seminal vesicles have normal imaging features. Other: No intraperitoneal free fluid. Musculoskeletal: No suspicious lytic or sclerotic bony abnormality in the abdomen and pelvis. IMPRESSION: 1. Marked interval progression of confluent airspace opacity in the parahilar left lung, highly suspicious for disease progression. This region was hypermetabolic on the previous PET-CT and appears more confluent than typically seen for post radiation change. 2. Evidence of decreasing size mediastinal lymphadenopathy. 3. Interval development of a small posterior right upper lobe pulmonary nodule. Continued attention on follow-up recommended. 4. Interval progression of the lucent lesion in T10, seen to be hypermetabolic on previous PET-CT. Lesions in the right humeral head are incompletely visualized on today's study. 5. No change 3.2 cm left adrenal lesion, indeterminate by CT imaging. 6. Interval progression small left pleural effusion. 7.  Abdominal Aortic Atherosclerois (ICD10-170.0) 8.  Emphysema. ([id number]) Electronically Signed   By: Misty Stanley M.D.   On: 05/27/2016 13:14   Mr Brain Wo Contrast  Result Date: 06/10/2016 CLINICAL DATA:  Initial evaluation for history of metastatic lung cancer and P with worsening dyspnea. EXAM: MRI HEAD WITHOUT CONTRAST TECHNIQUE: Multiplanar, multiecho pulse  sequences of the brain and surrounding structures were obtained without intravenous contrast. COMPARISON:  Previous MRI from 03/10/2016. FINDINGS: Brain: Study is technically limited as the patient was unable to tolerate the full length of the exam. Only diffusion-weighted imaging, sagittal T1, with axial T2 and FLAIR sequences were performed. Additionally, images provided are somewhat degraded by motion artifact. Generalized age-related cerebral atrophy is stable from previous. Mild for age chronic microvascular ischemic changes also similar. No abnormal foci of restricted diffusion to suggest acute or subacute ischemia. Gray-white matter differentiation maintained. No evidence for chronic infarction. No mass lesion, midline shift, or mass effect. Ventricles normal in size without evidence for hydrocephalus. Small right frontal hygroma is stable from previous. No imaging findings to suggest metastatic disease identified on this limited noncontrast examination. Pituitary gland and suprasellar region within normal limits. Vascular: Major intracranial vascular flow voids are maintained. Skull and upper cervical spine: Craniocervical junction normal.  Degenerative thickening noted at the tectorial membrane without significant stenosis. Visualized upper cervical spine otherwise unremarkable. Bone marrow signal intensity within normal limits. No scalp soft tissue abnormality. Sinuses/Orbits: Globes and orbital soft tissues within normal limits. Patient is status post cataract extraction bilaterally. Mild scattered mucosal thickening within the ethmoidal air cells. Visualized paranasal sinuses are otherwise clear. No mastoid effusion. Inner ear structures grossly normal. IMPRESSION: 1. Technically limited study due to patient's inability to tolerate the full length of the exam and motion artifact. 2. No acute intracranial process identified. No evidence for intracranial metastatic disease on this limited noncontrast  examination. 3. Stable small right frontal hygroma. Electronically Signed   By: Jeannine Boga M.D.   On: 06/10/2016 22:13   Ct Abdomen Pelvis W Contrast  Result Date: 05/27/2016 CLINICAL DATA:  Left lung cancer staging with metastatic disease to bone adrenal glands. EXAM: CT CHEST, ABDOMEN, AND PELVIS WITH CONTRAST TECHNIQUE: Multidetector CT imaging of the chest, abdomen and pelvis was performed following the standard protocol during bolus administration of intravenous contrast. CONTRAST:  119m ISOVUE-300 IOPAMIDOL (ISOVUE-300) INJECTION 61% COMPARISON:  CT chest from 02/29/2016. Abdomen and pelvis CT from 11/08/2015. PET-CT from 03/05/2016. FINDINGS: CT CHEST FINDINGS Cardiovascular: Heart size is normal. Stable appearance trace anterior pericardial fluid. Coronary artery calcification is noted. Atherosclerotic calcification is noted in the wall of the thoracic aorta. Mediastinum/Nodes: 8 mm short axis prevascular lymph node on image 28 series 2 is measured at 15 mm on the previous PET-CT. 12 mm short axis left hilar lymph node seen on image 30 was 18 mm when I remeasure it on the prior exam. 18 mm short axis subcarinal lymph node was 20 mm previously. No right hilar lymphadenopathy. Abnormal soft tissue attenuation in the left hilum is progressive in the interval. Lungs/Pleura: Persistent interlobular septal thickening and confluent airspace opacity is identified in the posterior left upper lobe, progressive in the interval. There is now a 5.7 x 6.9 cm masslike area of confluent airspace opacity, markedly progressive in the interval. This is in the region of hypermetabolism seen on the previous PET-CT. 11 mm posterior right upper lobe pulmonary nodule (image 77 series 7) is new in the interval. Scattered areas of peribronchovascular nodularity in the posterior right upper lobe suggest atypical infection. Musculoskeletal: Lytic lesions noted right humeral head. Compression deformity of an upper thoracic  vertebral body is unchanged. 3.2 cm lytic lesion in the T10 vertebral body has progressed in the interval. CT ABDOMEN PELVIS FINDINGS Hepatobiliary: Scattered tiny hypodensities in the liver parenchyma were present previously are not substantially changed in the interval. These remain too small to characterize. Pancreas: No focal mass lesion. No dilatation of the main duct. No intraparenchymal cyst. No peripancreatic edema. Spleen: No splenomegaly. No focal mass lesion. Adrenals/Urinary Tract: Stable appearance 3.2 cm left adrenal lesion. Right adrenal gland remains normal in appearance. Cysts are identified in the kidneys bilaterally without appreciable interval change. No evidence for hydroureter. The urinary bladder appears normal for the degree of distention. Stomach/Bowel: Stomach is nondistended. No gastric wall thickening. No evidence of outlet obstruction. Duodenum is normally positioned as is the ligament of Treitz. No small bowel wall thickening. No small bowel dilatation. The terminal ileum is normal. The appendix is normal. Diverticular changes are noted in the left colon without evidence of diverticulitis. Vascular/Lymphatic: There is no gastrohepatic or hepatoduodenal ligament lymphadenopathy. No intraperitoneal or retroperitoneal lymphadenopathy. There is abdominal aortic atherosclerosis without aneurysm. No pelvic sidewall lymphadenopathy. Reproductive: The prostate gland and seminal vesicles  have normal imaging features. Other: No intraperitoneal free fluid. Musculoskeletal: No suspicious lytic or sclerotic bony abnormality in the abdomen and pelvis. IMPRESSION: 1. Marked interval progression of confluent airspace opacity in the parahilar left lung, highly suspicious for disease progression. This region was hypermetabolic on the previous PET-CT and appears more confluent than typically seen for post radiation change. 2. Evidence of decreasing size mediastinal lymphadenopathy. 3. Interval development  of a small posterior right upper lobe pulmonary nodule. Continued attention on follow-up recommended. 4. Interval progression of the lucent lesion in T10, seen to be hypermetabolic on previous PET-CT. Lesions in the right humeral head are incompletely visualized on today's study. 5. No change 3.2 cm left adrenal lesion, indeterminate by CT imaging. 6. Interval progression small left pleural effusion. 7.  Abdominal Aortic Atherosclerois (ICD10-170.0) 8.  Emphysema. ([id number]-P38.9) Electronically Signed   By: Misty Stanley M.D.   On: 05/27/2016 13:14   US Thoracentesis Asp Pleural Space W/img Guide  Result Date: 06/09/2016 INDICATION: Patient with a history of lung cancer with metastatic disease. Left pleural effusion noted on recent imaging. Request is made for diagnostic and therapeutic thoracentesis. EXAM: ULTRASOUND GUIDED DIAGNOSTIC AND THERAPEUTIC THORACENTESIS MEDICATIONS: 1% lidocaine COMPLICATIONS: None immediate. PROCEDURE: An ultrasound guided thoracentesis was thoroughly discussed with the patient and questions answered. The benefits, risks, alternatives and complications were also discussed. The patient understands and wishes to proceed with the procedure. Written consent was obtained. Ultrasound was performed to localize and mark an adequate pocket of fluid in the left chest. The area was then prepped and draped in the normal sterile fashion. 1% Lidocaine was used for local anesthesia. Under ultrasound guidance a Safe-T-Centesis catheter was introduced. Thoracentesis was performed. The catheter was removed and a dressing applied. FINDINGS: A total of approximately 2 L of bloody fluid was removed. Samples were sent to the laboratory as requested by the clinical team. IMPRESSION: Successful ultrasound guided left thoracentesis yielding 2 L of pleural fluid. Read by: Saverio Danker, PA-C Electronically Signed   By: Lucrezia Europe M.D.   On: 06/09/2016 15:41       Discharge Exam: Vitals:   06/13/16 0537  06/13/16 0728  BP: (!) 117/57   Pulse: (!) 114 (!) 102  Resp: 18 18  Temp: 99.2 F (37.3 C)    Vitals:   06/12/16 1926 06/12/16 2100 06/13/16 0537 06/13/16 0728  BP:  (!) 101/49 (!) 117/57   Pulse: (!) 115 (!) 112 (!) 114 (!) 102  Resp: _0 Temp:  98.6 F (37 C) 99.2 F (37.3 C)   TempSrc:  Oral Oral   SpO2: 93% 93% 91% 92%  Weight:      Height:        General: Pt is alert, awake, not in acute distress Cardiovascular: RRR, S1/S2 +, no rubs, no gallops Respiratory: CTA bilaterally, no wheezing, no rhonchi Abdominal: Soft, NT, ND, bowel sounds + Extremities: no edema, no cyanosis    The results of significant diagnostics from this hospitalization (including imaging, microbiology, ancillary and laboratory) are listed below for reference.     Microbiology: Recent Results (from the past 240 hour(s))  Culture, body fluid-bottle     Status: None (Preliminary result)   Collection Time: 06/09/16  3:24 PM  Result Value Ref Range Status   Specimen Description FLUID LEFT PLEURAL  Final   Special Requests BOTTLES DRAWN AEROBIC AND ANAEROBIC 10CC  Final   Culture   Final    NO GROWTH 4 DAYS Performed at  [facility], [place] [address] [place], [place] [address]    Report Status PENDING  Incomplete  Gram stain     Status: None   Collection Time: 06/09/16  3:24 PM  Result Value Ref Range Status   Specimen Description FLUID LEFT PLEURAL  Final   Special Requests NONE  Final   Gram Stain   Final    ABUNDANT WBC PRESENT,BOTH PMN AND MONONUCLEAR NO ORGANISMS SEEN Performed at [facility], [address], [place], [place] [address]    Report Status 06/09/2016 FINAL  Final     Labs: BNP (last 3 results)  Recent Labs  03/09/16 1456  BNP 81.1   Basic Metabolic Panel:  Recent Labs Lab 06/10/16 0528 06/11/16 0426 06/11/16 0844 06/12/16 0539 06/12/16 1312 06/13/16 0539  NA 136 133*  --  133* 134* 137  K 4.8 5.8* 5.0 5.3* 4.6 5.0  CL 106 104  --   103 100* 103  CO2 24 22  --  _0 GLUCOSE 122* 114*  --  127* 117* 127*  BUN 17 16  --  _1 CREATININE 1.35* 1.27*  --  1.31* 1.56* 1.38*  CALCIUM 7.6* 7.8*  --  8.1* 8.2* 8.0*   Liver Function Tests:  Recent Labs Lab 06/08/16 1220 06/09/16 0519  AST 36 36  ALT 37 34  ALKPHOS 73 63  BILITOT 0.6 0.3  PROT 6.6 5.8*  ALBUMIN 2.5* 2.3*   No results for input(s): LIPASE, AMYLASE in the last 168 hours. No results for input(s): AMMONIA in the last 168 hours. CBC:  Recent Labs Lab 06/08/16 1220  06/10/16 0528 06/10/16 2355 06/11/16 0426 06/12/16 0539 06/12/16 1312 06/13/16 0539  WBC 15.9*  < > 9.6  --  14.4* 17.8* 19.2* 15.9*  NEUTROABS 11.3*  --   --   --   --   --   --   --   HGB 8.3*  < > 6.9* 9.1* 8.7* 9.3* 8.6* 8.2*  HCT 26.4*  < > 21.4* 27.5* 26.8* 28.4* 26.1* 25.6*  MCV 93.3  < > 92.2  --  89.9 93.1 93.2 93.1  PLT 262  < > 219  --  234 257 285 239  < > = values in this interval not displayed. Cardiac Enzymes:  Recent Labs Lab 06/08/16 1220  TROPONINI <0.03   BNP: Invalid input(s): POCBNP CBG: No results for input(s): GLUCAP in the last 168 hours. D-Dimer No results for input(s): DDIMER in the last 72 hours. Hgb A1c No results for input(s): HGBA1C in the last 72 hours. Lipid Profile No results for input(s): CHOL, HDL, LDLCALC, TRIG, CHOLHDL, LDLDIRECT in the last 72 hours. Thyroid function studies No results for input(s): TSH, T4TOTAL, T3FREE, THYROIDAB in the last 72 hours.  Invalid input(s): FREET3 Anemia work up No results for input(s): VITAMINB12, FOLATE, FERRITIN, TIBC, IRON, RETICCTPCT in the last 72 hours. Urinalysis    Component Value Date/Time   COLORURINE YELLOW 03/09/2016 [place] 03/09/2016 1754   LABSPEC 1.023 03/09/2016 1754   PHURINE 5.5 03/09/2016 1754   GLUCOSEU NEGATIVE 03/09/2016 1754   HGBUR NEGATIVE 03/09/2016 1754   BILIRUBINUR NEGATIVE 03/09/2016 1754   BILIRUBINUR n 06/07/2014 0929   KETONESUR  NEGATIVE 03/09/2016 1754   PROTEINUR NEGATIVE 03/09/2016 1754   UROBILINOGEN 0.2 06/07/2014 0929   NITRITE NEGATIVE 03/09/2016 1754   LEUKOCYTESUR NEGATIVE 03/09/2016 1754   Sepsis Labs Invalid input(s): PROCALCITONIN,  WBC,  LACTICIDVEN  Microbiology Recent Results (from the past 240 hour(s))  Culture, body fluid-bottle     Status: None (Preliminary result)   Collection Time: 06/09/16  3:24 PM  Result Value Ref Range Status   Specimen Description FLUID LEFT PLEURAL  Final   Special Requests BOTTLES DRAWN AEROBIC AND ANAEROBIC 10CC  Final   Culture   Final    NO GROWTH 4 DAYS Performed at [facility], [place] [address], [place], [place] [address]    Report Status PENDING  Incomplete  Gram stain     Status: None   Collection Time: 06/09/16  3:24 PM  Result Value Ref Range Status   Specimen Description FLUID LEFT PLEURAL  Final   Special Requests NONE  Final   Gram Stain   Final    ABUNDANT WBC PRESENT,BOTH PMN AND MONONUCLEAR NO ORGANISMS SEEN Performed at [facility], [address], [place], [place] [address]    Report Status 06/09/2016 FINAL  Final     Time coordinating discharge: Over 30 minutes  SIGNED:   Debbe Odea, MD  [facility] 06/13/2016, 11:16 AM Pager   If 7PM-7AM, please contact night-coverage [email or web address] Password [email or web address]

## 2016-06-13 NOTE — Progress Notes (Signed)
Physical Therapy Treatment Patient Details Name: Tim Ross MRN: [id number] DOB: 1935-05-02 Today's Date: 29-Jun-2016    History of Present Illness Tim Ross is a 82 y.o. 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  Pt plans to d/c home later today.  Recommended pt continue to use RW upon d/c.  Follow Up Recommendations  Home health PT (and HHOT)     Equipment Recommendations  Rolling walker with 5" wheels    Recommendations for Other Services       Precautions / Restrictions Precautions Precautions: Fall Precaution Comments: monitor sats Restrictions Weight Bearing Restrictions: No    Mobility  Bed Mobility Overal bed mobility: Needs Assistance Bed Mobility: Supine to Sit     Supine to sit: Supervision     General bed mobility comments: for safety  Transfers Overall transfer level: Needs assistance Equipment used: Rolling walker (2 wheeled) Transfers: Sit to/from Stand Sit to Stand: Min guard         General transfer comment: verbal cues for hand placement  Ambulation/Gait Ambulation/Gait assistance: Min guard Ambulation Distance (Feet): 120 Feet Assistive device: Rolling walker (2 wheeled) Gait Pattern/deviations: Step-through pattern;Trunk flexed;Decreased stride length     General Gait Details: verbal cues for RW positioning, maintained 2L O2 [place], SpO2 91% on 2L O2 upon return to room   Stairs            Wheelchair Mobility    Modified Rankin (Stroke Patients Only)       Balance                                    Cognition Arousal/Alertness: Awake/alert Behavior During Therapy: WFL for tasks assessed/performed Overall Cognitive Status: Within Functional Limits for tasks assessed                      Exercises      General Comments        Pertinent Vitals/Pain Pain Assessment: No/denies pain    Home Living                      Prior Function            PT Goals (current goals can now be found in the care plan section) Progress towards PT goals: Progressing toward goals    Frequency    Min 3X/week      PT Plan Current plan remains appropriate    Co-evaluation             End of Session Equipment Utilized During Treatment: Gait belt;Oxygen Activity Tolerance: Patient tolerated treatment well Patient left: in bed;with call bell/phone within reach     Time: 1021-1030 PT Time Calculation (min) (ACUTE ONLY): 9 min  Charges:  $Gait Training: 8-22 mins                    G Codes:      Aleksandar Duve,KATHrine E 07/03/2016, 11:46 AM Carmelia Bake, PT, DPT 07/01/2016 Pager: [phone]

## 2016-06-13 NOTE — Care Management Note (Signed)
Case Management Note  Patient Details  Name: Tim Ross MRN: [id number] Date of Birth: 1935/02/22  Subjective/Objective:  MD notified of home 02 roder needing frequency added, [place] orders,f76f& home rw order. [facility] following.                  Action/Plan:d/c home w/HHC.   Expected Discharge Date:  06/13/16               Expected Discharge Plan:  [facility] Referral:     Discharge planning Services  CM Consult  Post Acute Care Choice:    Choice offered to:  Patient  DME Arranged:  Walker rolling DME Agency:     HH Arranged:  PT, OT HH Agency:  [facility] Status of Service:  Completed, signed off  If discussed at [place] Stay Meetings, dates discussed:    Additional Comments:  MDessa Phi RN 06/13/2016, 12:00 PM

## 2016-06-14 ENCOUNTER — Emergency Department (HOSPITAL_COMMUNITY): Payer: Medicare Other

## 2016-06-14 ENCOUNTER — Emergency Department (HOSPITAL_COMMUNITY)
Admission: EM | Admit: 2016-06-14 | Discharge: 2016-06-14 | Disposition: A | Discharge status: Home / Self Care | Payer: Medicare Other | Attending: Emergency Medicine | Admitting: Emergency Medicine

## 2016-06-14 ENCOUNTER — Encounter (HOSPITAL_COMMUNITY): Payer: Self-pay

## 2016-06-14 DIAGNOSIS — R5381 Other malaise: Secondary | ICD-10-CM | POA: Insufficient documentation

## 2016-06-14 DIAGNOSIS — Z87891 Personal history of nicotine dependence: Secondary | ICD-10-CM | POA: Insufficient documentation

## 2016-06-14 DIAGNOSIS — Z85830 Personal history of malignant neoplasm of bone: Secondary | ICD-10-CM | POA: Insufficient documentation

## 2016-06-14 DIAGNOSIS — R0689 Other abnormalities of breathing: Secondary | ICD-10-CM

## 2016-06-14 DIAGNOSIS — Z7902 Long term (current) use of antithrombotics/antiplatelets: Secondary | ICD-10-CM | POA: Diagnosis not present

## 2016-06-14 DIAGNOSIS — Z79899 Other long term (current) drug therapy: Secondary | ICD-10-CM | POA: Diagnosis not present

## 2016-06-14 DIAGNOSIS — N183 Chronic kidney disease, stage 3 (moderate): Secondary | ICD-10-CM | POA: Diagnosis not present

## 2016-06-14 DIAGNOSIS — R0989 Other specified symptoms and signs involving the circulatory and respiratory systems: Secondary | ICD-10-CM | POA: Insufficient documentation

## 2016-06-14 DIAGNOSIS — I129 Hypertensive chronic kidney disease with stage 1 through stage 4 chronic kidney disease, or unspecified chronic kidney disease: Secondary | ICD-10-CM | POA: Diagnosis not present

## 2016-06-14 DIAGNOSIS — M6281 Muscle weakness (generalized): Secondary | ICD-10-CM | POA: Diagnosis not present

## 2016-06-14 DIAGNOSIS — J984 Other disorders of lung: Secondary | ICD-10-CM | POA: Diagnosis not present

## 2016-06-14 DIAGNOSIS — R0602 Shortness of breath: Secondary | ICD-10-CM | POA: Diagnosis present

## 2016-06-14 LAB — CULTURE, BODY FLUID-BOTTLE

## 2016-06-14 LAB — CULTURE, BODY FLUID W GRAM STAIN -BOTTLE: Culture: NO GROWTH

## 2016-06-14 NOTE — ED Notes (Signed)
He returns from x-ray at this time. He looks much better; and tells me he feels "a lot better". He is very mildly short of breath and in no distress.

## 2016-06-14 NOTE — ED Provider Notes (Signed)
[facility] DEPT Provider Note   CSN: [id number] Arrival date & time: 06/14/16  9563     History   Chief Complaint Chief Complaint  Patient presents with  . CA Pt  . Shortness of Breath    HPI Tim Ross is a 82 y.o. male.  The history is provided by the patient and the spouse.  Shortness of Breath  This is a new problem. The average episode lasts 20 minutes. The problem occurs continuously.The current episode started 3 to 5 hours ago. The problem has been resolved. Associated symptoms include cough (after choking on water when trying to swallow). Pertinent negatives include no fever. He has tried nothing for the symptoms. He has had prior hospitalizations. He has had prior ED visits. Associated medical issues include chronic lung disease (lung ca and pleural effusions with post-obstructive pneumonia).    Past Medical History:  Diagnosis Date  . Cancer ([place])   . Colonic polyp   . GERD (gastroesophageal reflux disease)   . History of radiation therapy 04/07/16-04/23/16   right humerus 30 Gy in 10 fractions  . Hyperlipidemia   . Hypertension   . Muscular degeneration   . Pneumonia 2002  . Testicular cyst    removed-noncancerous    Patient Active Problem List   Diagnosis Date Noted  . Adenocarcinoma, lung ([place])   . Pleural effusion, bilateral   . Pneumonia 06/08/2016  . Goals of care, counseling/discussion 06/08/2016  . Adenocarcinoma of left lung, stage 4 ([place]) 03/27/2016  . Bone metastases ([place]) 03/27/2016  . DVT of lower extremity, bilateral ([place]) 03/14/2016  . Metastatic Non-small cell lung cancer  03/14/2016  . Acute gout of right foot 03/14/2016  . Malnutrition of moderate degree 03/12/2016  . Acute blood loss anemia   . Neoplasm related pain   . GI bleed 03/09/2016  . Acute pulmonary embolism ([place]) 03/04/2016  . CKD (chronic kidney disease), stage III 06/08/2014  . Hyperglycemia 06/08/2014  . Former smoker 05/29/2014  . Macular degeneration   .  Hyperlipidemia 04/29/2007  . Essential hypertension 04/29/2007  . GERD 04/29/2007  . History of colonic polyps 04/29/2007    Past Surgical History:  Procedure Laterality Date  . CATARACT EXTRACTION     right side  . COLONOSCOPY N/A 03/14/2016   Procedure: COLONOSCOPY;  Surgeon: Doran Stabler, MD;  Location: [facility] ENDOSCOPY;  Service: Endoscopy;  Laterality: N/A;  . ESOPHAGOGASTRODUODENOSCOPY N/A 03/11/2016   Procedure: ESOPHAGOGASTRODUODENOSCOPY (EGD);  Surgeon: Doran Stabler, MD;  Location: [facility];  Service: Endoscopy;  Laterality: N/A;  . GIVENS CAPSULE STUDY N/A 03/14/2016   Procedure: GIVENS CAPSULE STUDY;  Surgeon: Doran Stabler, MD;  Location: [facility] ENDOSCOPY;  Service: Endoscopy;  Laterality: N/A;  . KNEE SURGERY     scope on both  . LUMBAR LAMINECTOMY    . TRANSURETHRAL RESECTION OF PROSTATE     resolved issues       Home Medications    Prior to Admission medications   Medication Sig Start Date End Date Taking? Authorizing Provider  apixaban (ELIQUIS) 5 MG TABS tablet Take 1 tablet (5 mg total) by mouth 2 (two) times daily. Start tonight 06/13/16  Yes Debbe Odea, MD  CINNAMON PO Take 2 tablets by mouth every morning.    Yes Historical Provider, MD  feeding supplement, ENSURE ENLIVE, (ENSURE ENLIVE) LIQD Take 237 mLs by mouth 2 (two) times daily between meals. 03/16/16  Yes [place] Marisa Hua, MD  folic acid (FOLVITE) 1 MG tablet  TK 1 T PO D.START 5-7 DAYS BEFORE ALIMTA CHEMOTHERAPY.CONTINUE UNTIL 21 DAYS AFTER ALIMTA COMPLETED 03/27/16  Yes Historical Provider, MD  LORazepam (ATIVAN) 0.5 MG tablet Take 1 tablet (0.5 mg total) by mouth every 8 (eight) hours as needed for anxiety. Can take 1 tab 30-40 mins prior to MRI to help with claustrophobia 06/03/16  Yes Gautam Juleen China, MD  mirtazapine (REMERON) 15 MG tablet Take 1 tablet (15 mg total) by mouth at bedtime. 04/14/16  Yes Brunetta Genera, MD  Multiple Vitamin (MULTIVITAMIN WITH MINERALS) TABS tablet  Take 1 tablet by mouth every morning.    Yes Historical Provider, MD  Multiple Vitamins-Minerals (ICAPS) CAPS Take 1 capsule by mouth 2 (two) times daily.   Yes Historical Provider, MD  Omega-3 Fatty Acids (FISH OIL PO) Take 1 capsule by mouth every morning.    Yes Historical Provider, MD  omeprazole (PRILOSEC) 20 MG capsule Take 1 capsule (20 mg total) by mouth daily. 03/16/16  Yes [place] Marisa Hua, MD  ondansetron (ZOFRAN) 8 MG tablet Take 1 tablet (8 mg total) by mouth 2 (two) times daily as needed (Nausea or vomiting). 06/08/16  Yes Gautam Juleen China, MD  oxyCODONE (ROXICODONE) 5 MG immediate release tablet Take 1-2 tablets (5-10 mg total) by mouth every 4 (four) hours as needed for moderate pain, severe pain or breakthrough pain. 06/13/16  Yes Debbe Odea, MD  OXYCONTIN 10 MG 12 hr tablet Take 1 tablet (10 mg total) by mouth every 12 (twelve) hours. 06/13/16  Yes Debbe Odea, MD  polyethylene glycol (MIRALAX) packet Take 17 g by mouth daily. 03/27/16  Yes Gautam Juleen China, MD  prochlorperazine (COMPAZINE) 10 MG tablet Take 1 tablet (10 mg total) by mouth every 6 (six) hours as needed (Nausea or vomiting). 06/08/16  Yes Brunetta Genera, MD  senna-docusate (SENOKOT-S) 8.6-50 MG tablet Take 2 tablets by mouth 2 (two) times daily. 03/27/16  Yes Gautam Juleen China, MD  benzonatate (TESSALON) 200 MG capsule Take 1 capsule (200 mg total) by mouth 3 (three) times daily as needed for cough. 06/13/16   Debbe Odea, MD  chlorpheniramine-HYDROcodone (TUSSIONEX) 10-8 MG/5ML SUER Take 5 mLs by mouth every 12 (twelve) hours as needed for cough. 06/13/16   Debbe Odea, MD  levofloxacin (LEVAQUIN) 750 MG tablet Take 1 tablet (750 mg total) by mouth daily. 06/13/16   Debbe Odea, MD  prochlorperazine (COMPAZINE) 10 MG tablet Take 1 tablet (10 mg total) by mouth every 6 (six) hours as needed for nausea or vomiting. Patient not taking: Reported on 06/14/2016 04/03/16   Brunetta Genera, MD    Family  History Family History  Problem Relation Age of Onset  . Diabetes Mother   . Hypertension Mother   . Heart attack Father     late 51s, former smoker  . Heart disease Brother     84s, smokers  . Colon cancer Sister     Social History Social History  Substance Use Topics  . Smoking status: Former Smoker    Packs/day: 1.50    Years: 40.00    Types: Cigarettes    Quit date: 12/30/1990  . Smokeless tobacco: Never Used  . Alcohol use No     Allergies   Celebrex [celecoxib]   Review of Systems Review of Systems  Constitutional: Negative for fever.  Respiratory: Positive for cough (after choking on water when trying to swallow) and shortness of breath.   All other systems reviewed and are negative.    Physical  Exam Updated Vital Signs BP 127/68   Pulse 110   Temp 98 F (36.7 C) (Oral)   Resp (!) 32   Ht '6\' 1"'$  (1.854 m)   Wt 180 lb (81.6 kg)   SpO2 92%   BMI 23.75 kg/m   Physical Exam  Constitutional: He is oriented to person, place, and time. He appears well-developed and well-nourished. No distress.  HENT:  Head: Normocephalic and atraumatic.  Nose: Nose normal.  Eyes: Conjunctivae are normal.  Neck: Neck supple. No tracheal deviation present.  Cardiovascular: Regular rhythm.  Tachycardia present.   Pulmonary/Chest: Effort normal. Tachypnea (mild, resolving throughout ED course) noted. No respiratory distress. He has decreased breath sounds (on left). He has rhonchi (left sided).  Abdominal: Soft. He exhibits no distension.  Neurological: He is alert and oriented to person, place, and time.  Skin: Skin is warm and dry.  Psychiatric: He has a normal mood and affect.     ED Treatments / Results  Labs (all labs ordered are listed, but only abnormal results are displayed) Labs Reviewed - No data to display  EKG  EKG Interpretation  Date/Time:  Saturday June 14 2016 08:56:08 EST Ventricular Rate:  121 PR Interval:    QRS Duration: 107 QT  Interval:  322 QTC Calculation: 453 R Axis:   -34 Text Interpretation:  Sinus tachycardia Ventricular premature complex Left axis deviation Anterior infarct, old Borderline repolarization abnormality No significant change since last tracing Confirmed by Tinzlee Craker MD, Cassandria Drew [phone]) on 06/14/2016 6:23:18 PM       Radiology Dg Chest 2 View  Result Date: 06/14/2016 CLINICAL DATA:  Pt c/o SOB starting this morning. Denies pain. Pt sts "I took a drink of water and got strangled." Pt was discharged from inpatient admission for PNA yesterday. Sts he was feeling better until he got choked this this morning. Hx of left lung CA. Last treatment around 4 weeks ago. Hx htn EXAM: CHEST  2 VIEW COMPARISON:  06/12/2016 FINDINGS: Extensive interstitial airspace opacities throughout the left lung, centered on the left mid lung, are without change from the recent prior exam, consistent with the patient's known carcinoma and lymphangitic spread. As noted previously, superimposed pneumonia is not excluded. Right lung is hyperexpanded but clear, also stable. Small left pleural effusion, stable.  No right pleural effusion. No pneumothorax. Cardiac silhouette is normal in size. IMPRESSION: 1. No change from the recent prior study. 2. Extensive left lung airspace and interstitial opacities consistent with the known carcinoma and lymphangitic spread are stable. No new lung abnormalities. Stable left pleural effusion. Electronically Signed   By: Lajean Manes M.D.   On: 06/14/2016 09:43    Procedures Procedures (including critical care time)  Medications Ordered in ED Medications - No data to display   Initial Impression / Assessment and Plan / ED Course  I have reviewed the triage vital signs and the nursing notes.  Pertinent labs & imaging results that were available during my care of the patient were reviewed by me and considered in my medical decision making (see chart for details).     82 y.o. male presents with  choking on water this morning with a cough and then becoming short of breath following. He is on his home O2 currently without desaturation. Has history of lung cancer and was recently admitted and discharged yesterday. Slept through the night but symptoms started this morning when his swallow was weak. He is currently swallowing liquids without choking and appears relatively well. Vitals appear  unchanged from discharge vitals yesterday with persistent tachycardia continuing treatment for post-obstructive pneumonia and left lung airspace disease. Suspect his swallow was weak this morning d/t deconditioning and his cough resulted in shortness of breath with lack of reserve due to underlying illness. No indication for readmission currently, family is in agreement with plan to return home and monitor symptoms further. Discussed palliative care as an option for increasing resources at home for symptom control. Plan to follow up with PCP as needed and return precautions discussed for worsening or new concerning symptoms.   Final Clinical Impressions(s) / ED Diagnoses   Final diagnoses:  Physical deconditioning  Choking episode occurring during daytime    New Prescriptions New Prescriptions   No medications on file     Leo Grosser, MD 06/14/16 1829

## 2016-06-14 NOTE — ED Triage Notes (Addendum)
Pt c/o SOB starting this morning.  Denies pain.  Pt sts "I took a drink of water and got strangled."  Pt was discharged from inpatient admission for PNA yesterday.  Sts he was feeling better until he got choked this this morning.  Hx of lung CA.  Last treatment around 4 weeks ago.  Family reports the Hospitalist was considering a swallow eval, but never ordered it.  Also, sts the Pt has been sleeping "sitting up while in the hospital and slept laying flat, last night."

## 2016-06-14 NOTE — ED Notes (Signed)
He continues to be in no distress; with his wife being at bedside.

## 2016-06-15 DIAGNOSIS — C7970 Secondary malignant neoplasm of unspecified adrenal gland: Secondary | ICD-10-CM | POA: Diagnosis not present

## 2016-06-15 DIAGNOSIS — D649 Anemia, unspecified: Secondary | ICD-10-CM | POA: Diagnosis not present

## 2016-06-15 DIAGNOSIS — C3490 Malignant neoplasm of unspecified part of unspecified bronchus or lung: Secondary | ICD-10-CM | POA: Diagnosis not present

## 2016-06-15 DIAGNOSIS — N183 Chronic kidney disease, stage 3 (moderate): Secondary | ICD-10-CM | POA: Diagnosis not present

## 2016-06-15 DIAGNOSIS — Z87891 Personal history of nicotine dependence: Secondary | ICD-10-CM | POA: Diagnosis not present

## 2016-06-15 DIAGNOSIS — I129 Hypertensive chronic kidney disease with stage 1 through stage 4 chronic kidney disease, or unspecified chronic kidney disease: Secondary | ICD-10-CM | POA: Diagnosis not present

## 2016-06-15 DIAGNOSIS — C7951 Secondary malignant neoplasm of bone: Secondary | ICD-10-CM | POA: Diagnosis not present

## 2016-06-15 DIAGNOSIS — Z86711 Personal history of pulmonary embolism: Secondary | ICD-10-CM | POA: Diagnosis not present

## 2016-06-15 DIAGNOSIS — J910 Malignant pleural effusion: Secondary | ICD-10-CM | POA: Diagnosis not present

## 2016-06-15 DIAGNOSIS — Z7901 Long term (current) use of anticoagulants: Secondary | ICD-10-CM | POA: Diagnosis not present

## 2016-06-15 DIAGNOSIS — J189 Pneumonia, unspecified organism: Secondary | ICD-10-CM | POA: Diagnosis not present

## 2016-06-16 ENCOUNTER — Ambulatory Visit (HOSPITAL_BASED_OUTPATIENT_CLINIC_OR_DEPARTMENT_OTHER): Payer: Medicare Other

## 2016-06-16 ENCOUNTER — Ambulatory Visit: Payer: Medicare Other | Admitting: Radiation Oncology

## 2016-06-16 ENCOUNTER — Other Ambulatory Visit (HOSPITAL_BASED_OUTPATIENT_CLINIC_OR_DEPARTMENT_OTHER): Payer: Medicare Other

## 2016-06-16 ENCOUNTER — Other Ambulatory Visit: Payer: Self-pay | Admitting: *Deleted

## 2016-06-16 VITALS — BP 113/69 | HR 110 | Temp 97.7°F | Resp 20

## 2016-06-16 DIAGNOSIS — C7970 Secondary malignant neoplasm of unspecified adrenal gland: Secondary | ICD-10-CM | POA: Diagnosis not present

## 2016-06-16 DIAGNOSIS — Z5112 Encounter for antineoplastic immunotherapy: Secondary | ICD-10-CM

## 2016-06-16 DIAGNOSIS — C3492 Malignant neoplasm of unspecified part of left bronchus or lung: Secondary | ICD-10-CM

## 2016-06-16 DIAGNOSIS — C7951 Secondary malignant neoplasm of bone: Secondary | ICD-10-CM | POA: Diagnosis not present

## 2016-06-16 DIAGNOSIS — Z510 Encounter for antineoplastic radiation therapy: Secondary | ICD-10-CM | POA: Diagnosis not present

## 2016-06-16 DIAGNOSIS — C7931 Secondary malignant neoplasm of brain: Secondary | ICD-10-CM

## 2016-06-16 DIAGNOSIS — Z7189 Other specified counseling: Secondary | ICD-10-CM

## 2016-06-16 LAB — CBC & DIFF AND RETIC
BASO%: 0.2 % (ref 0.0–2.0)
Basophils Absolute: 0 10*3/uL (ref 0.0–0.1)
EOS ABS: 0.1 10*3/uL (ref 0.0–0.5)
EOS%: 0.4 % (ref 0.0–7.0)
HCT: 27.9 % — ABNORMAL LOW (ref 38.4–49.9)
HGB: 8.8 g/dL — ABNORMAL LOW (ref 13.0–17.1)
IMMATURE RETIC FRACT: 24.4 % — AB (ref 3.00–10.60)
LYMPH#: 1.7 10*3/uL (ref 0.9–3.3)
LYMPH%: 9.9 % — AB (ref 14.0–49.0)
MCH: 29.6 pg (ref 27.2–33.4)
MCHC: 31.5 g/dL — ABNORMAL LOW (ref 32.0–36.0)
MCV: 93.9 fL (ref 79.3–98.0)
MONO#: 1.8 10*3/uL — AB (ref 0.1–0.9)
MONO%: 11 % (ref 0.0–14.0)
NEUT%: 78.5 % — ABNORMAL HIGH (ref 39.0–75.0)
NEUTROS ABS: 13.2 10*3/uL — AB (ref 1.5–6.5)
Platelets: 225 10*3/uL (ref 140–400)
RBC: 2.97 10*6/uL — ABNORMAL LOW (ref 4.20–5.82)
RDW: 18.9 % — ABNORMAL HIGH (ref 11.0–14.6)
RETIC %: 2.43 % — AB (ref 0.80–1.80)
RETIC CT ABS: 72.17 10*3/uL (ref 34.80–93.90)
WBC: 16.8 10*3/uL — ABNORMAL HIGH (ref 4.0–10.3)
nRBC: 0 % (ref 0–0)

## 2016-06-16 LAB — COMPREHENSIVE METABOLIC PANEL
ALT: 55 U/L (ref 0–55)
AST: 64 U/L — AB (ref 5–34)
Albumin: <2 g/dL — ABNORMAL LOW (ref 3.5–5.0)
Alkaline Phosphatase: 71 U/L (ref 40–150)
Anion Gap: 11 mEq/L (ref 3–11)
BILIRUBIN TOTAL: 0.25 mg/dL (ref 0.20–1.20)
BUN: 30.2 mg/dL — AB (ref 7.0–26.0)
CHLORIDE: 103 meq/L (ref 98–109)
CO2: 21 meq/L — AB (ref 22–29)
CREATININE: 1.3 mg/dL (ref 0.7–1.3)
Calcium: 8.4 mg/dL (ref 8.4–10.4)
EGFR: 54 mL/min/{1.73_m2} — ABNORMAL LOW (ref >90)
GLUCOSE: 110 mg/dL (ref 70–140)
Potassium: 4.7 mEq/L (ref 3.5–5.1)
SODIUM: 135 meq/L — AB (ref 136–145)
TOTAL PROTEIN: 6.2 g/dL — AB (ref 6.4–8.3)

## 2016-06-16 MED ORDER — SODIUM CHLORIDE 0.9 % IV SOLN
1200.0000 mg | Freq: Once | INTRAVENOUS | Status: AC
  Administered 2016-06-16: 1200 mg via INTRAVENOUS
  Filled 2016-06-16: qty 20

## 2016-06-16 MED ORDER — DENOSUMAB 120 MG/1.7ML ~~LOC~~ SOLN
120.0000 mg | Freq: Once | SUBCUTANEOUS | Status: DC
  Filled 2016-06-16: qty 1.7

## 2016-06-16 MED ORDER — SODIUM CHLORIDE 0.9 % IV SOLN
Freq: Once | INTRAVENOUS | Status: AC
  Administered 2016-06-16: 15:00:00 via INTRAVENOUS

## 2016-06-16 MED ORDER — LORAZEPAM 0.5 MG PO TABS: 0.5000 mg | ORAL_TABLET | Freq: Three times a day (TID) | ORAL | 0 refills | Status: DC | PRN

## 2016-06-16 MED ORDER — OXYCODONE HCL ER 10 MG PO T12A: 10.0000 mg | EXTENDED_RELEASE_TABLET | Freq: Two times a day (BID) | ORAL | 0 refills | Status: AC | PRN

## 2016-06-16 NOTE — Telephone Encounter (Signed)
sw pt wife regarding refill request for oxycontin.  Per chart, pt was given rx on 1/26 for oxycontin from hospitalist on discharge.  Pt wife stated they did get the RX but were told in the pharmacy "they could not refill it due to insurance."  Explained to pt wife that our office not be informed by insurance company if additional information was needed in order to fill rx since it was a prescription filled in the hospital.  Per Dr. Irene Limbo, ok to rewrite rx so that insurance company would fax [facility] for additional requested information.  Instructed to wife that original written rx needed to be brought to [place] for disposal.  Rx will be ready for pt pick up today during infusion.  Pt wife verbalized understanding.

## 2016-06-16 NOTE — Patient Instructions (Addendum)
[facility] Discharge Instructions for Patients Receiving Chemotherapy  Today you received the following chemotherapy agents Tecentriq.  To help prevent nausea and vomiting after your treatment, we encourage you to take your nausea medication as directed by your MD.   If you develop nausea and vomiting that is not controlled by your nausea medication, call the clinic.   BELOW ARE SYMPTOMS THAT SHOULD BE REPORTED IMMEDIATELY:  *FEVER GREATER THAN 100.5 F  *CHILLS WITH OR WITHOUT FEVER  NAUSEA AND VOMITING THAT IS NOT CONTROLLED WITH YOUR NAUSEA MEDICATION  *UNUSUAL SHORTNESS OF BREATH  *UNUSUAL BRUISING OR BLEEDING  TENDERNESS IN MOUTH AND THROAT WITH OR WITHOUT PRESENCE OF ULCERS  *URINARY PROBLEMS  *BOWEL PROBLEMS  UNUSUAL RASH Items with * indicate a potential emergency and should be followed up as soon as possible.  Feel free to call the clinic you have any questions or concerns. The clinic phone number is [phone].  Please show the [facility] at check-in to the Emergency Department and triage nurse.    Atezolizumab injection What is this medicine? ATEZOLIZUMAB (a te zoe LIZ ue mab) is a monoclonal antibody. It is used to treat bladder cancer (urothelial cancer) and non-small cell lung cancer. COMMON BRAND NAME(S): Tecentriq What should I tell my health care provider before I take this medicine? They need to know if you have any of these conditions: -diabetes -immune system problems -infection -inflammatory bowel disease -liver disease -lung or breathing disease -lupus -nervous system problems like myasthenia gravis or Guillain-Barre syndrome -organ transplant -an unusual or allergic reaction to atezolizumab, other medicines, foods, dyes, or preservatives -pregnant or trying to get pregnant -breast-feeding How should I use this medicine? This medicine is for infusion into a vein. It is given by a health care professional in a  hospital or clinic setting. A special MedGuide will be given to you before each treatment. Be sure to read this information carefully each time. Talk to your pediatrician regarding the use of this medicine in children. Special care may be needed. What if I miss a dose? It is important not to miss your dose. Call your doctor or health care professional if you are unable to keep an appointment. What may interact with this medicine? Interactions have not been studied. What should I watch for while using this medicine? Your condition will be monitored carefully while you are receiving this medicine. You may need blood work done while you are taking this medicine. Do not become pregnant while taking this medicine or for at least 5 months after stopping it. Women should inform their doctor if they wish to become pregnant or think they might be pregnant. There is a potential for serious side effects to an unborn child. Talk to your health care professional or pharmacist for more information. Do not breast-feed an infant while taking this medicine or for at least 5 months after the last dose. What side effects may I notice from receiving this medicine? Side effects that you should report to your doctor or health care professional as soon as possible: -allergic reactions like skin rash, itching or hives, swelling of the face, lips, or tongue -black, tarry stools -bloody or watery diarrhea -breathing problems -changes in vision -chest pain or chest tightness -chills -facial flushing -fever -headache -signs and symptoms of high blood sugar such as dizziness; dry mouth; dry skin; fruity breath; nausea; stomach pain; increased hunger or thirst; increased urination -signs and symptoms of liver injury like dark yellow or  brown urine; general ill feeling or flu-like symptoms; light-colored stools; loss of appetite; nausea; right upper belly pain; unusually weak or tired; yellowing of the eyes or  skin -stomach pain -trouble passing urine or change in the amount of urine Side effects that usually do not require medical attention (report to your doctor or health care professional if they continue or are bothersome): -cough -diarrhea -joint pain -muscle pain -muscle weakness -tiredness -weight loss Where should I keep my medicine? This drug is given in a hospital or clinic and will not be stored at home.  2017 Elsevier/Gold Standard (2015-06-06 17:54:14)

## 2016-06-17 ENCOUNTER — Ambulatory Visit
Admission: RE | Admit: 2016-06-17 | Discharge: 2016-06-17 | Disposition: A | Discharge status: Home / Self Care | Payer: Medicare Other | Source: Ambulatory Visit | Attending: Radiation Oncology | Admitting: Radiation Oncology

## 2016-06-17 ENCOUNTER — Telehealth: Payer: Self-pay

## 2016-06-17 ENCOUNTER — Ambulatory Visit: Payer: Medicare Other | Admitting: Hematology

## 2016-06-17 ENCOUNTER — Other Ambulatory Visit: Payer: Medicare Other

## 2016-06-17 ENCOUNTER — Encounter: Payer: Self-pay | Admitting: Radiation Oncology

## 2016-06-17 ENCOUNTER — Encounter: Payer: Self-pay | Admitting: *Deleted

## 2016-06-17 ENCOUNTER — Ambulatory Visit: Payer: Medicare Other

## 2016-06-17 ENCOUNTER — Encounter: Payer: Self-pay | Admitting: Hematology

## 2016-06-17 VITALS — BP 122/67 | HR 112 | Temp 98.1°F | Ht 73.0 in | Wt 185.0 lb

## 2016-06-17 DIAGNOSIS — Z510 Encounter for antineoplastic radiation therapy: Secondary | ICD-10-CM | POA: Diagnosis not present

## 2016-06-17 DIAGNOSIS — C3492 Malignant neoplasm of unspecified part of left bronchus or lung: Secondary | ICD-10-CM | POA: Insufficient documentation

## 2016-06-17 DIAGNOSIS — I129 Hypertensive chronic kidney disease with stage 1 through stage 4 chronic kidney disease, or unspecified chronic kidney disease: Secondary | ICD-10-CM | POA: Diagnosis not present

## 2016-06-17 DIAGNOSIS — J910 Malignant pleural effusion: Secondary | ICD-10-CM | POA: Diagnosis not present

## 2016-06-17 DIAGNOSIS — C7951 Secondary malignant neoplasm of bone: Secondary | ICD-10-CM | POA: Diagnosis not present

## 2016-06-17 DIAGNOSIS — C3490 Malignant neoplasm of unspecified part of unspecified bronchus or lung: Secondary | ICD-10-CM | POA: Diagnosis not present

## 2016-06-17 DIAGNOSIS — C7970 Secondary malignant neoplasm of unspecified adrenal gland: Secondary | ICD-10-CM | POA: Diagnosis not present

## 2016-06-17 DIAGNOSIS — J189 Pneumonia, unspecified organism: Secondary | ICD-10-CM | POA: Diagnosis not present

## 2016-06-17 MED ORDER — BIAFINE EX EMUL
Freq: Once | CUTANEOUS | Status: AC
  Administered 2016-06-17: 17:00:00 via TOPICAL

## 2016-06-17 NOTE — Progress Notes (Signed)
Requested auth for Oxycontin today.

## 2016-06-17 NOTE — Telephone Encounter (Signed)
LMTCB

## 2016-06-17 NOTE — Progress Notes (Signed)
  Radiation Oncology         [phone]) [phone] ________________________________  Name: Tim Ross MRN: [id number]  Date: 06/17/2016  DOB: 04/20/1935  Weekly Radiation Therapy Management    ICD-9-CM ICD-10-CM   1. Adenocarcinoma of left lung, stage 4 (HCC) 162.9 C34.92      Current Dose: 3 Gy     Planned Dose:  30 Gy  Narrative . . . . . . . . The patient presents for routine under treatment assessment.    The patient has completed 1 fraction to his Chest and T Spine. He denies pain. He reports feeling that something is stuck in his throat. He denies a cough. He reports he ambulates with a walker at home. Nursing notes the patient's oxygen saturation was low at 89% on arrival today.                                    The patient is without complaint.                                 Set-up films were reviewed.                                 The chart was checked. Physical Findings. . .  height is '6\' 1"'$  (1.854 m) and weight is 185 lb (83.9 kg). His oral temperature is 98.1 F (36.7 C). His blood pressure is 122/67 and his pulse is 112 (abnormal). His oxygen saturation is 89% (abnormal).  Weight essentially stable.  No significant changes. Cardiopulmonary assessment is negative for acute distress and he exhibits normal effort. Mildly decreased breath sounds in the left lung. Heart is regular in rate and rhythm. The patient is in a wheelchair today. He is using 3 L of oxygen via nasal cannula.  O2 sats improved with the patient resting (95%) Impression . . . . . . . The patient is tolerating radiation. Plan . . . . . . . . . . . . Continue final treatment as planned.  ________________________________   Blair Promise, PhD, MD  This document serves as a record of services personally performed by Gery Pray, MD. It was created on his behalf by Maryla Morrow, a trained medical scribe. The creation of this record is based on the scribe's personal observations and the provider's  statements to them. This document has been checked and approved by the attending provider.

## 2016-06-17 NOTE — Progress Notes (Signed)
Tim Ross has completed 1 fraction to his chest and T spine.   He denies having pain.  He is currently using 3 L of oxygen.  His oxygen saturation was low at 89% on arrival.  It was 95% when rechecked.  He is in a wheelchair today and uses a walker at home.  He reports feeling like something is stuck in his throat.  He denies having a cough.  BP 122/67   Pulse (!) 112   Temp 98.1 F (36.7 C) (Oral)   Ht '6\' 1"'$  (1.854 m)   Wt 185 lb (83.9 kg)   SpO2 (!) 89% Comment: 3L  BMI 24.41 kg/m    Wt Readings from Last 3 Encounters:  06/17/16 185 lb (83.9 kg)  06/14/16 180 lb (81.6 kg)  06/08/16 181 lb (82.1 kg)

## 2016-06-17 NOTE — Progress Notes (Signed)
Fax for oxycontin PA placed in PA box located in managed care 06/17/16.

## 2016-06-17 NOTE — Progress Notes (Signed)
Pt here for patient teaching.  Pt given Radiation and You booklet and biafine cream.  Reviewed areas of pertinence such as fatigue, skin changes and throat changes . Pt able to give teach back of to pat skin and use unscented/gentle soap,avoid applying anything to skin within 4 hours of treatment and to apply biafine BID. Pt demonstrated understanding and verbalizes understanding of information given and will contact nursing with any questions or concerns.

## 2016-06-17 NOTE — Progress Notes (Signed)
  Radiation Oncology         [phone]) [phone] ________________________________  Name: Tim Ross MRN: [id number]  Date: 06/17/2016  DOB: May 15, 1935  Simulation Verification Note    ICD-9-CM ICD-10-CM   1. Adenocarcinoma of left lung, stage 4 (HCC) 162.9 C34.92     Status: outpatient  NARRATIVE: The patient was brought to the treatment unit and placed in the planned treatment position. The clinical setup was verified. Then port films were obtained and uploaded to the radiation oncology medical record software.  The treatment beams were carefully compared against the planned radiation fields. The position location and shape of the radiation fields was reviewed. They targeted volume of tissue appears to be appropriately covered by the radiation beams. Organs at risk appear to be excluded as planned.  Based on my personal review, I approved the simulation verification. The patient's treatment will proceed as planned.  -----------------------------------  Blair Promise, PhD, MD

## 2016-06-18 ENCOUNTER — Ambulatory Visit: Payer: Medicare Other | Admitting: Radiation Oncology

## 2016-06-18 ENCOUNTER — Ambulatory Visit
Admission: RE | Admit: 2016-06-18 | Discharge: 2016-06-18 | Disposition: A | Discharge status: Home / Self Care | Payer: Medicare Other | Source: Ambulatory Visit | Attending: Radiation Oncology | Admitting: Radiation Oncology

## 2016-06-18 DIAGNOSIS — C3490 Malignant neoplasm of unspecified part of unspecified bronchus or lung: Secondary | ICD-10-CM | POA: Diagnosis not present

## 2016-06-18 DIAGNOSIS — J189 Pneumonia, unspecified organism: Secondary | ICD-10-CM | POA: Diagnosis not present

## 2016-06-18 DIAGNOSIS — C7951 Secondary malignant neoplasm of bone: Secondary | ICD-10-CM | POA: Diagnosis not present

## 2016-06-18 DIAGNOSIS — I129 Hypertensive chronic kidney disease with stage 1 through stage 4 chronic kidney disease, or unspecified chronic kidney disease: Secondary | ICD-10-CM | POA: Diagnosis not present

## 2016-06-18 DIAGNOSIS — C7970 Secondary malignant neoplasm of unspecified adrenal gland: Secondary | ICD-10-CM | POA: Diagnosis not present

## 2016-06-18 DIAGNOSIS — J910 Malignant pleural effusion: Secondary | ICD-10-CM | POA: Diagnosis not present

## 2016-06-19 ENCOUNTER — Ambulatory Visit
Admission: RE | Admit: 2016-06-19 | Discharge: 2016-06-19 | Disposition: A | Discharge status: Home / Self Care | Payer: Medicare Other | Source: Ambulatory Visit | Attending: Radiation Oncology | Admitting: Radiation Oncology

## 2016-06-19 DIAGNOSIS — J910 Malignant pleural effusion: Secondary | ICD-10-CM | POA: Diagnosis not present

## 2016-06-19 DIAGNOSIS — C7970 Secondary malignant neoplasm of unspecified adrenal gland: Secondary | ICD-10-CM | POA: Diagnosis not present

## 2016-06-19 DIAGNOSIS — J189 Pneumonia, unspecified organism: Secondary | ICD-10-CM | POA: Diagnosis not present

## 2016-06-19 DIAGNOSIS — C3490 Malignant neoplasm of unspecified part of unspecified bronchus or lung: Secondary | ICD-10-CM | POA: Diagnosis not present

## 2016-06-19 DIAGNOSIS — I129 Hypertensive chronic kidney disease with stage 1 through stage 4 chronic kidney disease, or unspecified chronic kidney disease: Secondary | ICD-10-CM | POA: Diagnosis not present

## 2016-06-19 DIAGNOSIS — C7951 Secondary malignant neoplasm of bone: Secondary | ICD-10-CM | POA: Diagnosis not present

## 2016-06-20 ENCOUNTER — Encounter: Payer: Self-pay | Admitting: Hematology

## 2016-06-20 ENCOUNTER — Ambulatory Visit: Payer: Medicare Other

## 2016-06-20 ENCOUNTER — Ambulatory Visit (HOSPITAL_BASED_OUTPATIENT_CLINIC_OR_DEPARTMENT_OTHER): Payer: Medicare Other | Admitting: Hematology

## 2016-06-20 ENCOUNTER — Encounter: Payer: Self-pay | Admitting: Radiation Oncology

## 2016-06-20 ENCOUNTER — Ambulatory Visit (HOSPITAL_COMMUNITY)
Admission: RE | Admit: 2016-06-20 | Discharge: 2016-06-20 | Disposition: A | Discharge status: Home / Self Care | Payer: Medicare Other | Source: Ambulatory Visit | Attending: Hematology | Admitting: Hematology

## 2016-06-20 ENCOUNTER — Ambulatory Visit
Admission: RE | Admit: 2016-06-20 | Discharge: 2016-06-20 | Disposition: A | Discharge status: Home / Self Care | Payer: Medicare Other | Source: Ambulatory Visit | Attending: Radiation Oncology | Admitting: Radiation Oncology

## 2016-06-20 ENCOUNTER — Other Ambulatory Visit (HOSPITAL_BASED_OUTPATIENT_CLINIC_OR_DEPARTMENT_OTHER): Payer: Medicare Other

## 2016-06-20 VITALS — BP 114/54 | HR 124 | Temp 98.0°F | Resp 18 | Ht 73.0 in | Wt 186.5 lb

## 2016-06-20 DIAGNOSIS — I2699 Other pulmonary embolism without acute cor pulmonale: Secondary | ICD-10-CM | POA: Diagnosis not present

## 2016-06-20 DIAGNOSIS — C3492 Malignant neoplasm of unspecified part of left bronchus or lung: Secondary | ICD-10-CM

## 2016-06-20 DIAGNOSIS — E46 Unspecified protein-calorie malnutrition: Secondary | ICD-10-CM

## 2016-06-20 DIAGNOSIS — C7951 Secondary malignant neoplasm of bone: Secondary | ICD-10-CM | POA: Diagnosis not present

## 2016-06-20 DIAGNOSIS — R5383 Other fatigue: Secondary | ICD-10-CM | POA: Diagnosis not present

## 2016-06-20 DIAGNOSIS — F329 Major depressive disorder, single episode, unspecified: Secondary | ICD-10-CM

## 2016-06-20 DIAGNOSIS — J910 Malignant pleural effusion: Secondary | ICD-10-CM | POA: Diagnosis not present

## 2016-06-20 DIAGNOSIS — C7970 Secondary malignant neoplasm of unspecified adrenal gland: Secondary | ICD-10-CM | POA: Diagnosis not present

## 2016-06-20 DIAGNOSIS — I129 Hypertensive chronic kidney disease with stage 1 through stage 4 chronic kidney disease, or unspecified chronic kidney disease: Secondary | ICD-10-CM | POA: Diagnosis not present

## 2016-06-20 DIAGNOSIS — D649 Anemia, unspecified: Secondary | ICD-10-CM

## 2016-06-20 DIAGNOSIS — J189 Pneumonia, unspecified organism: Secondary | ICD-10-CM | POA: Diagnosis not present

## 2016-06-20 DIAGNOSIS — C3490 Malignant neoplasm of unspecified part of unspecified bronchus or lung: Secondary | ICD-10-CM | POA: Diagnosis not present

## 2016-06-20 DIAGNOSIS — R52 Pain, unspecified: Secondary | ICD-10-CM

## 2016-06-20 LAB — CBC WITH DIFFERENTIAL/PLATELET
BASO%: 0.3 % (ref 0.0–2.0)
BASOS ABS: 0 10*3/uL (ref 0.0–0.1)
EOS ABS: 0.1 10*3/uL (ref 0.0–0.5)
EOS%: 0.6 % (ref 0.0–7.0)
HCT: 26.9 % — ABNORMAL LOW (ref 38.4–49.9)
HEMOGLOBIN: 8.7 g/dL — AB (ref 13.0–17.1)
LYMPH#: 1.1 10*3/uL (ref 0.9–3.3)
LYMPH%: 7.8 % — ABNORMAL LOW (ref 14.0–49.0)
MCH: 30.3 pg (ref 27.2–33.4)
MCHC: 32.2 g/dL (ref 32.0–36.0)
MCV: 94 fL (ref 79.3–98.0)
MONO#: 1.1 10*3/uL — AB (ref 0.1–0.9)
MONO%: 8.4 % (ref 0.0–14.0)
NEUT#: 11.3 10*3/uL — ABNORMAL HIGH (ref 1.5–6.5)
NEUT%: 82.9 % — ABNORMAL HIGH (ref 39.0–75.0)
Platelets: 292 10*3/uL (ref 140–400)
RBC: 2.87 10*6/uL — ABNORMAL LOW (ref 4.20–5.82)
RDW: 19.6 % — AB (ref 11.0–14.6)
WBC: 13.6 10*3/uL — ABNORMAL HIGH (ref 4.0–10.3)

## 2016-06-20 LAB — COMPREHENSIVE METABOLIC PANEL
ALK PHOS: 69 U/L (ref 40–150)
ALT: 44 U/L (ref 0–55)
AST: 65 U/L — ABNORMAL HIGH (ref 5–34)
Anion Gap: 8 mEq/L (ref 3–11)
BILIRUBIN TOTAL: 0.35 mg/dL (ref 0.20–1.20)
BUN: 35.9 mg/dL — AB (ref 7.0–26.0)
CALCIUM: 8.4 mg/dL (ref 8.4–10.4)
CO2: 24 mEq/L (ref 22–29)
Chloride: 106 mEq/L (ref 98–109)
Creatinine: 1.2 mg/dL (ref 0.7–1.3)
EGFR: 55 mL/min/{1.73_m2} — AB (ref >90)
Glucose: 120 mg/dl (ref 70–140)
POTASSIUM: 4.9 meq/L (ref 3.5–5.1)
Sodium: 138 mEq/L (ref 136–145)
TOTAL PROTEIN: 6 g/dL — AB (ref 6.4–8.3)

## 2016-06-20 LAB — PREPARE RBC (CROSSMATCH)

## 2016-06-21 ENCOUNTER — Other Ambulatory Visit: Payer: Self-pay

## 2016-06-21 ENCOUNTER — Inpatient Hospital Stay (HOSPITAL_COMMUNITY): Payer: Medicare Other

## 2016-06-21 ENCOUNTER — Inpatient Hospital Stay (HOSPITAL_COMMUNITY)
Admission: EM | Admit: 2016-06-21 | Discharge: 2016-06-24 | DRG: 193 | Disposition: A | Discharge status: Home / Self Care | Payer: Medicare Other | Attending: Internal Medicine | Admitting: Internal Medicine

## 2016-06-21 ENCOUNTER — Emergency Department (HOSPITAL_COMMUNITY): Payer: Medicare Other

## 2016-06-21 ENCOUNTER — Encounter (HOSPITAL_COMMUNITY): Payer: Self-pay | Admitting: Emergency Medicine

## 2016-06-21 ENCOUNTER — Other Ambulatory Visit (HOSPITAL_COMMUNITY): Payer: Self-pay

## 2016-06-21 DIAGNOSIS — I10 Essential (primary) hypertension: Secondary | ICD-10-CM | POA: Diagnosis present

## 2016-06-21 DIAGNOSIS — R0600 Dyspnea, unspecified: Secondary | ICD-10-CM | POA: Diagnosis not present

## 2016-06-21 DIAGNOSIS — Z79891 Long term (current) use of opiate analgesic: Secondary | ICD-10-CM

## 2016-06-21 DIAGNOSIS — J9601 Acute respiratory failure with hypoxia: Secondary | ICD-10-CM

## 2016-06-21 DIAGNOSIS — Z87891 Personal history of nicotine dependence: Secondary | ICD-10-CM | POA: Diagnosis not present

## 2016-06-21 DIAGNOSIS — N183 Chronic kidney disease, stage 3 (moderate): Secondary | ICD-10-CM | POA: Diagnosis present

## 2016-06-21 DIAGNOSIS — R531 Weakness: Secondary | ICD-10-CM | POA: Diagnosis not present

## 2016-06-21 DIAGNOSIS — Z86711 Personal history of pulmonary embolism: Secondary | ICD-10-CM

## 2016-06-21 DIAGNOSIS — R630 Anorexia: Secondary | ICD-10-CM | POA: Diagnosis present

## 2016-06-21 DIAGNOSIS — J9621 Acute and chronic respiratory failure with hypoxia: Secondary | ICD-10-CM | POA: Diagnosis present

## 2016-06-21 DIAGNOSIS — R05 Cough: Secondary | ICD-10-CM | POA: Diagnosis not present

## 2016-06-21 DIAGNOSIS — I898 Other specified noninfective disorders of lymphatic vessels and lymph nodes: Secondary | ICD-10-CM | POA: Diagnosis present

## 2016-06-21 DIAGNOSIS — F419 Anxiety disorder, unspecified: Secondary | ICD-10-CM | POA: Diagnosis present

## 2016-06-21 DIAGNOSIS — G893 Neoplasm related pain (acute) (chronic): Secondary | ICD-10-CM | POA: Diagnosis present

## 2016-06-21 DIAGNOSIS — E46 Unspecified protein-calorie malnutrition: Secondary | ICD-10-CM | POA: Diagnosis present

## 2016-06-21 DIAGNOSIS — R0602 Shortness of breath: Secondary | ICD-10-CM | POA: Diagnosis not present

## 2016-06-21 DIAGNOSIS — Y95 Nosocomial condition: Secondary | ICD-10-CM | POA: Diagnosis present

## 2016-06-21 DIAGNOSIS — I248 Other forms of acute ischemic heart disease: Secondary | ICD-10-CM | POA: Diagnosis present

## 2016-06-21 DIAGNOSIS — C3412 Malignant neoplasm of upper lobe, left bronchus or lung: Secondary | ICD-10-CM | POA: Diagnosis not present

## 2016-06-21 DIAGNOSIS — F329 Major depressive disorder, single episode, unspecified: Secondary | ICD-10-CM | POA: Diagnosis present

## 2016-06-21 DIAGNOSIS — Z7901 Long term (current) use of anticoagulants: Secondary | ICD-10-CM

## 2016-06-21 DIAGNOSIS — E785 Hyperlipidemia, unspecified: Secondary | ICD-10-CM | POA: Diagnosis present

## 2016-06-21 DIAGNOSIS — D6869 Other thrombophilia: Secondary | ICD-10-CM | POA: Diagnosis not present

## 2016-06-21 DIAGNOSIS — J189 Pneumonia, unspecified organism: Principal | ICD-10-CM | POA: Diagnosis present

## 2016-06-21 DIAGNOSIS — K219 Gastro-esophageal reflux disease without esophagitis: Secondary | ICD-10-CM | POA: Diagnosis present

## 2016-06-21 DIAGNOSIS — J910 Malignant pleural effusion: Secondary | ICD-10-CM | POA: Diagnosis not present

## 2016-06-21 DIAGNOSIS — Z6825 Body mass index (BMI) 25.0-25.9, adult: Secondary | ICD-10-CM

## 2016-06-21 DIAGNOSIS — Z66 Do not resuscitate: Secondary | ICD-10-CM | POA: Diagnosis not present

## 2016-06-21 DIAGNOSIS — C7951 Secondary malignant neoplasm of bone: Secondary | ICD-10-CM | POA: Diagnosis not present

## 2016-06-21 DIAGNOSIS — D649 Anemia, unspecified: Secondary | ICD-10-CM | POA: Diagnosis not present

## 2016-06-21 DIAGNOSIS — Z515 Encounter for palliative care: Secondary | ICD-10-CM | POA: Diagnosis not present

## 2016-06-21 DIAGNOSIS — C7970 Secondary malignant neoplasm of unspecified adrenal gland: Secondary | ICD-10-CM | POA: Diagnosis present

## 2016-06-21 DIAGNOSIS — Z8249 Family history of ischemic heart disease and other diseases of the circulatory system: Secondary | ICD-10-CM

## 2016-06-21 DIAGNOSIS — C3490 Malignant neoplasm of unspecified part of unspecified bronchus or lung: Secondary | ICD-10-CM

## 2016-06-21 DIAGNOSIS — C801 Malignant (primary) neoplasm, unspecified: Secondary | ICD-10-CM | POA: Diagnosis not present

## 2016-06-21 DIAGNOSIS — C3492 Malignant neoplasm of unspecified part of left bronchus or lung: Secondary | ICD-10-CM | POA: Diagnosis present

## 2016-06-21 DIAGNOSIS — D638 Anemia in other chronic diseases classified elsewhere: Secondary | ICD-10-CM | POA: Diagnosis present

## 2016-06-21 DIAGNOSIS — I82403 Acute embolism and thrombosis of unspecified deep veins of lower extremity, bilateral: Secondary | ICD-10-CM | POA: Diagnosis present

## 2016-06-21 DIAGNOSIS — I129 Hypertensive chronic kidney disease with stage 1 through stage 4 chronic kidney disease, or unspecified chronic kidney disease: Secondary | ICD-10-CM | POA: Diagnosis present

## 2016-06-21 DIAGNOSIS — I824Z3 Acute embolism and thrombosis of unspecified deep veins of distal lower extremity, bilateral: Secondary | ICD-10-CM | POA: Diagnosis present

## 2016-06-21 DIAGNOSIS — I2699 Other pulmonary embolism without acute cor pulmonale: Secondary | ICD-10-CM | POA: Diagnosis not present

## 2016-06-21 DIAGNOSIS — Z888 Allergy status to other drugs, medicaments and biological substances status: Secondary | ICD-10-CM

## 2016-06-21 DIAGNOSIS — Z79899 Other long term (current) drug therapy: Secondary | ICD-10-CM

## 2016-06-21 LAB — TYPE AND SCREEN
ABO/RH(D): A NEG
ANTIBODY SCREEN: NEGATIVE
UNIT DIVISION: 0
Unit division: 0

## 2016-06-21 LAB — CBC
HCT: 25.4 % — ABNORMAL LOW (ref 39.0–52.0)
HEMOGLOBIN: 8.1 g/dL — AB (ref 13.0–17.0)
MCH: 30.1 pg (ref 26.0–34.0)
MCHC: 31.9 g/dL (ref 30.0–36.0)
MCV: 94.4 fL (ref 78.0–100.0)
PLATELETS: 202 10*3/uL (ref 150–400)
RBC: 2.69 MIL/uL — ABNORMAL LOW (ref 4.22–5.81)
RDW: 18.9 % — ABNORMAL HIGH (ref 11.5–15.5)
WBC: 11.8 10*3/uL — ABNORMAL HIGH (ref 4.0–10.5)

## 2016-06-21 LAB — BLOOD GAS, ARTERIAL
Acid-base deficit: 1.1 mmol/L (ref 0.0–2.0)
BICARBONATE: 22.2 mmol/L (ref 20.0–28.0)
Drawn by: 295031
FIO2: 100
O2 Saturation: 85 %
PCO2 ART: 34.1 mmHg (ref 32.0–48.0)
PH ART: 7.43 (ref 7.350–7.450)
PO2 ART: 54.2 mmHg — AB (ref 83.0–108.0)
Patient temperature: 37

## 2016-06-21 LAB — BASIC METABOLIC PANEL
ANION GAP: 9 (ref 5–15)
BUN: 34 mg/dL — ABNORMAL HIGH (ref 6–20)
CO2: 22 mmol/L (ref 22–32)
CREATININE: 1.39 mg/dL — AB (ref 0.61–1.24)
Calcium: 7.5 mg/dL — ABNORMAL LOW (ref 8.9–10.3)
Chloride: 106 mmol/L (ref 101–111)
GFR calc non Af Amer: 47 mL/min — ABNORMAL LOW (ref >60)
GFR, EST AFRICAN AMERICAN: 54 mL/min — AB (ref >60)
GLUCOSE: 132 mg/dL — AB (ref 65–99)
Potassium: 4.6 mmol/L (ref 3.5–5.1)
Sodium: 137 mmol/L (ref 135–145)

## 2016-06-21 LAB — EXPECTORATED SPUTUM ASSESSMENT W REFEX TO RESP CULTURE

## 2016-06-21 LAB — I-STAT TROPONIN, ED: TROPONIN I, POC: 0.11 ng/mL — AB (ref 0.00–0.08)

## 2016-06-21 LAB — TROPONIN I
Troponin I: 0.08 ng/mL (ref <0.03)
Troponin I: 0.13 ng/mL (ref <0.03)

## 2016-06-21 LAB — PREPARE RBC (CROSSMATCH)

## 2016-06-21 LAB — INFLUENZA PANEL BY PCR (TYPE A & B)
INFLAPCR: NEGATIVE
Influenza B By PCR: NEGATIVE

## 2016-06-21 LAB — MRSA PCR SCREENING: MRSA BY PCR: NEGATIVE

## 2016-06-21 LAB — EXPECTORATED SPUTUM ASSESSMENT W GRAM STAIN, RFLX TO RESP C

## 2016-06-21 MED ORDER — SENNOSIDES-DOCUSATE SODIUM 8.6-50 MG PO TABS
2.0000 | ORAL_TABLET | Freq: Two times a day (BID) | ORAL | Status: DC
  Administered 2016-06-21 – 2016-06-22 (×3): 2 via ORAL
  Filled 2016-06-21 (×3): qty 2

## 2016-06-21 MED ORDER — ALBUTEROL SULFATE (2.5 MG/3ML) 0.083% IN NEBU: 5.0000 mg | INHALATION_SOLUTION | Freq: Once | RESPIRATORY_TRACT | Status: DC

## 2016-06-21 MED ORDER — POLYETHYLENE GLYCOL 3350 17 G PO PACK
17.0000 g | PACK | Freq: Every day | ORAL | Status: DC
  Administered 2016-06-22: 17 g via ORAL
  Filled 2016-06-21: qty 1

## 2016-06-21 MED ORDER — IPRATROPIUM BROMIDE 0.02 % IN SOLN: 0.5000 mg | Freq: Once | RESPIRATORY_TRACT | Status: DC

## 2016-06-21 MED ORDER — ENSURE ENLIVE PO LIQD
237.0000 mL | Freq: Two times a day (BID) | ORAL | Status: DC
  Administered 2016-06-22: 237 mL via ORAL

## 2016-06-21 MED ORDER — PROSIGHT PO TABS
1.0000 | ORAL_TABLET | Freq: Two times a day (BID) | ORAL | Status: DC
  Administered 2016-06-21 – 2016-06-22 (×3): 1 via ORAL
  Filled 2016-06-21 (×3): qty 1

## 2016-06-21 MED ORDER — FOLIC ACID 1 MG PO TABS
1.0000 mg | ORAL_TABLET | Freq: Every day | ORAL | Status: DC
  Administered 2016-06-22: 1 mg via ORAL
  Filled 2016-06-21: qty 1

## 2016-06-21 MED ORDER — PROCHLORPERAZINE MALEATE 10 MG PO TABS: 10.0000 mg | ORAL_TABLET | Freq: Four times a day (QID) | ORAL | Status: DC | PRN

## 2016-06-21 MED ORDER — OSELTAMIVIR PHOSPHATE 30 MG PO CAPS
30.0000 mg | ORAL_CAPSULE | Freq: Once | ORAL | Status: AC
  Administered 2016-06-21: 30 mg via ORAL
  Filled 2016-06-21: qty 1

## 2016-06-21 MED ORDER — SENNA 8.6 MG PO TABS: 1.0000 | ORAL_TABLET | Freq: Two times a day (BID) | ORAL | Status: DC

## 2016-06-21 MED ORDER — ONDANSETRON HCL 4 MG PO TABS: 8.0000 mg | ORAL_TABLET | Freq: Two times a day (BID) | ORAL | Status: DC | PRN

## 2016-06-21 MED ORDER — APIXABAN 5 MG PO TABS
5.0000 mg | ORAL_TABLET | Freq: Two times a day (BID) | ORAL | Status: DC
  Administered 2016-06-21 – 2016-06-22 (×3): 5 mg via ORAL
  Filled 2016-06-21 (×3): qty 1

## 2016-06-21 MED ORDER — IPRATROPIUM-ALBUTEROL 0.5-2.5 (3) MG/3ML IN SOLN
3.0000 mL | Freq: Once | RESPIRATORY_TRACT | Status: AC
  Administered 2016-06-21: 3 mL via RESPIRATORY_TRACT
  Filled 2016-06-21: qty 3

## 2016-06-21 MED ORDER — DEXTROSE 5 % IV SOLN
1.0000 g | Freq: Two times a day (BID) | INTRAVENOUS | Status: DC
  Administered 2016-06-21 – 2016-06-22 (×3): 1 g via INTRAVENOUS
  Filled 2016-06-21 (×5): qty 1

## 2016-06-21 MED ORDER — ONDANSETRON HCL 4 MG PO TABS: 4.0000 mg | ORAL_TABLET | Freq: Four times a day (QID) | ORAL | Status: DC | PRN

## 2016-06-21 MED ORDER — ORAL CARE MOUTH RINSE
15.0000 mL | Freq: Two times a day (BID) | OROMUCOSAL | Status: DC
  Administered 2016-06-22: 15 mL via OROMUCOSAL

## 2016-06-21 MED ORDER — SODIUM CHLORIDE 0.9 % IV SOLN
INTRAVENOUS | Status: DC
  Administered 2016-06-21 – 2016-06-23 (×2): via INTRAVENOUS

## 2016-06-21 MED ORDER — OXYCODONE HCL 5 MG PO TABS
5.0000 mg | ORAL_TABLET | ORAL | Status: DC | PRN
  Administered 2016-06-21 (×2): 5 mg via ORAL
  Administered 2016-06-22 (×2): 10 mg via ORAL
  Filled 2016-06-21: qty 1
  Filled 2016-06-21 (×2): qty 2
  Filled 2016-06-21: qty 1
  Filled 2016-06-21: qty 2

## 2016-06-21 MED ORDER — OXYCODONE HCL ER 10 MG PO T12A
10.0000 mg | EXTENDED_RELEASE_TABLET | Freq: Two times a day (BID) | ORAL | Status: DC
  Administered 2016-06-21 – 2016-06-22 (×3): 10 mg via ORAL
  Filled 2016-06-21 (×3): qty 1

## 2016-06-21 MED ORDER — VANCOMYCIN HCL IN DEXTROSE 1-5 GM/200ML-% IV SOLN
1000.0000 mg | Freq: Once | INTRAVENOUS | Status: AC
  Administered 2016-06-21: 1000 mg via INTRAVENOUS
  Filled 2016-06-21: qty 200

## 2016-06-21 MED ORDER — ACETAMINOPHEN 650 MG RE SUPP: 650.0000 mg | Freq: Four times a day (QID) | RECTAL | Status: DC | PRN

## 2016-06-21 MED ORDER — HYDROCOD POLST-CPM POLST ER 10-8 MG/5ML PO SUER
5.0000 mL | Freq: Two times a day (BID) | ORAL | Status: DC | PRN
  Administered 2016-06-21: 5 mL via ORAL
  Filled 2016-06-21: qty 5

## 2016-06-21 MED ORDER — BENZONATATE 100 MG PO CAPS
200.0000 mg | ORAL_CAPSULE | Freq: Three times a day (TID) | ORAL | Status: DC | PRN
  Administered 2016-06-21 – 2016-06-23 (×2): 200 mg via ORAL
  Filled 2016-06-21 (×2): qty 2

## 2016-06-21 MED ORDER — GUAIFENESIN ER 600 MG PO TB12
1200.0000 mg | ORAL_TABLET | Freq: Two times a day (BID) | ORAL | Status: DC
  Administered 2016-06-21 – 2016-06-22 (×3): 1200 mg via ORAL
  Filled 2016-06-21 (×3): qty 2

## 2016-06-21 MED ORDER — ICAPS PO CAPS: 1.0000 | ORAL_CAPSULE | Freq: Two times a day (BID) | ORAL | Status: DC

## 2016-06-21 MED ORDER — DEXTROSE 5 % IV SOLN
2.0000 g | Freq: Once | INTRAVENOUS | Status: AC
  Administered 2016-06-21: 2 g via INTRAVENOUS
  Filled 2016-06-21: qty 2

## 2016-06-21 MED ORDER — SODIUM CHLORIDE 0.9 % IV SOLN: Freq: Once | INTRAVENOUS | Status: AC

## 2016-06-21 MED ORDER — ACETAMINOPHEN 325 MG PO TABS: 650.0000 mg | ORAL_TABLET | Freq: Four times a day (QID) | ORAL | Status: DC | PRN

## 2016-06-21 MED ORDER — PANTOPRAZOLE SODIUM 40 MG PO TBEC
40.0000 mg | DELAYED_RELEASE_TABLET | Freq: Every day | ORAL | Status: DC
  Administered 2016-06-22: 40 mg via ORAL
  Filled 2016-06-21: qty 1

## 2016-06-21 MED ORDER — ONDANSETRON HCL 4 MG/2ML IJ SOLN
4.0000 mg | Freq: Four times a day (QID) | INTRAMUSCULAR | Status: DC | PRN
Start: 2016-06-21 — End: 2016-06-23

## 2016-06-21 MED ORDER — LORAZEPAM 0.5 MG PO TABS
0.5000 mg | ORAL_TABLET | Freq: Three times a day (TID) | ORAL | Status: DC | PRN
  Administered 2016-06-21 – 2016-06-22 (×3): 0.5 mg via ORAL
  Filled 2016-06-21 (×3): qty 1

## 2016-06-21 MED ORDER — MIRTAZAPINE 15 MG PO TABS
15.0000 mg | ORAL_TABLET | Freq: Every day | ORAL | Status: DC
  Administered 2016-06-21 – 2016-06-23 (×3): 15 mg via ORAL
  Filled 2016-06-21 (×3): qty 1

## 2016-06-21 MED ORDER — VANCOMYCIN HCL IN DEXTROSE 750-5 MG/150ML-% IV SOLN
750.0000 mg | Freq: Two times a day (BID) | INTRAVENOUS | Status: DC
  Administered 2016-06-21 – 2016-06-22 (×3): 750 mg via INTRAVENOUS
  Filled 2016-06-21 (×4): qty 150

## 2016-06-21 MED ORDER — ALBUTEROL SULFATE (2.5 MG/3ML) 0.083% IN NEBU
2.5000 mg | INHALATION_SOLUTION | Freq: Once | RESPIRATORY_TRACT | Status: AC
  Administered 2016-06-21: 2.5 mg via RESPIRATORY_TRACT
  Filled 2016-06-21: qty 3

## 2016-06-21 MED ORDER — POLYETHYLENE GLYCOL 3350 17 G PO PACK: 17.0000 g | PACK | Freq: Every day | ORAL | Status: DC | PRN

## 2016-06-21 MED ORDER — SODIUM CHLORIDE 0.9 % IV BOLUS (SEPSIS)
2500.0000 mL | Freq: Once | INTRAVENOUS | Status: AC
  Administered 2016-06-21: 2500 mL via INTRAVENOUS

## 2016-06-21 MED ORDER — ADULT MULTIVITAMIN W/MINERALS CH
1.0000 | ORAL_TABLET | Freq: Every day | ORAL | Status: DC
  Administered 2016-06-22: 1 via ORAL
  Filled 2016-06-21: qty 1

## 2016-06-21 MED ORDER — BISACODYL 10 MG RE SUPP: 10.0000 mg | Freq: Every day | RECTAL | Status: DC | PRN

## 2016-06-21 NOTE — H&P (Signed)
TRH H&P   Patient Demographics:    Tim Ross, is a 82 y.o. male  MRN: [id number]   DOB - 1935/03/06  Admit Date - 06/21/2016  Outpatient Primary MD for the patient is Garret Reddish, MD  Referring MD/NP/PA: Dr Tomi Bamberger  Outpatient Specialists: Oncology DR Irene Limbo, RAD/Onc Dr Sondra Come  Patient coming from: Home  Chief Complaint  Patient presents with  . Shortness of Breath      HPI:    Tim Ross  is a 82 y.o. male, With past medical history of adenocarcinoma of the lung stage IV with metastases to bone and adrenal glands, currently on radiation therapy, PE/bilateral DVT on Eliquis, patient presents with cough, productive, worsening dyspnea, generalized fatigue, progressive weakness, loss of appetite and oral intake, he denies any chest pain, nausea or vomiting, no coffee-ground emesis, no bright red blood per rectum, no dysuria or polyuria. Patient febrile 101.8 in ED, chest x-ray with worsening pneumonia/postobstructive , treated with vancomycin and cefepime, influenza panel pending, worsening hypoxia, currently on nonrebreather, supposed to receive 2 units PRBC today and in infusion center.    Review of systems:    In addition to the HPI above,  Report fever and chills No Headache, No changes with Vision or hearing, No problems swallowing food or Liquids, No Chest pain, complains of productive Cough and Shortness of Breath, No Abdominal pain, No Nausea or Vommitting, Bowel movements are regular, No Blood in stool or Urine, No dysuria, No new skin rashes or bruises, No new joints pains-aches,  No new weakness, tingling, numbness in any extremity, report generalized weakness and fatigue No recent weight gain or loss, reports poor appetite No polyuria, polydypsia or polyphagia, No significant Mental Stressors.  A full 10 point Review of Systems was done, except as stated  above, all other Review of Systems were negative.   With Past History of the following :    Past Medical History:  Diagnosis Date  . Cancer ([place])   . Colonic polyp   . GERD (gastroesophageal reflux disease)   . History of radiation therapy 04/07/16-04/23/16   right humerus 30 Gy in 10 fractions  . Hyperlipidemia   . Hypertension   . Muscular degeneration   . Pneumonia 2002  . Testicular cyst    removed-noncancerous      Past Surgical History:  Procedure Laterality Date  . CATARACT EXTRACTION     right side  . COLONOSCOPY N/A 03/14/2016   Procedure: COLONOSCOPY;  Surgeon: Doran Stabler, MD;  Location: [facility];  Service: Endoscopy;  Laterality: N/A;  . ESOPHAGOGASTRODUODENOSCOPY N/A 03/11/2016   Procedure: ESOPHAGOGASTRODUODENOSCOPY (EGD);  Surgeon: Doran Stabler, MD;  Location: [facility];  Service: Endoscopy;  Laterality: N/A;  . GIVENS CAPSULE STUDY N/A 03/14/2016   Procedure: GIVENS CAPSULE STUDY;  Surgeon: Doran Stabler, MD;  Location: [facility];  Service: Endoscopy;  Laterality: N/A;  .  KNEE SURGERY     scope on both  . LUMBAR LAMINECTOMY    . TRANSURETHRAL RESECTION OF PROSTATE     resolved issues      Social History:     Social History  Substance Use Topics  . Smoking status: Former Smoker    Packs/day: 1.50    Years: 40.00    Types: Cigarettes    Quit date: 12/30/1990  . Smokeless tobacco: Never Used  . Alcohol use No     Lives - At home with wife  Mobility - with walker    Family History :     Family History  Problem Relation Age of Onset  . Diabetes Mother   . Hypertension Mother   . Heart attack Father     late 72s, former smoker  . Heart disease Brother     67s, smokers  . Colon cancer Sister       Home Medications:   Prior to Admission medications   Medication Sig Start Date End Date Taking? Authorizing Provider  apixaban (ELIQUIS) 5 MG TABS tablet Take 1 tablet (5 mg total) by mouth 2 (two) times daily.  Start tonight 06/13/16  Yes Debbe Odea, MD  benzonatate (TESSALON) 200 MG capsule Take 1 capsule (200 mg total) by mouth 3 (three) times daily as needed for cough. 06/13/16  Yes Debbe Odea, MD  chlorpheniramine-HYDROcodone (TUSSIONEX) 10-8 MG/5ML SUER Take 5 mLs by mouth every 12 (twelve) hours as needed for cough. 06/13/16  Yes Debbe Odea, MD  CINNAMON PO Take 2 tablets by mouth every morning.    Yes Historical Provider, MD  emollient (BIAFINE) cream Apply topically 2 (two) times daily.   Yes Historical Provider, MD  folic acid (FOLVITE) 1 MG tablet TK 1 T PO D.START 5-7 DAYS BEFORE ALIMTA CHEMOTHERAPY.CONTINUE UNTIL 21 DAYS AFTER ALIMTA COMPLETED 03/27/16  Yes Historical Provider, MD  LORazepam (ATIVAN) 0.5 MG tablet Take 1 tablet (0.5 mg total) by mouth every 8 (eight) hours as needed for anxiety. 06/16/16  Yes Brunetta Genera, MD  mirtazapine (REMERON) 15 MG tablet Take 1 tablet (15 mg total) by mouth at bedtime. 04/14/16  Yes Brunetta Genera, MD  Multiple Vitamin (MULTIVITAMIN WITH MINERALS) TABS tablet Take 1 tablet by mouth every morning.    Yes Historical Provider, MD  Multiple Vitamins-Minerals (ICAPS) CAPS Take 1 capsule by mouth 2 (two) times daily.   Yes Historical Provider, MD  Omega-3 Fatty Acids (FISH OIL PO) Take 1 capsule by mouth every morning.    Yes Historical Provider, MD  omeprazole (PRILOSEC) 20 MG capsule Take 1 capsule (20 mg total) by mouth daily. 03/16/16  Yes Clanford Marisa Hua, MD  ondansetron (ZOFRAN) 8 MG tablet Take 1 tablet (8 mg total) by mouth 2 (two) times daily as needed (Nausea or vomiting). 06/08/16  Yes Brunetta Genera, MD  oxyCODONE (OXYCONTIN) 10 mg 12 hr tablet Take 1 tablet (10 mg total) by mouth every 12 (twelve) hours as needed. 06/16/16  Yes Gautam Juleen China, MD  oxyCODONE (ROXICODONE) 5 MG immediate release tablet Take 1-2 tablets (5-10 mg total) by mouth every 4 (four) hours as needed for moderate pain, severe pain or breakthrough pain.  06/13/16  Yes Debbe Odea, MD  polyethylene glycol (MIRALAX) packet Take 17 g by mouth daily. 03/27/16  Yes Gautam Juleen China, MD  prochlorperazine (COMPAZINE) 10 MG tablet Take 1 tablet (10 mg total) by mouth every 6 (six) hours as needed for nausea or vomiting. 04/03/16  Yes  Brunetta Genera, MD  senna-docusate (SENOKOT-S) 8.6-50 MG tablet Take 2 tablets by mouth 2 (two) times daily. 03/27/16  Yes Brunetta Genera, MD  feeding supplement, ENSURE ENLIVE, (ENSURE ENLIVE) LIQD Take 237 mLs by mouth 2 (two) times daily between meals. 03/16/16   Clanford Marisa Hua, MD  levofloxacin (LEVAQUIN) 750 MG tablet Take 1 tablet (750 mg total) by mouth daily. Patient not taking: Reported on 06/21/2016 06/13/16   Debbe Odea, MD  prochlorperazine (COMPAZINE) 10 MG tablet Take 1 tablet (10 mg total) by mouth every 6 (six) hours as needed (Nausea or vomiting). 06/08/16   Brunetta Genera, MD     Allergies:     Allergies  Allergen Reactions  . Celebrex [Celecoxib] Hives     Physical Exam:   Vitals  Blood pressure 116/60, pulse 108, temperature 101.8 F (38.8 C), temperature source Rectal, resp. rate 18, height '6\' 1"'$  (1.854 m), weight 86.2 kg (190 lb), SpO2 93 %.   1. General elderly frail chronically appearing male lying in bed in NAD,  On NRB.  2. Normal affect and insight, Not Suicidal or Homicidal, Awake Alert, Oriented X 3.  3. No F.N deficits, ALL C.Nerves Intact, Strength 5/5 all 4 extremities, Sensation intact all 4 extremities, Plantars down going.  4. Ears and Eyes appear Normal, Conjunctivae clear, PERRLA. Dry Oral Mucosa.  5. Supple Neck, No JVD, No Carotid Bruits.  6. Symmetrical Chest wall movement, diminished air entry left> right , no wheezing  7. Tachycardic, No Gallops, Rubs or Murmurs, No Parasternal Heave. +2 edema bilaterally(chronic per wife)  8. Positive Bowel Sounds, Abdomen Soft, No tenderness, No rebound -guarding or rigidity.  9.  No Cyanosis, Normal Skin  Turgor, No Skin Rash or Bruise.  10. Good muscle tone,  joints appear normal , no effusions, Normal ROM.     Data Review:    CBC  Recent Labs Lab 06/16/16 1417 06/20/16 1359 06/21/16 0858  WBC 16.8* 13.6* 11.8*  HGB 8.8* 8.7* 8.1*  HCT 27.9* 26.9* 25.4*  PLT 225 292 202  MCV 93.9 94.0 94.4  MCH 29.6 30.3 30.1  MCHC 31.5* 32.2 31.9  RDW 18.9* 19.6* 18.9*  LYMPHSABS 1.7 1.1  --   MONOABS 1.8* 1.1*  --   EOSABS 0.1 0.1  --   BASOSABS 0.0 0.0  --    ------------------------------------------------------------------------------------------------------------------  Chemistries   Recent Labs Lab 06/16/16 1417 06/20/16 1359 06/21/16 0858  NA 135* 138 137  K 4.7 4.9 4.6  CL  --   --  106  CO2 21* 24 22  GLUCOSE 110 120 132*  BUN 30.2* 35.9* 34*  CREATININE 1.3 1.2 1.39*  CALCIUM 8.4 8.4 7.5*  AST 64* 65*  --   ALT 55 44  --   ALKPHOS 71 69  --   BILITOT 0.25 0.35  --    ------------------------------------------------------------------------------------------------------------------ estimated creatinine clearance is 48.7 mL/min (by C-G formula based on SCr of 1.39 mg/dL (H)). ------------------------------------------------------------------------------------------------------------------ No results for input(s): TSH, T4TOTAL, T3FREE, THYROIDAB in the last 72 hours.  Invalid input(s): FREET3  Coagulation profile No results for input(s): INR, PROTIME in the last 168 hours. ------------------------------------------------------------------------------------------------------------------- No results for input(s): DDIMER in the last 72 hours. -------------------------------------------------------------------------------------------------------------------  Cardiac Enzymes No results for input(s): CKMB, TROPONINI, MYOGLOBIN in the last 168 hours.  Invalid input(s):  CK ------------------------------------------------------------------------------------------------------------------    Component Value Date/Time   BNP 21.9 03/09/2016 1456     ---------------------------------------------------------------------------------------------------------------  Urinalysis    Component Value Date/Time  COLORURINE YELLOW 03/09/2016 [place] 03/09/2016 1754   LABSPEC 1.023 03/09/2016 1754   PHURINE 5.5 03/09/2016 1754   GLUCOSEU NEGATIVE 03/09/2016 1754   HGBUR NEGATIVE 03/09/2016 1754   BILIRUBINUR NEGATIVE 03/09/2016 1754   BILIRUBINUR n 06/07/2014 0929   KETONESUR NEGATIVE 03/09/2016 1754   PROTEINUR NEGATIVE 03/09/2016 1754   UROBILINOGEN 0.2 06/07/2014 0929   NITRITE NEGATIVE 03/09/2016 1754   LEUKOCYTESUR NEGATIVE 03/09/2016 1754    ----------------------------------------------------------------------------------------------------------------   Imaging Results:    Dg Chest Port 1 View  Result Date: 06/21/2016 CLINICAL DATA:  82 year old male with shortness of breath and cough. Stage IV lung cancer. Initial encounter. EXAM: PORTABLE CHEST 1 VIEW COMPARISON:  06/14/2016 and earlier. FINDINGS: Portable AP semi upright view at at 0810 hours. Increased confluence and density of diffuse left mid and upper lung opacity which was previously thought to represent lung cancer with lymphangitic carcinomatosis. Small left pleural effusion is stable. No superimposed pneumothorax or pulmonary edema. Stable and negative right lung. Stable cardiac size and mediastinal contours. Visualized tracheal air column is within normal limits. IMPRESSION: 1. Progressed density of the left mid and upper lung. 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 2. Small left pleural effusion is stable. Electronically Signed   By: Genevie Ann M.D.   On: 06/21/2016 08:30    My personal review of EKG: Rhythm NSR,  Rate  125 /min, QTc 447 , no Acute ST changes   Assessment & Plan:    Active Problems:   Hyperlipidemia   Essential hypertension   GERD   Acute pulmonary embolism (HCC)   Neoplasm related pain   DVT of lower extremity, bilateral (HCC)   Adenocarcinoma of left lung, stage 4 (HCC)   Bone metastases ([place])   HCAP (healthcare-associated pneumonia)   Acute on chronic hypoxic respiratory failure - Patient on baseline 2 L nasal cannula, with worsening respiratory failure, currently on NRP, is related to HCAP/post obstructive pneumonia, with baseline PE and lung cancer. 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  Metastatic adenocarcinoma of the lung  - Metastasis to bone, adrenal glands  - Dr Irene Limbo and Sondra Come following - Patientcurrently on radiation therapy, will notify radiation oncology on Monday patient is admitted to resume radiation, will notify Dr. Claiborne Billings on Monday patient is admitted, discussed with Dr. Alvy Bimler.  Depression - Continue mirtazapine  CKD 3 - stable  Anemia - Anemia of chronic disease, patient was supposed to be transfused 2 units PRBC in infusion center today, will go ahead and order him these 2 units  Malignancy related pain - Continue 10 mgoral MS Contin every 12 hours and when necessary oxycodone(home dose)   DVT Prophylaxis Eliquis  AM Labs Ordered, also please review Full Orders  Family Communication: Admission, patients condition and plan of care including tests being ordered  have been discussed with the patient , Wife and son who indicate understanding and agree with the plan and Code Status.  Code Status Full  Likely DC to pending further work up.  Condition GUARDED  Consults called: None  Admission status: Inpatient  Time spent in minutes : 65 minutes   Persia Lintner M.D on 06/21/2016 at 11:44 AM  Between 7am to 7pm - Pager - [phone]. After 7pm go to [email or web address] - password [facility]  [facility] - Office  [phone]

## 2016-06-21 NOTE — ED Notes (Signed)
Family at bedside. 

## 2016-06-21 NOTE — Progress Notes (Addendum)
Pharmacy Antibiotic Note  Tim Ross is a 82 y.o. male with metastatic adenocarcinoma of the lung on chemotherapy, admitted on 06/21/2016 with pneumonia.  Pharmacy has been consulted for vancomycin dosing.  Plan:  Vancomycin 1g IV x 1, then '750mg'$  IV q12h  Check trough at steady state, goal 15-20 mcg/ml  Cefepime 2g IV x 1 per MD, f/u if continued  Follow up renal function & cultures  Height: '6\' 1"'$  (185.4 cm) Weight: 190 lb (86.2 kg) IBW/kg (Calculated) : 79.9  Temp (24hrs), Avg:100.1 F (37.8 C), Min:98 F (36.7 C), Max:101.8 F (38.8 C)   Recent Labs Lab 06/16/16 1417 06/16/16 1417 06/20/16 1359 06/21/16 0858  WBC 16.8*  --  13.6* 11.8*  CREATININE  --  1.3 1.2  --     Estimated Creatinine Clearance: 56.4 mL/min (by C-G formula based on SCr of 1.2 mg/dL).    Allergies  Allergen Reactions  . Celebrex [Celecoxib] Hives    Antimicrobials this admission:  2/3 Vanc >> 2/3 Cefepime x 1  Dose adjustments this admission:  ---  Microbiology results:  2/3 BCx: sent 2/3 Influenza panel: pending  Thank you for allowing pharmacy to be a part of this patient's care.  Peggyann Juba, PharmD, BCPS Pager: [phone] 06/21/2016 9:31 AM   Addendum: Pharmacy consulted to continue cefepime dosing.  For CrCl 48 ml/min, will use 1g IV q12h and monitor and adjust and as needed.  Peggyann Juba, PharmD, BCPS 06/21/2016 12:18 PM

## 2016-06-21 NOTE — Progress Notes (Signed)
PHARMACIST - PHYSICIAN COMMUNICATION DR: Elgergawy CONCERNING:  Oseltamivir dosing  DESCRIPTION:   This patient has an order for Oseltamivir (Tamiflu) and has an Estimated Creatinine Clearance: 48.7 mL/min (by C-G formula based on SCr of 1.39 mg/dL (H)).. The package insert for Oseltamivir recommends '30mg'$  BID x 5 days for patients with CrCl 30-60 ml/min.  RECOMMENDATION: Oseltamivir '30mg'$  PO x 1 dose has been substituted for your patient. If you have any questions about this temporary substitution please feel free to call the Pharmacy at [phone] for assistance.  Minda Ditto PharmD Pager [phone] 06/21/2016, 11:11 AM

## 2016-06-21 NOTE — ED Triage Notes (Signed)
Pt with worsening shortness of breath since 5:30am. Pt reports feeling better yesterday, had seen MD and sats were low in MD's office. Pt's initial sats by EMS 74% on 3L [place]. Pt's sats 64% on RA. Pt arrived with NRB and sats 90%. Pt with hx of stage 4 lung cancer and was due to have a blood transfusion this morning here at the cancer center today. Pt c/o pain with cough.

## 2016-06-21 NOTE — ED Notes (Signed)
Bed: PQ24 Expected date: 06/21/16 Expected time: 7:24 AM Means of arrival: Ambulance Comments: [facility]

## 2016-06-21 NOTE — ED Provider Notes (Signed)
[facility] DEPT Provider Note   CSN: [id number] Arrival date & time: 06/21/16  0740     History   Chief Complaint Chief Complaint  Patient presents with  . Shortness of Breath    HPI Tim Ross is a 82 y.o. male.  HPI Patient has a history of metastatic adenocarcinoma of the lung complicated by post obstructive pneumonia and a malignant pleural effusion. The patient was admitted to the hospital recently on January 21 for treatment of pneumonia and pleural effusion.  He was discharged on January 26.  Patient's symptoms have progressively worsened following his discharge. He has had increasing weakness. He has been sitting in his chair mostly in has not been able to do much activity.  Last night and this morning he was feeling more short of breath. He is on supplemental nasal cannula oxygen at home but family members measured oxygen saturations in the 42s. EMS was called and he was brought into the emergency room. He was scheduled to have an outpatient blood transfusion today. Denies any chest pain. He denies any vomiting or diarrhea. No fevers. He continues to feel weak and short of breath but his symptoms have improved after he was placed on a nonrebreather mask. Past Medical History:  Diagnosis Date  . Cancer ([place])   . Colonic polyp   . GERD (gastroesophageal reflux disease)   . History of radiation therapy 04/07/16-04/23/16   right humerus 30 Gy in 10 fractions  . Hyperlipidemia   . Hypertension   . Muscular degeneration   . Pneumonia 2002  . Testicular cyst    removed-noncancerous    Patient Active Problem List   Diagnosis Date Noted  . HCAP (healthcare-associated pneumonia) 06/21/2016  . Adenocarcinoma, lung ([place])   . Pleural effusion, bilateral   . Pneumonia 06/08/2016  . Goals of care, counseling/discussion 06/08/2016  . Adenocarcinoma of left lung, stage 4 ([place]) 03/27/2016  . Bone metastases ([place]) 03/27/2016  . DVT of lower extremity, bilateral ([place])  03/14/2016  . Metastatic Non-small cell lung cancer  03/14/2016  . Acute gout of right foot 03/14/2016  . Malnutrition of moderate degree 03/12/2016  . Acute blood loss anemia   . Neoplasm related pain   . GI bleed 03/09/2016  . Acute pulmonary embolism ([place]) 03/04/2016  . CKD (chronic kidney disease), stage III 06/08/2014  . Hyperglycemia 06/08/2014  . Former smoker 05/29/2014  . Macular degeneration   . Hyperlipidemia 04/29/2007  . Essential hypertension 04/29/2007  . GERD 04/29/2007  . History of colonic polyps 04/29/2007    Past Surgical History:  Procedure Laterality Date  . CATARACT EXTRACTION     right side  . COLONOSCOPY N/A 03/14/2016   Procedure: COLONOSCOPY;  Surgeon: Doran Stabler, MD;  Location: [facility] ENDOSCOPY;  Service: Endoscopy;  Laterality: N/A;  . ESOPHAGOGASTRODUODENOSCOPY N/A 03/11/2016   Procedure: ESOPHAGOGASTRODUODENOSCOPY (EGD);  Surgeon: Doran Stabler, MD;  Location: [facility];  Service: Endoscopy;  Laterality: N/A;  . GIVENS CAPSULE STUDY N/A 03/14/2016   Procedure: GIVENS CAPSULE STUDY;  Surgeon: Doran Stabler, MD;  Location: [facility] ENDOSCOPY;  Service: Endoscopy;  Laterality: N/A;  . KNEE SURGERY     scope on both  . LUMBAR LAMINECTOMY    . TRANSURETHRAL RESECTION OF PROSTATE     resolved issues       Home Medications    Prior to Admission medications   Medication Sig Start Date End Date Taking? Authorizing Provider  apixaban (ELIQUIS) 5 MG TABS tablet Take  1 tablet (5 mg total) by mouth 2 (two) times daily. Start tonight 06/13/16  Yes Debbe Odea, MD  benzonatate (TESSALON) 200 MG capsule Take 1 capsule (200 mg total) by mouth 3 (three) times daily as needed for cough. 06/13/16  Yes Debbe Odea, MD  chlorpheniramine-HYDROcodone (TUSSIONEX) 10-8 MG/5ML SUER Take 5 mLs by mouth every 12 (twelve) hours as needed for cough. 06/13/16  Yes Debbe Odea, MD  CINNAMON PO Take 2 tablets by mouth every morning.    Yes Historical Provider, MD    emollient (BIAFINE) cream Apply topically 2 (two) times daily.   Yes Historical Provider, MD  folic acid (FOLVITE) 1 MG tablet TK 1 T PO D.START 5-7 DAYS BEFORE ALIMTA CHEMOTHERAPY.CONTINUE UNTIL 21 DAYS AFTER ALIMTA COMPLETED 03/27/16  Yes Historical Provider, MD  LORazepam (ATIVAN) 0.5 MG tablet Take 1 tablet (0.5 mg total) by mouth every 8 (eight) hours as needed for anxiety. 06/16/16  Yes Brunetta Genera, MD  mirtazapine (REMERON) 15 MG tablet Take 1 tablet (15 mg total) by mouth at bedtime. 04/14/16  Yes Brunetta Genera, MD  Multiple Vitamin (MULTIVITAMIN WITH MINERALS) TABS tablet Take 1 tablet by mouth every morning.    Yes Historical Provider, MD  Multiple Vitamins-Minerals (ICAPS) CAPS Take 1 capsule by mouth 2 (two) times daily.   Yes Historical Provider, MD  Omega-3 Fatty Acids (FISH OIL PO) Take 1 capsule by mouth every morning.    Yes Historical Provider, MD  omeprazole (PRILOSEC) 20 MG capsule Take 1 capsule (20 mg total) by mouth daily. 03/16/16  Yes Clanford Marisa Hua, MD  ondansetron (ZOFRAN) 8 MG tablet Take 1 tablet (8 mg total) by mouth 2 (two) times daily as needed (Nausea or vomiting). 06/08/16  Yes Brunetta Genera, MD  oxyCODONE (OXYCONTIN) 10 mg 12 hr tablet Take 1 tablet (10 mg total) by mouth every 12 (twelve) hours as needed. 06/16/16  Yes Gautam Juleen China, MD  oxyCODONE (ROXICODONE) 5 MG immediate release tablet Take 1-2 tablets (5-10 mg total) by mouth every 4 (four) hours as needed for moderate pain, severe pain or breakthrough pain. 06/13/16  Yes Debbe Odea, MD  polyethylene glycol (MIRALAX) packet Take 17 g by mouth daily. 03/27/16  Yes Gautam Juleen China, MD  prochlorperazine (COMPAZINE) 10 MG tablet Take 1 tablet (10 mg total) by mouth every 6 (six) hours as needed for nausea or vomiting. 04/03/16  Yes Brunetta Genera, MD  senna-docusate (SENOKOT-S) 8.6-50 MG tablet Take 2 tablets by mouth 2 (two) times daily. 03/27/16  Yes Brunetta Genera, MD   feeding supplement, ENSURE ENLIVE, (ENSURE ENLIVE) LIQD Take 237 mLs by mouth 2 (two) times daily between meals. 03/16/16   Clanford Marisa Hua, MD  levofloxacin (LEVAQUIN) 750 MG tablet Take 1 tablet (750 mg total) by mouth daily. Patient not taking: Reported on 06/21/2016 06/13/16   Debbe Odea, MD  prochlorperazine (COMPAZINE) 10 MG tablet Take 1 tablet (10 mg total) by mouth every 6 (six) hours as needed (Nausea or vomiting). 06/08/16   Brunetta Genera, MD    Family History Family History  Problem Relation Age of Onset  . Diabetes Mother   . Hypertension Mother   . Heart attack Father     late 25s, former smoker  . Heart disease Brother     29s, smokers  . Colon cancer Sister     Social History Social History  Substance Use Topics  . Smoking status: Former Smoker    Packs/day: 1.50  Years: 40.00    Types: Cigarettes    Quit date: 12/30/1990  . Smokeless tobacco: Never Used  . Alcohol use No     Allergies   Celebrex [celecoxib]   Review of Systems Review of Systems  All other systems reviewed and are negative.    Physical Exam Updated Vital Signs BP 115/70   Pulse (!) 122   Temp 101.8 F (38.8 C) (Rectal)   Resp 26   Ht '6\' 1"'$  (1.854 m)   Wt 86.2 kg   SpO2 94%   BMI 25.07 kg/m   Physical Exam  Constitutional: No distress.  HENT:  Head: Normocephalic and atraumatic.  Right Ear: External ear normal.  Left Ear: External ear normal.  Eyes: Conjunctivae are normal. Right eye exhibits no discharge. Left eye exhibits no discharge. No scleral icterus.  Neck: Neck supple. No tracheal deviation present.  Cardiovascular: Intact distal pulses.  An irregularly irregular rhythm present. Tachycardia present.   Pulmonary/Chest: Effort normal. No stridor. No respiratory distress. He has wheezes. He has no rales.  Decreased breath sounds  Abdominal: Soft. Bowel sounds are normal. He exhibits no distension. There is no tenderness. There is no rebound and no  guarding.  Musculoskeletal: He exhibits edema. He exhibits no tenderness.  Edema bilateral lower extremities as well as right hand  Neurological: He is alert. He has normal strength. No cranial nerve deficit (no facial droop, extraocular movements intact, no slurred speech) or sensory deficit. He exhibits normal muscle tone. He displays no seizure activity. Coordination normal.  Skin: Skin is warm and dry. No rash noted. He is not diaphoretic.  Psychiatric: He has a normal mood and affect.  Nursing note and vitals reviewed.    ED Treatments / Results  Labs (all labs ordered are listed, but only abnormal results are displayed) Labs Reviewed  BASIC METABOLIC PANEL - Abnormal; Notable for the following:       Result Value   Glucose, Bld 132 (*)    BUN 34 (*)    Creatinine, Ser 1.39 (*)    Calcium 7.5 (*)    GFR calc non Af Amer 47 (*)    GFR calc Af Amer 54 (*)    All other components within normal limits  CBC - Abnormal; Notable for the following:    WBC 11.8 (*)    RBC 2.69 (*)    Hemoglobin 8.1 (*)    HCT 25.4 (*)    RDW 18.9 (*)    All other components within normal limits  BLOOD GAS, ARTERIAL - Abnormal; Notable for the following:    pO2, Arterial 54.2 (*)    All other components within normal limits  I-STAT TROPOININ, ED - Abnormal; Notable for the following:    Troponin i, poc 0.11 (*)    All other components within normal limits  CULTURE, BLOOD (ROUTINE X 2)  CULTURE, BLOOD (ROUTINE X 2)  INFLUENZA PANEL BY PCR (TYPE A & B)  TYPE AND SCREEN   EKG Sinus tachycardia, rate 125 Atrial premature complex Left anterior fascicular block Abnormal R-wave progression, early transition Nonspecific T abnormalities, lateral leads No significant change since last tracing Confirmed by Flecia Shutter MD-J, Tricia [place] ([id number]) on 06/21/2016 7:54:37 AM  Radiology Dg Chest Port 1 View  Result Date: 06/21/2016 CLINICAL DATA:  82 year old male with shortness of breath and cough. Stage IV lung cancer.  Initial encounter. EXAM: PORTABLE CHEST 1 VIEW COMPARISON:  06/14/2016 and earlier. FINDINGS: Portable AP semi upright view at at 0810 hours. Increased  confluence and density of diffuse left mid and upper lung opacity which was previously thought to represent lung cancer with lymphangitic carcinomatosis. Small left pleural effusion is stable. No superimposed pneumothorax or pulmonary edema. Stable and negative right lung. Stable cardiac size and mediastinal contours. Visualized tracheal air column is within normal limits. IMPRESSION: 1. Progressed density of the left mid and upper lung. 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 2. Small left pleural effusion is stable. Electronically Signed   By: Genevie Ann M.D.   On: 06/21/2016 08:30    Procedures .Critical Care Performed by: Dorie Rank Authorized by: Dorie Rank   Critical care provider statement:    Critical care time (minutes):  30   Critical care was time spent personally by me on the following activities:  Discussions with consultants, evaluation of patient's response to treatment, examination of patient, ordering and performing treatments and interventions, ordering and review of laboratory studies, ordering and review of radiographic studies, pulse oximetry, re-evaluation of patient's condition, obtaining history from patient or surrogate and review of old charts   (including critical care time)  Medications Ordered in ED Medications  vancomycin (VANCOCIN) IVPB 1000 mg/200 mL premix (1,000 mg Intravenous New Bag/Given 06/21/16 1047)  vancomycin (VANCOCIN) IVPB 750 mg/150 ml premix (not administered)  sodium chloride 0.9 % bolus 2,500 mL (not administered)  oseltamivir (TAMIFLU) capsule 75 mg (not administered)  albuterol (PROVENTIL) (2.5 MG/3ML) 0.083% nebulizer solution 2.5 mg (2.5 mg Nebulization Given 06/21/16 0842)  ipratropium-albuterol (DUONEB) 0.5-2.5 (3) MG/3ML nebulizer  solution 3 mL (3 mLs Nebulization Given 06/21/16 0842)  ceFEPIme (MAXIPIME) 2 g in dextrose 5 % 50 mL IVPB (0 g Intravenous Stopped 06/21/16 1046)     Initial Impression / Assessment and Plan / ED Course  I have reviewed the triage vital signs and the nursing notes.  Pertinent labs & imaging results that were available during my care of the patient were reviewed by me and considered in my medical decision making (see chart for details).  Clinical Course as of Jun 21 1099  Sat Jun 21, 2016  0908 CXR shows progression of the infiltrate.  ?superimposed infection vs the malignancy  [JK]  1101 D/w Dr Renne Crigler.  Will admit to stepdown unit  [JK]    Clinical Course User Index [JK] Dorie Rank, MD   Patient presents to the emergency room with worsening shortness of breath. In the emergency room today he is febrile and his worsening infiltrates. He has known malignancy and that portion of the lung.   I am concerned about recurrent postobstructive pneumonia versus worsening of his malignancy. Patient was started on antibiotics empirically. I have ordered 30 mL/kg bolus.  Will need to monitor for hypotension/sepsis.  Plan on admission to the hospital to the stepdown unit for further treatment.  Final Clinical Impressions(s) / ED Diagnoses   Final diagnoses:  HCAP (healthcare-associated pneumonia)  Malignant neoplasm of lung, unspecified laterality, unspecified part of lung ([place])    New Prescriptions New Prescriptions   No medications on file     Dorie Rank, MD 06/21/16 1101

## 2016-06-22 ENCOUNTER — Inpatient Hospital Stay (HOSPITAL_COMMUNITY): Payer: Medicare Other

## 2016-06-22 DIAGNOSIS — I10 Essential (primary) hypertension: Secondary | ICD-10-CM

## 2016-06-22 DIAGNOSIS — D649 Anemia, unspecified: Secondary | ICD-10-CM

## 2016-06-22 DIAGNOSIS — J189 Pneumonia, unspecified organism: Principal | ICD-10-CM

## 2016-06-22 DIAGNOSIS — I824Z3 Acute embolism and thrombosis of unspecified deep veins of distal lower extremity, bilateral: Secondary | ICD-10-CM

## 2016-06-22 LAB — BASIC METABOLIC PANEL
ANION GAP: 5 (ref 5–15)
BUN: 27 mg/dL — ABNORMAL HIGH (ref 6–20)
CHLORIDE: 110 mmol/L (ref 101–111)
CO2: 22 mmol/L (ref 22–32)
Calcium: 6.8 mg/dL — ABNORMAL LOW (ref 8.9–10.3)
Creatinine, Ser: 1.24 mg/dL (ref 0.61–1.24)
GFR calc Af Amer: >60 mL/min (ref >60)
GFR, EST NON AFRICAN AMERICAN: 54 mL/min — AB (ref >60)
GLUCOSE: 122 mg/dL — AB (ref 65–99)
POTASSIUM: 4.4 mmol/L (ref 3.5–5.1)
Sodium: 137 mmol/L (ref 135–145)

## 2016-06-22 LAB — CBC
HEMATOCRIT: 28.3 % — AB (ref 39.0–52.0)
HEMOGLOBIN: 9.1 g/dL — AB (ref 13.0–17.0)
MCH: 29.5 pg (ref 26.0–34.0)
MCHC: 32.2 g/dL (ref 30.0–36.0)
MCV: 91.9 fL (ref 78.0–100.0)
Platelets: 168 10*3/uL (ref 150–400)
RBC: 3.08 MIL/uL — ABNORMAL LOW (ref 4.22–5.81)
RDW: 18.1 % — ABNORMAL HIGH (ref 11.5–15.5)
WBC: 11.5 10*3/uL — ABNORMAL HIGH (ref 4.0–10.5)

## 2016-06-22 LAB — TROPONIN I: TROPONIN I: 0.11 ng/mL — AB (ref <0.03)

## 2016-06-22 LAB — HIV ANTIBODY (ROUTINE TESTING W REFLEX): HIV Screen 4th Generation wRfx: NONREACTIVE

## 2016-06-22 LAB — STREP PNEUMONIAE URINARY ANTIGEN: STREP PNEUMO URINARY ANTIGEN: NEGATIVE

## 2016-06-22 MED ORDER — METOPROLOL TARTRATE 5 MG/5ML IV SOLN: 5.0000 mg | Freq: Four times a day (QID) | INTRAVENOUS | Status: DC | PRN

## 2016-06-22 MED ORDER — IOPAMIDOL (ISOVUE-300) INJECTION 61%
INTRAVENOUS | Status: AC
  Administered 2016-06-22: 100 mL
  Filled 2016-06-22: qty 100

## 2016-06-22 MED ORDER — METOPROLOL TARTRATE 12.5 MG HALF TABLET
12.5000 mg | ORAL_TABLET | Freq: Two times a day (BID) | ORAL | Status: DC
  Administered 2016-06-22: 12.5 mg via ORAL
  Filled 2016-06-22 (×2): qty 1

## 2016-06-22 MED ORDER — SODIUM CHLORIDE 0.9 % IJ SOLN
INTRAMUSCULAR | Status: AC
  Filled 2016-06-22: qty 50

## 2016-06-22 NOTE — Progress Notes (Addendum)
[facility] PROGRESS NOTE    Progress Note  Tim Ross  FFM:[id number] DOB: 1935-04-23 DOA: 06/21/2016 PCP: Garret Reddish, MD     Brief Narrative:   Tim Ross is an 82 y.o. 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 Start on HCAP treatment.  Assessment/Plan:   Acute on chronic hypoxic respiratory failure due to HCAP - Patient on baseline 2 L nasal cannula,  currently on NRB, is related to HCAP/post obstructive pneumonia, with baseline PE and lung cancer. - Sat > 88% on a NRB, with  minimal improvement. - Blood cultures and sputum cultures are pending - Continue with IV vancomycin and cefepime, Mucinex, and as needed - Influenza PCR negative, check Ct chest with contrast.  Elevated troponins - Most likely demand ischemia due to infection, anemia and hypoxia. - will try to keep HR < 100.  Hx of PE: Cont apixiban.  Depression - Continue mirtazapine  CKD 3 - stable  Normocytic Anemia - Anemia of chronic disease, s/p  2 units PRBC in infusion center on 2.3.2018  Malignancy related pain/  Bone metastases ([place]): - Continue 10 mgoral MS Contin every 12 hours and when necessary oxycodone(home dose)  Sinus tach: Due to HCAP:, start metorpolol orally.   DVT prophylaxis: [place] Communication:son Disposition Plan/Barrier to D/C: once respiratory status improved. Code Status:     Code Status Orders        Start     Ordered   06/21/16 1534  Full code  Continuous     06/21/16 1533    Code Status History    Date Active Date Inactive Code Status Order ID Comments User Context   06/08/2016  3:33 PM 06/13/2016  6:32 PM Full Code [id number]  Tawni Millers, MD ED   03/09/2016  6:46 PM 03/16/2016  7:06 PM Full Code [id number]  Florencia Reasons, MD Inpatient        IV Access:    Peripheral IV   Procedures and diagnostic studies:   Dg Chest Port 1 View  Result Date: 06/21/2016 CLINICAL DATA:  Dyspnea for 1 day. EXAM: PORTABLE CHEST 1 VIEW COMPARISON:  06/21/2016 FINDINGS: Persistent consolidation in the left upper lobe, unchanged. Patchy opacities in the left base, also unchanged. Chronic blunting of the left lateral costophrenic angle. Possible 5 mm nodule in the right apex K Right lung is otherwise clear. Pulmonary vasculature is normal. IMPRESSION: Persistent confluent consolidation in the left upper lobe. Persistent patchy opacities in the left base. Possible new nodule in the right apex. Electronically Signed   By: Andreas Newport M.D.   On: 06/21/2016 19:57   Dg Chest Port 1 View  Result Date: 06/21/2016 CLINICAL DATA:  82 year old male with shortness of breath and cough. Stage IV lung cancer. Initial encounter. EXAM: PORTABLE CHEST 1 VIEW COMPARISON:  06/14/2016 and earlier. FINDINGS: Portable AP semi upright view at at 0810 hours. Increased confluence and density of diffuse left mid and upper lung opacity which was previously thought to represent lung cancer with lymphangitic carcinomatosis. Small left pleural effusion is stable. No superimposed pneumothorax or pulmonary edema. Stable and negative right lung. Stable cardiac size and mediastinal contours. Visualized tracheal air column is within normal limits. IMPRESSION: 1. Progressed density of the left mid and upper  lung. 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 2. Small left pleural effusion is stable. Electronically Signed   By: Genevie Ann M.D.   On: 06/21/2016 08:30     Medical Consultants:    None.  Anti-Infectives:   IV vanc and cefepime  Subjective:    Tim Ross he relates his resp is slightly  better  Objective:    Vitals:   06/22/16 0200 06/22/16 0310 06/22/16 0400 06/22/16 0500  BP: (!) 114/56 (!) 114/56 (!) 104/57   Pulse:  94    Resp: '15 16 16   '$ Temp:    98.1 F (36.7 C)  TempSrc:    Axillary  SpO2: 98% 97% 95%   Weight:      Height:        Intake/Output Summary (Last 24 hours) at 06/22/16 0709 Last data filed at 06/22/16 0500  Gross per 24 hour  Intake          4266.25 ml  Output              600 ml  Net          3666.25 ml   Filed Weights   06/21/16 0740 06/21/16 1522  Weight: 86.2 kg (190 lb) 87 kg (191 lb 12.8 oz)    Exam: General exam: In no acute distress. Respiratory system: decrease air movement and crackles on the left lung Cardiovascular system: S1 & S2 heard, RRR. No JVD. Gastrointestinal system: Abdomen is nondistended, soft and nontender.  Central nervous system: Alert and oriented. No focal neurological deficits. Extremities: No pedal edema. Skin: No rashes, lesions or ulcers Psychiatry: Judgement and insight appear normal. Mood & affect appropriate.    Data Reviewed:    Labs: Basic Metabolic Panel:  Recent Labs Lab 06/16/16 1417 06/20/16 1359 06/21/16 0858 06/22/16 0350  NA 135* 138 137 137  K 4.7 4.9 4.6 4.4  CL  --   --  106 110  CO2 21* '24 22 22  '$ GLUCOSE 110 120 132* 122*  BUN 30.2* 35.9* 34* 27*  CREATININE 1.3 1.2 1.39* 1.24  CALCIUM 8.4 8.4 7.5* 6.8*   GFR Estimated Creatinine Clearance: 54.6 mL/min (by C-G formula based on SCr of 1.24 mg/dL). Liver Function Tests:  Recent Labs Lab 06/16/16 1417 06/20/16 1359  AST 64* 65*  ALT 55 44  ALKPHOS 71 69  BILITOT 0.25 0.35  PROT 6.2* 6.0*  ALBUMIN <2.0* <2.0*   No results for input(s): LIPASE, AMYLASE in the last 168 hours. No results for input(s): AMMONIA in the last 168 hours. Coagulation profile No results for input(s): INR, PROTIME in the last 168 hours.  CBC:  Recent Labs Lab 06/16/16 1417 06/20/16 1359 06/21/16 0858 06/22/16 0350  WBC 16.8*  13.6* 11.8* 11.5*  NEUTROABS 13.2* 11.3*  --   --   HGB 8.8* 8.7* 8.1* 9.1*  HCT 27.9* 26.9* 25.4* 28.3*  MCV 93.9 94.0 94.4 91.9  PLT 225 292 202 168   Cardiac Enzymes:  Recent Labs Lab 06/21/16 1644 06/21/16 2135 06/22/16 0350  TROPONINI 0.08* 0.13* 0.11*   BNP (last 3 results)  Recent Labs  02/27/16 1031  PROBNP 37.0   CBG: No results for input(s): GLUCAP in the last 168 hours. D-Dimer: No results for input(s): DDIMER in the last 72 hours. Hgb A1c: No results for input(s): HGBA1C in the last 72 hours. Lipid Profile: No results for input(s): CHOL, HDL, LDLCALC, TRIG, CHOLHDL, LDLDIRECT in the last 72  hours. Thyroid function studies: No results for input(s): TSH, T4TOTAL, T3FREE, THYROIDAB in the last 72 hours.  Invalid input(s): FREET3 Anemia work up: No results for input(s): VITAMINB12, FOLATE, FERRITIN, TIBC, IRON, RETICCTPCT in the last 72 hours. Sepsis Labs:  Recent Labs Lab 06/16/16 1417 06/20/16 1359 06/21/16 0858 06/22/16 0350  WBC 16.8* 13.6* 11.8* 11.5*   Microbiology Recent Results (from the past 240 hour(s))  MRSA PCR Screening     Status: None   Collection Time: 06/21/16  3:49 PM  Result Value Ref Range Status   MRSA by PCR NEGATIVE NEGATIVE Final    Comment:        The GeneXpert MRSA Assay (FDA approved for NASAL specimens only), is one component of a comprehensive MRSA colonization surveillance program. It is not intended to diagnose MRSA infection nor to guide or monitor treatment for MRSA infections.   Culture, sputum-assessment     Status: None   Collection Time: 06/21/16  8:54 PM  Result Value Ref Range Status   Specimen Description SPUTUM  Final   Special Requests NONE  Final   Sputum evaluation THIS SPECIMEN IS ACCEPTABLE FOR SPUTUM CULTURE  Final   Report Status 06/21/2016 FINAL  Final     Medications:   . apixaban  5 mg Oral BID  . ceFEPime (MAXIPIME) IV  1 g Intravenous Q12H  . feeding supplement (ENSURE ENLIVE)   237 mL Oral BID BM  . folic acid  1 mg Oral Daily  . guaiFENesin  1,200 mg Oral BID  . mouth rinse  15 mL Mouth Rinse BID  . mirtazapine  15 mg Oral QHS  . multivitamin  1 tablet Oral BID  . multivitamin with minerals  1 tablet Oral Daily  . oxyCODONE  10 mg Oral Q12H  . pantoprazole  40 mg Oral Daily  . polyethylene glycol  17 g Oral Daily  . senna-docusate  2 tablet Oral BID  . vancomycin  750 mg Intravenous Q12H   Continuous Infusions: . sodium chloride 75 mL (06/22/16 0140)    Time spent: 25 min   LOS: 1 day   Charlynne Cousins  [facility] Pager [phone]  *Please refer to [email or web address], password [email or web address] to get updated schedule on who will round on this patient, as hospitalists switch teams weekly. If 7PM-7AM, please contact night-coverage at [email or web address], password [email or web address] for any overnight needs.  06/22/2016, 7:09 AM

## 2016-06-22 NOTE — Progress Notes (Signed)
Tim Kitchen  HEMATOLOGY ONCOLOGY CLINIC NOTE  Date of service: .06/20/2016  Patient Care Team: Marin Olp, MD as PCP - General (Family Medicine) Latanya Maudlin, MD as Consulting Physician (Orthopedic Surgery) Gery Pray MD (Radiation Oncology)   CC: Follow-up for metastatic lung cancer  Diagnosis:  1)Metastatic Lung Adenocarcinoma with mets to bone , adrenal glands and concern for early mets to brain. 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 Patient has been referred to [facility] for deep enteroscopy for possible ablation. Has appointment in December 19th 2017    Current Treatment:   Atezolizumab C1D1 06/16/2016 Palliative RT to primary symptomatic left lung mass and T10 lesion.  Previous Treatment:  -Carboplatin/Alimta (palliative chemotherapy) x 3 cycles Low PDL 1 expression at 20%. EGFR , ALK , ROS 1 and BRAF mutation negative based on [employer] testing   -palliative radiation to the painful right humeral metastasis   INTERVAL HISTORY:  [name] is here for his toxicity check after 1st dose of Atezolizumab on 06/16/2016. He has started his palliative RT to the left lung mass and T10. He was recently admitted and seen in the hospital for DOE/SOB from malignant left pleural effusion and possible pneumonia. He notes breathing has been stable. Notes grade 1-2 fatigue from treatment + element of anemia. His O2 sat were inaccurately noted to be 78% due to cold hands and when rechecked were 97-100% on 3L/min [place] oxygen.   REVIEW OF SYSTEMS:    10 Point review of systems of done and is negative except as noted above.  . Past Medical History:  Diagnosis Date  . Cancer ([place])   . Colonic polyp   . GERD (gastroesophageal reflux disease)   . History of radiation therapy 04/07/16-04/23/16   right  humerus 30 Gy in 10 fractions  . Hyperlipidemia   . Hypertension   . Muscular degeneration   . Pneumonia 2002  . Testicular cyst    removed-noncancerous    . Past Surgical History:  Procedure Laterality Date  . CATARACT EXTRACTION     right side  . COLONOSCOPY N/A 03/14/2016   Procedure: COLONOSCOPY;  Surgeon: Doran Stabler, MD;  Location: [facility] ENDOSCOPY;  Service: Endoscopy;  Laterality: N/A;  . ESOPHAGOGASTRODUODENOSCOPY N/A 03/11/2016   Procedure: ESOPHAGOGASTRODUODENOSCOPY (EGD);  Surgeon: Doran Stabler, MD;  Location: [facility];  Service: Endoscopy;  Laterality: N/A;  . GIVENS CAPSULE STUDY N/A 03/14/2016   Procedure: GIVENS CAPSULE STUDY;  Surgeon: Doran Stabler, MD;  Location: [facility] ENDOSCOPY;  Service: Endoscopy;  Laterality: N/A;  . KNEE SURGERY     scope on both  . LUMBAR LAMINECTOMY    . TRANSURETHRAL RESECTION OF PROSTATE     resolved issues    . Social History  Substance Use Topics  . Smoking status: Former Smoker    Packs/day: 1.50    Years: 40.00    Types: Cigarettes    Quit date: 12/30/1990  . Smokeless tobacco: Never Used  . Alcohol use No    ALLERGIES:  is allergic to celebrex [celecoxib].  MEDICATIONS:  No current facility-administered medications for this visit.    No current outpatient prescriptions on file.   Facility-Administered Medications Ordered in Other Visits  Medication Dose Route Frequency Provider Last Rate Last Dose  . 0.9 %  sodium  chloride infusion   Intravenous Continuous Albertine Patricia, MD 75 mL/hr at 06/21/16 1551    . acetaminophen (TYLENOL) tablet 650 mg  650 mg Oral Q6H PRN Albertine Patricia, MD       Or  . acetaminophen (TYLENOL) suppository 650 mg  650 mg Rectal Q6H PRN Albertine Patricia, MD      . apixaban (ELIQUIS) tablet 5 mg  5 mg Oral BID Albertine Patricia, MD   5 mg at 06/21/16 2049  . benzonatate (TESSALON) capsule 200 mg  200 mg Oral TID PRN Albertine Patricia, MD   200 mg at 06/21/16 1845  .  bisacodyl (DULCOLAX) suppository 10 mg  10 mg Rectal Daily PRN Albertine Patricia, MD      . ceFEPIme (MAXIPIME) 1 g in dextrose 5 % 50 mL IVPB  1 g Intravenous Q12H Emiliano Dyer, RPH   1 g at 06/21/16 2309  . chlorpheniramine-HYDROcodone (TUSSIONEX) 10-8 MG/5ML suspension 5 mL  5 mL Oral Q12H PRN Albertine Patricia, MD   5 mL at 06/21/16 1658  . feeding supplement (ENSURE ENLIVE) (ENSURE ENLIVE) liquid 237 mL  237 mL Oral BID BM Dawood S Elgergawy, MD      . folic acid (FOLVITE) tablet 1 mg  1 mg Oral Daily Dawood S Elgergawy, MD      . guaiFENesin (MUCINEX) 12 hr tablet 1,200 mg  1,200 mg Oral BID Albertine Patricia, MD   1,200 mg at 06/21/16 2219  . LORazepam (ATIVAN) tablet 0.5 mg  0.5 mg Oral Q8H PRN Albertine Patricia, MD   0.5 mg at 06/21/16 1941  . MEDLINE mouth rinse  15 mL Mouth Rinse BID Albertine Patricia, MD      . mirtazapine (REMERON) tablet 15 mg  15 mg Oral QHS Albertine Patricia, MD   15 mg at 06/21/16 2219  . multivitamin (PROSIGHT) tablet 1 tablet  1 tablet Oral BID Albertine Patricia, MD   1 tablet at 06/21/16 2219  . multivitamin with minerals tablet 1 tablet  1 tablet Oral Daily Silver Huguenin Elgergawy, MD      . ondansetron (ZOFRAN) tablet 4 mg  4 mg Oral Q6H PRN Albertine Patricia, MD       Or  . ondansetron (ZOFRAN) injection 4 mg  4 mg Intravenous Q6H PRN Albertine Patricia, MD      . ondansetron (ZOFRAN) tablet 8 mg  8 mg Oral BID PRN Albertine Patricia, MD      . oxyCODONE (Oxy IR/ROXICODONE) immediate release tablet 5-10 mg  5-10 mg Oral Q4H PRN Albertine Patricia, MD   5 mg at 06/21/16 1845  . oxyCODONE (OXYCONTIN) 12 hr tablet 10 mg  10 mg Oral Q12H Albertine Patricia, MD   10 mg at 06/21/16 2048  . pantoprazole (PROTONIX) EC tablet 40 mg  40 mg Oral Daily Dawood S Elgergawy, MD      . polyethylene glycol (MIRALAX / GLYCOLAX) packet 17 g  17 g Oral Daily Dawood S Elgergawy, MD      . prochlorperazine (COMPAZINE) tablet 10 mg  10 mg Oral Q6H PRN Albertine Patricia,  MD      . prochlorperazine (COMPAZINE) tablet 10 mg  10 mg Oral Q6H PRN Albertine Patricia, MD      . senna-docusate (Senokot-S) tablet 2 tablet  2 tablet Oral BID Albertine Patricia, MD   2 tablet at 06/21/16 2219  . vancomycin (VANCOCIN) IVPB  750 mg/150 ml premix  750 mg Intravenous Q12H Emiliano Dyer, RPH   750 mg at 06/21/16 2312    PHYSICAL EXAMINATION: ECOG PERFORMANCE STATUS: 2 - Symptomatic, <50% confined to bed  . Vitals:   06/20/16 1415  BP: (!) 114/54  Pulse: (!) 124  Resp: 18  Temp: 98 F (36.7 C)    Filed Weights   06/20/16 1415  Weight: 186 lb 8 oz (84.6 kg)   .Body mass index is 24.61 kg/m.  GENERAL:alert, in no acute distress and comfortable SKIN: skin color, texture, turgor are normal, no rashes or significant lesions EYES: normal, conjunctiva are pink and non-injected, sclera clear OROPHARYNX:no exudate, no erythema and lips, buccal mucosa, and tongue normal  NECK: supple, no JVD, thyroid normal size, non-tender, without nodularity LYMPH:  no palpable lymphadenopathy in the cervical, axillary or inguinal LUNGS: clear to auscultation with normal respiratory effort HEART: regular rate & rhythm,  no murmurs and no lower extremity edema ABDOMEN: abdomen soft, non-tender, normoactive bowel sounds  Musculoskeletal: no cyanosis of digits and no clubbing  PSYCH: alert & oriented x 3 with fluent speech NEURO: no focal motor/sensory deficits  LABORATORY DATA:   I have reviewed the data as listed  . CBC Latest Ref Rng & Units 06/20/2016 06/16/2016  WBC 4.0 - 10.5 K/uL 13.6(H) 16.8(H)  Hemoglobin 13.0 - 17.0 g/dL 8.7(L) 8.8(L)  Hematocrit 39.0 - 52.0 % 26.9(L) 27.9(L)  Platelets 150 - 400 K/uL 292 225   . CMP Latest Ref Rng & Units 06/20/2016 06/16/2016  Glucose 65 - 99 mg/dL 120 110  BUN 6 - 20 mg/dL 35.9(H) 30.2(H)  Creatinine 0.61 - 1.24 mg/dL 1.2 1.3  Sodium 135 - 145 mmol/L 138 135(L)  Potassium 3.5 - 5.1 mmol/L 4.9 4.7  Chloride 101 - 111 mmol/L - -    CO2 22 - 32 mmol/L 24 21(L)  Calcium 8.9 - 10.3 mg/dL 8.4 8.4  Total Protein 6.4 - 8.3 g/dL 6.0(L) 6.2(L)  Total Bilirubin 0.20 - 1.20 mg/dL 0.35 0.25  Alkaline Phos 40 - 150 U/L 69 71  AST 5 - 34 U/L 65(H) 64(H)  ALT 0 - 55 U/L 44 55           RADIOGRAPHIC STUDIES: I have personally reviewed the radiological images as listed and agreed with the findings in the report.  ASSESSMENT & PLAN:   82 year old male with  #1 Metastatic Lung Adenocarcinoma  With mets to bones, adrenal gland Low PDL 1 expression at 20%. EGFR , ALK , ROS 1 and BRAF mutation negative based on [employer] testing  Patient is s/p 3 cycles of Carboplatin/Alimta. #2 painful right humerus metastatic lesion requiring significant pain medications. Pain resolved with palliative RT Assessment CT CAP on 05/27/2016 shows signs of interval progression of left lung mass and T10 lesion. MRI Brain 06/10/2016 - no brain mets Patient was recently admitted to hospital with Left sided malignant pleural effusion s/p therapeutic thoracentesis (cytology +ve for malignant cells) . Concern with possible pneumonia Plan -patient had completed planned antibiotics. -has started Atezolizumab 06/16/2016 no prohibitive toxicities from this. Continue q3 weeks. -on palliative RT -is needing Home O2 2-3L/min. -continue Xgeva q4weeks -will tranfuse 2 units of PRBC's tomorrow for symptomatic anemia. -has been using ativan prn for anxiety.  #3 recent GI bleeding due to small bowel AVM. No overt bleeding at this time. Hemoglobin stable. Plan -patient had follow-up at [facility] for consideration of deep enteroscopy to try to ablate his small bowel AVM - he  was recommended against this. -no further overt GI bleeding noted at this time.  #4 pulmonary embolism related to metastatic malignancy. -switched from lovenox to Eliquis as per patient choice. Currently continues to be on Eliquis. -if SOB worsens out of proportion to lung findings might  need rpt CTA chest  #6  bony metastases- rt shoulder pain resolved with Delton See and RT and pain mx #7 adrenal metastases-no evidence of adrenal insufficiency at this time. Mets stable. Plan -continue Xgeva for skeletal metastases -completed palliative radiation to the right shoulder. Getting palliative RT to T10 - on OxyContin 10 mg by mouth now only at night time and continue oxycodone when necessary for breakthrough pain.  #8 protein calorie malnutrition  -Encouraged to maintain small frequent meals. Poor appetite currently.  #9 fatigue and depression -Optimize nutrition -Try to ambulate at least 20 minutes daily . -Remeron has helped with mood and appetite -- will continue this.  PRBC transfusion x 2 units tomorrow. RTC with Dr Irene Limbo on 07/01/2016 with labs. Earlier if increasing shortness of breath.  I spent 30 minutes counseling the patient face to face. The total time spent in the appointment was 35 minutes and more than 50% was on counseling and direct patient cares.    Sullivan Lone MD [place] AAHIVMS [facility] Hematology/Oncology Physician [facility]  (Office):       [phone] (Work cell):  [phone] (Fax):           [phone]

## 2016-06-23 ENCOUNTER — Ambulatory Visit: Payer: Medicare Other

## 2016-06-23 ENCOUNTER — Telehealth: Payer: Self-pay | Admitting: Oncology

## 2016-06-23 ENCOUNTER — Encounter: Payer: Self-pay | Admitting: Hematology

## 2016-06-23 DIAGNOSIS — J9601 Acute respiratory failure with hypoxia: Secondary | ICD-10-CM

## 2016-06-23 DIAGNOSIS — C3492 Malignant neoplasm of unspecified part of left bronchus or lung: Secondary | ICD-10-CM

## 2016-06-23 DIAGNOSIS — J910 Malignant pleural effusion: Secondary | ICD-10-CM

## 2016-06-23 DIAGNOSIS — I898 Other specified noninfective disorders of lymphatic vessels and lymph nodes: Secondary | ICD-10-CM

## 2016-06-23 DIAGNOSIS — E46 Unspecified protein-calorie malnutrition: Secondary | ICD-10-CM

## 2016-06-23 DIAGNOSIS — Z515 Encounter for palliative care: Secondary | ICD-10-CM

## 2016-06-23 DIAGNOSIS — F329 Major depressive disorder, single episode, unspecified: Secondary | ICD-10-CM

## 2016-06-23 DIAGNOSIS — C7951 Secondary malignant neoplasm of bone: Secondary | ICD-10-CM

## 2016-06-23 DIAGNOSIS — C7970 Secondary malignant neoplasm of unspecified adrenal gland: Secondary | ICD-10-CM

## 2016-06-23 LAB — TYPE AND SCREEN
ABO/RH(D): A NEG
ANTIBODY SCREEN: NEGATIVE
UNIT DIVISION: 0
Unit division: 0

## 2016-06-23 MED ORDER — MORPHINE SULFATE (PF) 2 MG/ML IV SOLN
INTRAVENOUS | Status: AC
  Filled 2016-06-23: qty 1

## 2016-06-23 MED ORDER — LEVOFLOXACIN 500 MG PO TABS
500.0000 mg | ORAL_TABLET | Freq: Every day | ORAL | Status: DC
  Administered 2016-06-23: 500 mg via ORAL
  Filled 2016-06-23 (×2): qty 1

## 2016-06-23 MED ORDER — MORPHINE SULFATE (CONCENTRATE) 10 MG/0.5ML PO SOLN
10.0000 mg | ORAL | Status: DC
  Administered 2016-06-23 (×3): 10 mg via ORAL
  Filled 2016-06-23 (×4): qty 0.5

## 2016-06-23 MED ORDER — MORPHINE SULFATE (PF) 2 MG/ML IV SOLN
2.0000 mg | INTRAVENOUS | Status: DC | PRN
  Administered 2016-06-24: 2 mg via INTRAVENOUS
  Administered 2016-06-24: 4 mg via INTRAVENOUS
  Filled 2016-06-23: qty 2
  Filled 2016-06-23: qty 1

## 2016-06-23 MED ORDER — ALBUTEROL SULFATE (2.5 MG/3ML) 0.083% IN NEBU
2.5000 mg | INHALATION_SOLUTION | RESPIRATORY_TRACT | Status: DC | PRN
  Administered 2016-06-23: 2.5 mg via RESPIRATORY_TRACT
  Filled 2016-06-23: qty 3

## 2016-06-23 MED ORDER — MORPHINE SULFATE (PF) 2 MG/ML IV SOLN: 1.0000 mg | INTRAVENOUS | Status: DC

## 2016-06-23 MED ORDER — MORPHINE SULFATE (PF) 2 MG/ML IV SOLN
2.0000 mg | INTRAVENOUS | Status: AC
  Administered 2016-06-23: 2 mg via INTRAVENOUS

## 2016-06-23 MED ORDER — DEXAMETHASONE SODIUM PHOSPHATE 10 MG/ML IJ SOLN
10.0000 mg | INTRAMUSCULAR | Status: DC
  Administered 2016-06-23: 10 mg via INTRAVENOUS
  Filled 2016-06-23: qty 1

## 2016-06-23 MED ORDER — IPRATROPIUM-ALBUTEROL 0.5-2.5 (3) MG/3ML IN SOLN
3.0000 mL | Freq: Four times a day (QID) | RESPIRATORY_TRACT | Status: DC
  Administered 2016-06-23 (×2): 3 mL via RESPIRATORY_TRACT
  Filled 2016-06-23 (×2): qty 3

## 2016-06-23 MED ORDER — METOPROLOL TARTRATE 25 MG PO TABS: 25.0000 mg | ORAL_TABLET | Freq: Two times a day (BID) | ORAL | Status: DC

## 2016-06-23 MED ORDER — HYDROCOD POLST-CPM POLST ER 10-8 MG/5ML PO SUER
5.0000 mL | Freq: Two times a day (BID) | ORAL | Status: DC
  Administered 2016-06-23 (×2): 5 mL via ORAL
  Filled 2016-06-23 (×2): qty 5

## 2016-06-23 MED ORDER — LORAZEPAM 2 MG/ML IJ SOLN
0.5000 mg | INTRAMUSCULAR | Status: DC | PRN
  Administered 2016-06-23 – 2016-06-24 (×4): 0.5 mg via INTRAVENOUS
  Filled 2016-06-23 (×4): qty 1

## 2016-06-23 MED ORDER — MORPHINE SULFATE (PF) 2 MG/ML IV SOLN
2.0000 mg | Freq: Once | INTRAVENOUS | Status: AC
  Administered 2016-06-23: 2 mg via INTRAVENOUS

## 2016-06-23 MED ORDER — BENZONATATE 100 MG PO CAPS
200.0000 mg | ORAL_CAPSULE | Freq: Three times a day (TID) | ORAL | Status: DC
  Administered 2016-06-23 (×2): 200 mg via ORAL
  Filled 2016-06-23 (×2): qty 2

## 2016-06-23 MED ORDER — MORPHINE SULFATE (PF) 2 MG/ML IV SOLN
2.0000 mg | INTRAVENOUS | Status: DC
  Administered 2016-06-23: 2 mg via INTRAVENOUS
  Filled 2016-06-23: qty 1

## 2016-06-23 MED ORDER — CLONAZEPAM 0.5 MG PO TBDP
0.5000 mg | ORAL_TABLET | Freq: Two times a day (BID) | ORAL | Status: DC
  Administered 2016-06-23 (×2): 0.5 mg via ORAL
  Filled 2016-06-23 (×2): qty 1

## 2016-06-23 NOTE — Progress Notes (Signed)
   06/23/16 1200  Clinical Encounter Type  Visited With Patient and family together  Visit Type Initial;Psychological support;Spiritual support;Critical Care  Referral From Nurse  Consult/Referral To Chaplain  Spiritual Encounters  Spiritual Needs Emotional;Other (Comment) (Pastoral Conversation/Support)  Stress Factors  Patient Stress Factors Other (Comment) (Hungry )  Family Stress Factors Health changes;Major life changes   I visited with the patient and his family per request by the nurse.  I introduced myself and Spiritual Care services.    Please, contact Spiritual Care for further assistance.   [place] M.Div.

## 2016-06-23 NOTE — Clinical Social Work Note (Signed)
Clinical Social Work Assessment  Patient Details  Name: Tim Ross MRN: [id number] Date of Birth: April 23, 1935  Date of referral:  06/23/16               Reason for consult:  End of Life/Hospice                Permission sought to share information with:  Family Supports Permission granted to share information::     Name::     [place]::  Hospice Home  Relationship::  Spouse  Contact Information:  [phone]  Housing/Transportation Living arrangements for the past 2 months:  Single Family Home Source of Information:  Spouse Patient Interpreter Needed:  None Criminal Activity/Legal Involvement Pertinent to Current Situation/Hospitalization:  No - Comment as needed Significant Relationships:  Spouse Lives with:  Spouse Do you feel safe going back to the place where you live?  No Need for family participation in patient care:  Yes (Comment)  Care giving concerns:  Residential Hospice Placement.    Social Worker assessment / plan: LCSWA met with patient spouse at bedside, explained role and reason for consult-assist with Hospice placement. LCSWA informed family about Hospice referral process.  Patient family is very concerned patient will not have a bed at [facility] and spouse is able to drive long distances due to her own health issues. LCSWA explained bed availbility at [facility] changes daily.  LCSWA called [facility] reports as of 2/5 they do not have any beds but took patient information to determine if patient is Hospice appropriate. She plans to follow up with CSW.  LCSWA will continue to assist with residential hospice placement.   Employment status:  Retired Forensic scientist:  Medicare PT Recommendations:  Not assessed at this time Information / Referral to community resources:     Patient/Family's Response to care:  Agreeable to [employer].   Patient/Family's Understanding of and Emotional Response to Diagnosis,  Current Treatment, and Prognosis:  Spouse at bedside, aware of patient current diagnosis and need for Hospice placement.   Emotional Assessment Appearance:    Attitude/Demeanor/Rapport:    Affect (typically observed):    Orientation:  Oriented to Self Alcohol / Substance use:  Not Applicable Psych involvement (Current and /or in the community):  No (Comment)  Discharge Needs  Concerns to be addressed:  Discharge Planning Concerns Readmission within the last 30 days:  No Current discharge risk:  None Barriers to Discharge:  Hospice Bed not available   Lia Hopping, LCSW 06/23/2016, 3:09 PM

## 2016-06-23 NOTE — Progress Notes (Signed)
Date:  June 23, 2016 Chart reviewed for concurrent status and case management needs. Will continue to follow patient progress. Discharge Planning: following for needs Expected discharge date: [id number] Rhonda Davis, BSN, RN3, CCM   [phone] 

## 2016-06-23 NOTE — Progress Notes (Signed)
[facility] RN update  Received request from [place] for family interest in [facility]. Contacted daughter Kenney Houseman and wife Olegario Shearer to confirm interest and explain services then made aware that no bed availability today.  [facility] will touch base with wife in morning to follow up about possible bed availability.  Thank you for the referral. Please contact [facility] with any updates or questions as needed at [phone].   Gar Ponto, RN  [facility] schedule and contact numbers on Amion.

## 2016-06-23 NOTE — Progress Notes (Signed)
[facility] PROGRESS NOTE    Progress Note  Tim Ross  CHE:[id number] DOB: 1935-05-11 DOA: 06/21/2016 PCP: Garret Reddish, MD     Brief Narrative:   Tim Ross is an 82 y.o. 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 Start on HCAP treatment.  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 - has used Bi-pap and non-rebreathe, with minimal improvement in saturation. Use morphine for SOB - Continue with IV vancomycin and cefepime, Mucinex, and as needed -  Ct chest with contrast progression of disease with lymphatici spread. - pt would like to move toward the comfort route. He expressed wanting to be DNR/DNI, he also want to be comfortable and try to pass at home if possible. - discontinue Abx and other meds not relates to comfort. - Transfer to regular for with focus on comfort care. - prognosis is days.  Elevated troponins - Most likely demand ischemia due to infection, anemia and hypoxia. - will try to keep HR < 100.  Hx of PE: D/c apixiban.  Depression - D/cmirtazapine  CKD 3 - stable  Normocytic Anemia Malignancy related pain/  Bone metastases [facility]): Sinus tach:   DVT prophylaxis: [place] Communication:son Disposition Plan/Barrier to D/C: once respiratory status improved. Code Status:     Code Status Orders        Start     Ordered   06/21/16 1534  Full code  Continuous     06/21/16 1533    Code Status History    Date Active Date Inactive Code Status  Order ID Comments User Context   06/08/2016  3:33 PM 06/13/2016  6:32 PM Full Code [id number]  Tawni Millers, MD ED   03/09/2016  6:46 PM 03/16/2016  7:06 PM Full Code [id number]  Florencia Reasons, MD Inpatient        IV Access:    Peripheral IV   Procedures and diagnostic studies:   Ct Chest W Contrast  Result Date: 06/22/2016 CLINICAL DATA:  Shortness of breath. Infection versus lymphangitic spread of tumor. EXAM: CT CHEST WITH CONTRAST TECHNIQUE: Multidetector CT imaging of the chest was performed during intravenous contrast administration. CONTRAST:  1 ISOVUE-300 IOPAMIDOL (ISOVUE-300) INJECTION 61% COMPARISON:  Multiple recent chest x-rays.  CT scan May 27, 2016 FINDINGS: Cardiovascular: The thoracic aorta demonstrates no aneurysm or dissection. The left-sided tumor encases the left upper lobe pulmonary artery and the proximal lower lobe pulmonary arteries. No obvious central emboli seen on today's limited evaluation. The heart is unchanged. Mediastinum/Nodes: Adenopathy in the mediastinum and hila is worsened in the interval. The superior mediastinal node on series 2, image 43 measures 15 by 11 mm today versus 12 by 10 mm previously. A pretracheal node on image 58 measures 12 by 15 mm today versus 9 by 10 mm previously. A node anterior to the left main pulmonary artery measures 16 mm today versus 12 mm previously. The subcarinal node measures 2.3 cm in short axis today versus 1.8 cm previously. There is likely involvement of paraesophageal nodes. Nodes in the gastrohepatic ligament are larger in the interval, likely  representing further involvement. Bilateral pleural effusions are seen. No significant pericardial effusion. The esophagus itself is unremarkable. Lungs/Pleura: The trachea and right-sided airways are normal. There is narrowing of the left upper lobe airways. There is bronchial wall thickening in the left lower lobe. No pneumothorax. Emphysematous changes seen in the lungs. The  consolidation in the left upper lobe has markedly worsened. The suspected tumor now measures 12 x 9 cm today versus 6 x 7 cm previously. Lymphangitic spread persists. Patchy opacity superior to the known tumor could represent a superimposed infectious process. The tumor extends along the left fissure and along the left lower lobe bronchovascular bundle. More patchy opacity in the left base could represent superimposed infection. There is probable lymphangitic spread in the left lower lobe as well. There is scarring in the left apex. The 10 mm nodule seen in the lateral right lung previously is smaller in the interval, largely represented by ground-glass today. A few other tiny nodules in the right lung are stable. No entirely new nodules on the right. Bilateral small effusions with associated atelectasis. Upper Abdomen: The left adrenal mass measures 3.7 cm today versus 3.3 cm previously. Shotty nodes in the gastrohepatic ligament with some increased attenuation of fat have worsened. A few other shotty nodes are seen in the upper abdomen. Low-attenuation in the spleen is consistent with splenic infarcts, not seen previously. No other acute abnormalities. Musculoskeletal: Known metastatic disease to the left humeral head. Sclerosis is in upper right rib is stable. The T10 lesion is stable in the interval. IMPRESSION: 1. The patient's primary tumor in the central left upper lobe is markedly larger in the interval. It now extends into the left lower lobe along the fissure and bronchovascular bundle. There is lymphangitic spread in both the upper and lower left lobes. Patchy opacities adjacent to tumor in the left lung could represent superimposed infection. 2. Worsening adenopathy in the mediastinum and hila. 3. Probable developing metastatic adenopathy in the upper abdomen. 4. The left adrenal mass measures a little larger in the interval. The previous PET-CT suggested a collision metastatic lesion. 5. Splenic  infarcts, new in the interval. 6. Persistent metastatic disease in the right humeral head and T10 vertebral body. 7. The 10 mm nodule in the right lung has decreased in size. These results will be called to the ordering clinician or representative by the Radiologist Assistant, and communication documented in the PACS or zVision Dashboard. Electronically Signed   By: Dorise Bullion III M.D   On: 06/22/2016 16:34   Dg Chest Port 1 View  Result Date: 06/21/2016 CLINICAL DATA:  Dyspnea for 1 day. EXAM: PORTABLE CHEST 1 VIEW COMPARISON:  06/21/2016 FINDINGS: Persistent consolidation in the left upper lobe, unchanged. Patchy opacities in the left base, also unchanged. Chronic blunting of the left lateral costophrenic angle. Possible 5 mm nodule in the right apex K Right lung is otherwise clear. Pulmonary vasculature is normal. IMPRESSION: Persistent confluent consolidation in the left upper lobe. Persistent patchy opacities in the left base. Possible new nodule in the right apex. Electronically Signed   By: Andreas Newport M.D.   On: 06/21/2016 19:57   Dg Chest Port 1 View  Result Date: 06/21/2016 CLINICAL DATA:  82 year old male with shortness of breath and cough. Stage IV lung cancer. Initial encounter. EXAM: PORTABLE CHEST 1 VIEW COMPARISON:  06/14/2016 and earlier. FINDINGS: Portable AP semi upright view at at 0810 hours. Increased confluence and density of diffuse left mid and upper lung opacity which was  previously thought to represent lung cancer with lymphangitic carcinomatosis. Small left pleural effusion is stable. No superimposed pneumothorax or pulmonary edema. Stable and negative right lung. Stable cardiac size and mediastinal contours. Visualized tracheal air column is within normal limits. IMPRESSION: 1. Progressed density of the left mid and upper lung. 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 2. Small left pleural effusion is  stable. Electronically Signed   By: Genevie Ann M.D.   On: 06/21/2016 08:30     Medical Consultants:    None.  Anti-Infectives:   IV vanc and cefepime  Subjective:    Mcihael A Dahan he relates his breathing is worst.  Objective:    Vitals:   06/23/16 0400 06/23/16 0432 06/23/16 0500 06/23/16 0600  BP: 133/66 133/66  139/67  Pulse:  (!) 112    Resp: '20 19 18 '$ (!) 22  Temp:      TempSrc:      SpO2: 96% 97% 97% 96%  Weight:      Height:        Intake/Output Summary (Last 24 hours) at 06/23/16 0758 Last data filed at 06/23/16 0700  Gross per 24 hour  Intake             2225 ml  Output              365 ml  Net             1860 ml   Filed Weights   06/21/16 0740 06/21/16 1522  Weight: 86.2 kg (190 lb) 87 kg (191 lb 12.8 oz)    Exam: General exam: In no acute distress. Respiratory system: decrease air movement and crackles on the left lung, using accessory muscle to breath. Cardiovascular system: S1 & S2 heard, RRR. No JVD. Gastrointestinal system: Abdomen is nondistended, soft and nontender.  Extremities: No pedal edema. Skin: No rashes, lesions or ulcers Psychiatry: Judgement and insight appear normal. Mood & affect appropriate.    Data Reviewed:    Labs: Basic Metabolic Panel:  Recent Labs Lab 06/16/16 1417 06/20/16 1359 06/21/16 0858 06/22/16 0350  NA 135* 138 137 137  K 4.7 4.9 4.6 4.4  CL  --   --  106 110  CO2 21* '24 22 22  '$ GLUCOSE 110 120 132* 122*  BUN 30.2* 35.9* 34* 27*  CREATININE 1.3 1.2 1.39* 1.24  CALCIUM 8.4 8.4 7.5* 6.8*   GFR Estimated Creatinine Clearance: 54.6 mL/min (by C-G formula based on SCr of 1.24 mg/dL). Liver Function Tests:  Recent Labs Lab 06/16/16 1417 06/20/16 1359  AST 64* 65*  ALT 55 44  ALKPHOS 71 69  BILITOT 0.25 0.35  PROT 6.2* 6.0*  ALBUMIN <2.0* <2.0*   No results for input(s): LIPASE, AMYLASE in the last 168 hours. No results for input(s): AMMONIA in the last 168 hours. Coagulation profile No  results for input(s): INR, PROTIME in the last 168 hours.  CBC:  Recent Labs Lab 06/16/16 1417 06/20/16 1359 06/21/16 0858 06/22/16 0350  WBC 16.8* 13.6* 11.8* 11.5*  NEUTROABS 13.2* 11.3*  --   --   HGB 8.8* 8.7* 8.1* 9.1*  HCT 27.9* 26.9* 25.4* 28.3*  MCV 93.9 94.0 94.4 91.9  PLT 225 292 202 168   Cardiac Enzymes:  Recent Labs Lab 06/21/16 1644 06/21/16 2135 06/22/16 0350  TROPONINI 0.08* 0.13* 0.11*   BNP (last 3 results)  Recent Labs  02/27/16 1031  PROBNP 37.0   CBG: No results for  input(s): GLUCAP in the last 168 hours. D-Dimer: No results for input(s): DDIMER in the last 72 hours. Hgb A1c: No results for input(s): HGBA1C in the last 72 hours. Lipid Profile: No results for input(s): CHOL, HDL, LDLCALC, TRIG, CHOLHDL, LDLDIRECT in the last 72 hours. Thyroid function studies: No results for input(s): TSH, T4TOTAL, T3FREE, THYROIDAB in the last 72 hours.  Invalid input(s): FREET3 Anemia work up: No results for input(s): VITAMINB12, FOLATE, FERRITIN, TIBC, IRON, RETICCTPCT in the last 72 hours. Sepsis Labs:  Recent Labs Lab 06/16/16 1417 06/20/16 1359 06/21/16 0858 06/22/16 0350  WBC 16.8* 13.6* 11.8* 11.5*   Microbiology Recent Results (from the past 240 hour(s))  Culture, blood (Routine X 2) w Reflex to ID Panel     Status: None (Preliminary result)   Collection Time: 06/21/16 11:23 AM  Result Value Ref Range Status   Specimen Description BLOOD BLOOD LEFT FOREARM  Final   Special Requests BOTTLES DRAWN AEROBIC AND ANAEROBIC 5 CC EACH  Final   Culture   Final    NO GROWTH < 24 HOURS Performed at [facility], [place] [address], [place], [place] [address]    Report Status PENDING  Incomplete  Culture, blood (Routine X 2) w Reflex to ID Panel     Status: None (Preliminary result)   Collection Time: 06/21/16 11:23 AM  Result Value Ref Range Status   Specimen Description BLOOD RIGHT HAND  Final   Special Requests IN PEDIATRIC BOTTLE 4CC   Final   Culture   Final    NO GROWTH < 24 HOURS Performed at [facility], [place] [facility] [place] [address] [place], [place] [address]    Report Status PENDING  Incomplete  MRSA PCR Screening     Status: None   Collection Time: 06/21/16  3:49 PM  Result Value Ref Range Status   MRSA by PCR NEGATIVE NEGATIVE Final    Comment:        The GeneXpert MRSA Assay (FDA approved for NASAL specimens only), is one component of a comprehensive MRSA colonization surveillance program. It is not intended to diagnose MRSA infection nor to guide or monitor treatment for MRSA infections.   Culture, sputum-assessment     Status: None   Collection Time: 06/21/16  8:54 PM  Result Value Ref Range Status   Specimen Description SPUTUM  Final   Special Requests NONE  Final   Sputum evaluation THIS SPECIMEN IS ACCEPTABLE FOR SPUTUM CULTURE  Final   Report Status 06/21/2016 FINAL  Final  Culture, respiratory (NON-Expectorated)     Status: None (Preliminary result)   Collection Time: 06/21/16  8:54 PM  Result Value Ref Range Status   Specimen Description SPUTUM  Final   Special Requests NONE Reflexed from [id number]  Final   Gram Stain   Final    FEW WBC PRESENT, PREDOMINANTLY PMN MODERATE GRAM NEGATIVE RODS FEW GRAM POSITIVE COCCI IN PAIRS FEW FUNGAL ELEMENTS SEEN Performed at [facility], [place] [address] [place], [place] [address]    Culture PENDING  Incomplete   Report Status PENDING  Incomplete     Medications:   . apixaban  5 mg Oral BID  . ceFEPime (MAXIPIME) IV  1 g Intravenous Q12H  . feeding supplement (ENSURE ENLIVE)  237 mL Oral BID BM  . folic acid  1 mg Oral Daily  . guaiFENesin  1,200 mg Oral BID  . mouth rinse  15 mL Mouth Rinse BID  . metoprolol tartrate  12.5  mg Oral BID  . mirtazapine  15 mg Oral QHS  . multivitamin  1 tablet Oral BID  . multivitamin with minerals  1 tablet Oral Daily  . oxyCODONE  10 mg Oral Q12H  . pantoprazole  40 mg Oral Daily  . polyethylene glycol  17 g  Oral Daily  . senna-docusate  2 tablet Oral BID  . vancomycin  750 mg Intravenous Q12H   Continuous Infusions: . sodium chloride 75 mL/hr at 06/23/16 0600    Time spent: 35 min   LOS: 2 days   Charlynne Cousins  [facility] Pager [phone]  *Please refer to [email or web address], password [email or web address] to get updated schedule on who will round on this patient, as hospitalists switch teams weekly. If 7PM-7AM, please contact night-coverage at [email or web address], password [email or web address] for any overnight needs.  06/23/2016, 7:58 AM

## 2016-06-23 NOTE — Telephone Encounter (Addendum)
Talked to Claborn's nurse in ICU.  He is currently receiving comfort care and does not want radiation.  Notified therapists on [place] 1.

## 2016-06-23 NOTE — Progress Notes (Signed)
Auth for Oxycontin was denied.  Gave denial to the nurse.

## 2016-06-23 NOTE — Consult Note (Signed)
Consultation Note Date: 06/23/2016   Patient Name: Tim Ross  DOB: [birth year]  MRN: [id number]  Age / Sex: 82 y.o., male  PCP: Tim Olp, MD Referring Physician: Charlynne Cousins, MD  Reason for Consultation: Establishing goals of care, Non pain symptom management, Pain control and Psychosocial/spiritual support  HPI/Patient Profile: 82 y.o. retired Scientist, physiological at [employer] and beloved "Tim Ross" with metastatic NSCLC admitted on 06/21/2016 with rapid disease progression despite chemotherapy. Now has lymphangitic spread, opacified LUL and vasculature involved LLL, probable super imposed post obstructive PNA.   Clinical Assessment and Goals of Care:  I met with the patient, his wife, his daughter, 2 grandaughters and his church pastor. Tim Ross tells me today "I cannot go on like this". He is tearful and slightly groggy from a recent Ativan dose. Clearly having discomfort and weak. Family and patient are experiencing grief over his current condition and prognosis. CT scan from 2/4 shows wide spread disease progression. Patient tells me that he does not want any additional aggressive treatments- he wants to feel better if possible. When I asked him what mattered to him he said he was worried about his family.   Patient and Family: 1. To take things one day at a time. 2. To control symptoms the best we can without over sedation, but if sedation required for comfort this is ok. 3. Move out of the ICU to a more comfortable environment 4. If possible to feel better he wants all treatments that will help with this.   Tim Ross is progressing quickly towards EOL, with better symptom management and a comfort focus I hope we can get him some quality-I also discussed with family that while our goal is to keep him comfortable in some situations sedation is required.   SUMMARY OF RECOMMENDATIONS       He sounds like he has airway obstruction, I think decadron will help with this as well as help with surrounding adenopathy.  For anxiety I would recommend Klonopin low dose RDT BID and PRN IV ativan  For pain and dyspnea will schedule Morphine 67m q4 hours, IV morphine PRN, titrate as needed for his comfort  Remove full face mask and use nasal cannula O2 if tolerated  In this case, if he can take oral antibiotic for the post-obstructive PNA it would be reasonable, but if he becomes unresponsive or is progressing towards active EOL these should be held. Levaquin 5026mdaily.   We discussed [place] [facility] and [place] [facility] [place] patient shortly after my consult. Family appreciative of his care.  Palliative Prophylaxis:   Aspiration, Bowel Regimen and Frequent Pain Assessment  Additional Recommendations (Limitations, Scope, Preferences):  Full Comfort Care  Psycho-social/Spiritual:   Desire for further Chaplaincy support:no  Additional Recommendations: Education on Hospice  Prognosis:   < 2 weeks  Discharge Planning: Hospice facility      Primary Diagnoses: Present on Admission: . HCAP (healthcare-associated pneumonia) . [place]. Adenocarcinoma of  left lung, stage 4 ([place]) . Bone metastases ([place]) . DVT of lower extremity, bilateral ([place]) . Essential hypertension . GERD . Hyperlipidemia . Neoplasm related pain   I have reviewed the medical record, interviewed the patient and family, and examined the patient. The following aspects are pertinent.  Past Medical History:  Diagnosis Date  . Cancer ([place])   . Colonic polyp   . GERD (gastroesophageal reflux disease)   . History of radiation therapy 04/07/16-04/23/16   right humerus 30 Gy in 10 fractions  . Hyperlipidemia   . Hypertension   . Muscular degeneration   . Pneumonia 2002  . Testicular cyst    removed-noncancerous   Social History   Social History  .  Marital status: Married    Spouse name: N/A  . Number of children: 4  . Years of education: N/A   Occupational History  . worked at [employer]  . Smoking status: Former Smoker    Packs/day: 1.50    Years: 40.00    Types: Cigarettes    Quit date: 12/30/1990  . Smokeless tobacco: Never Used  . Alcohol use No  . Drug use: No  . Sexual activity: Not Asked   Other Topics Concern  . None   Social History Narrative   Married (2nd marriage-79). No children. Dog.       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       Hobbies: tv, walks daily or every other day   Family History  Problem Relation Age of Onset  . Diabetes Mother   . Hypertension Mother   . Heart attack Father     late 70s, former smoker  . Heart disease Brother     14s, smokers  . Colon cancer Sister    Scheduled Meds: . benzonatate  200 mg Oral TID  . chlorpheniramine-HYDROcodone  5 mL Oral Q12H  . clonazepam  0.5 mg Oral BID  . dexamethasone  10 mg Intravenous Q24H  . ipratropium-albuterol  3 mL Nebulization Q6H  . levofloxacin  500 mg Oral Daily  . mirtazapine  15 mg Oral QHS  . morphine CONCENTRATE  10 mg Oral Q4H   Continuous Infusions: PRN Meds:.albuterol, bisacodyl, LORazepam, morphine injection Medications Prior to Admission:  Prior to Admission medications   Medication Sig Start Date End Date Taking? Authorizing Provider  apixaban (ELIQUIS) 5 MG TABS tablet Take 1 tablet (5 mg total) by mouth 2 (two) times daily. Start tonight 06/13/16  Yes Debbe Odea, MD  benzonatate (TESSALON) 200 MG capsule Take 1 capsule (200 mg total) by mouth 3 (three) times daily as needed for cough. 06/13/16  Yes Debbe Odea, MD  chlorpheniramine-HYDROcodone (TUSSIONEX) 10-8 MG/5ML SUER Take 5 mLs by mouth every 12 (twelve) hours as needed for cough. 06/13/16  Yes Debbe Odea, MD  CINNAMON PO Take 2 tablets by mouth every morning.    Yes Historical Provider, MD  emollient  (BIAFINE) cream Apply topically 2 (two) times daily.   Yes Historical Provider, MD  folic acid (FOLVITE) 1 MG tablet TK 1 T PO D.START 5-7 DAYS BEFORE ALIMTA CHEMOTHERAPY.CONTINUE UNTIL 21 DAYS AFTER ALIMTA COMPLETED 03/27/16  Yes Historical Provider, MD  LORazepam (ATIVAN) 0.5 MG tablet Take 1 tablet (0.5 mg total) by mouth every 8 (eight) hours as needed for anxiety. 06/16/16  Yes Brunetta Genera, MD  mirtazapine (REMERON) 15 MG tablet Take 1 tablet (15 mg total) by mouth at bedtime. 04/14/16  Yes Brunetta Genera, MD  Multiple Vitamin (MULTIVITAMIN WITH MINERALS) TABS tablet Take 1 tablet by mouth every morning.    Yes Historical Provider, MD  Multiple Vitamins-Minerals (ICAPS) CAPS Take 1 capsule by mouth 2 (two) times daily.   Yes Historical Provider, MD  Omega-3 Fatty Acids (FISH OIL PO) Take 1 capsule by mouth every morning.    Yes Historical Provider, MD  omeprazole (PRILOSEC) 20 MG capsule Take 1 capsule (20 mg total) by mouth daily. 03/16/16  Yes Clanford Marisa Hua, MD  ondansetron (ZOFRAN) 8 MG tablet Take 1 tablet (8 mg total) by mouth 2 (two) times daily as needed (Nausea or vomiting). 06/08/16  Yes Brunetta Genera, MD  oxyCODONE (OXYCONTIN) 10 mg 12 hr tablet Take 1 tablet (10 mg total) by mouth every 12 (twelve) hours as needed. 06/16/16  Yes Gautam Juleen China, MD  oxyCODONE (ROXICODONE) 5 MG immediate release tablet Take 1-2 tablets (5-10 mg total) by mouth every 4 (four) hours as needed for moderate pain, severe pain or breakthrough pain. 06/13/16  Yes Debbe Odea, MD  polyethylene glycol (MIRALAX) packet Take 17 g by mouth daily. 03/27/16  Yes Gautam Juleen China, MD  prochlorperazine (COMPAZINE) 10 MG tablet Take 1 tablet (10 mg total) by mouth every 6 (six) hours as needed for nausea or vomiting. 04/03/16  Yes Brunetta Genera, MD  senna-docusate (SENOKOT-S) 8.6-50 MG tablet Take 2 tablets by mouth 2 (two) times daily. 03/27/16  Yes Brunetta Genera, MD  feeding  supplement, ENSURE ENLIVE, (ENSURE ENLIVE) LIQD Take 237 mLs by mouth 2 (two) times daily between meals. 03/16/16   Clanford Marisa Hua, MD  levofloxacin (LEVAQUIN) 750 MG tablet Take 1 tablet (750 mg total) by mouth daily. Patient not taking: Reported on 06/21/2016 06/13/16   Debbe Odea, MD  prochlorperazine (COMPAZINE) 10 MG tablet Take 1 tablet (10 mg total) by mouth every 6 (six) hours as needed (Nausea or vomiting). 06/08/16   Brunetta Genera, MD   Allergies  Allergen Reactions  . Celebrex [Celecoxib] Hives   Review of Systems  Physical Exam  Vital Signs: BP (!) 156/74 (BP Location: Left Arm)   Pulse (!) 112   Temp 98.4 F (36.9 C) (Oral)   Resp (!) 28   Ht _0  (1.854 m)   Wt 87 kg (191 lb 12.8 oz)   SpO2 95%   BMI 25.30 kg/m  Pain Assessment: 0-10 POSS *See Group Information*: 1-Acceptable,Awake and alert Pain Score: 0-No pain   SpO2: SpO2: 95 % O2 Device:SpO2: 95 % O2 Flow Rate: .O2 Flow Rate (L/min): 15 L/min  IO: Intake/output summary:  Intake/Output Summary (Last 24 hours) at 06/23/16 1155 Last data filed at 06/23/16 0900  Gross per 24 hour  Intake           2772.5 ml  Output              365 ml  Net           2407.5 ml    LBM: Last BM Date: 06/22/16 Baseline Weight: Weight: 86.2 kg (190 lb) Most recent weight: Weight: 87 kg (191 lb 12.8 oz)     Palliative Assessment/Data:   Flowsheet Rows   Flowsheet Row Most Recent Value  Intake Tab  Referral Department  Hospitalist  Unit at Time of Referral  ICU  Palliative Care Primary Diagnosis  Cancer  Date Notified  06/22/16  Palliative Care Type  New Palliative care  Reason for referral  Clarify Goals  of Care  Date of Admission  06/21/16  Date first seen by Palliative Care  06/23/16  # of days IP prior to Palliative referral  1  Clinical Assessment  Palliative Performance Scale Score  20%  Pain Max last 24 hours  8  Pain Min Last 24 hours  8  Dyspnea Max Last 24 Hours  10  Dyspnea Min Last 24 hours   8  Psychosocial & Spiritual Assessment  Palliative Care Outcomes  Palliative Care Outcomes  Improved pain interventions, Counseled regarding hospice, Improved non-pain symptom therapy, Clarified goals of care, Changed to focus on comfort      Time In: 10:30 Time Out: 11:40 Time Total: 80 min Greater than 50%  of this time was spent counseling and coordinating care related to the above assessment and plan.  Signed by: Lane Hacker, DO   Please contact Palliative Medicine Team phone at [phone] for questions and concerns.  For individual provider: See Shea Evans

## 2016-06-24 ENCOUNTER — Telehealth: Payer: Self-pay

## 2016-06-24 ENCOUNTER — Ambulatory Visit: Admission: RE | Admit: 2016-06-24 | Payer: Medicare Other | Source: Ambulatory Visit | Admitting: Radiation Oncology

## 2016-06-24 ENCOUNTER — Ambulatory Visit: Payer: Medicare Other

## 2016-06-24 DIAGNOSIS — J9601 Acute respiratory failure with hypoxia: Secondary | ICD-10-CM

## 2016-06-24 LAB — CULTURE, RESPIRATORY

## 2016-06-24 LAB — CULTURE, RESPIRATORY W GRAM STAIN: Culture: NORMAL

## 2016-06-24 MED ORDER — IPRATROPIUM-ALBUTEROL 0.5-2.5 (3) MG/3ML IN SOLN
3.0000 mL | Freq: Four times a day (QID) | RESPIRATORY_TRACT | Status: DC
  Administered 2016-06-24: 3 mL via RESPIRATORY_TRACT
  Filled 2016-06-24: qty 3

## 2016-06-24 MED ORDER — MORPHINE SULFATE (CONCENTRATE) 10 MG/0.5ML PO SOLN: 10.0000 mg | ORAL | Status: AC

## 2016-06-24 MED ORDER — CLONAZEPAM 0.5 MG PO TBDP: 0.5000 mg | ORAL_TABLET | Freq: Two times a day (BID) | ORAL | 0 refills | Status: AC

## 2016-06-24 NOTE — Consult Note (Signed)
[facility] available today. Paper work completed with spouse and daughter this morning. CSW [place] aware.   Please fax discharge summary to [phone].  RN please call report to [phone].  Thank you, Erling Conte, LCSW [phone]

## 2016-06-24 NOTE — Discharge Summary (Addendum)
Physician Discharge Summary  Tim Ross JWJ:[id number] DOB: 20-Sep-1934 DOA: 06/21/2016  PCP: Garret Reddish, MD  Admit date: 06/21/2016 Discharge date: 06/24/2016  Admitted From: home Disposition:  Residential Hospice  Recommendations for Outpatient Follow-up:  1. Will go to residential  hospice for comfort care.  Home Health:No Equipment/Devices:None  Discharge Condition:guarde CODE STATUS:DNR Diet recommendation: Comfort feeds  Brief/Interim Summary: 82 y.o. 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  Discharge Diagnoses:  Active Problems:   Hyperlipidemia   Essential hypertension   GERD   Acute pulmonary embolism (HCC)   Neoplasm related pain   DVT of lower extremity, bilateral (HCC)   Adenocarcinoma of left lung, stage 4 (HCC)   Bone metastases ([place])   HCAP (healthcare-associated pneumonia)   Other specified noninfective disorders of lymphatic vessels and lymph nodes   Acute respiratory failure with hypoxia (HCC)  Acute on chronic hypoxic respiratory failure due to HCAP with lymphatic spread: - Patient on baseline 2 L nasal cannula at home in the hospital he was climbing BiPAP and a nonrebreather he was started empirically on IV antibiotics for healthcare associated pneumonia. - He was using Bi-pap and non-rebreathe, with minimal improvement in saturation. - CT scan of the chest showed worsening metastatic disease with lymphangitic spread. - Long discussion patient and family and they would like to move toward the comfort route. He expressed wanting to be DNR/DNI, he also want to be comfortable and try to pass at home if possible. - [facility] was consulted along with his  oncologist who did agree with current management to move towards comfort care. He will go to [facility].  Elevatedtroponins - Most likely demand ischemia due to infection, anemia and hypoxia.  Hx of PE: D/c apixiban.  Depression - D/cmirtazapine  CKD 3 - stable  Discharge Instructions  Discharge Instructions    Diet - low sodium heart healthy    Complete by:  As directed    Increase activity slowly    Complete by:  As directed      Allergies as of 06/24/2016      Reactions   Celebrex [celecoxib] Hives      Medication List    STOP taking these medications   apixaban 5 MG Tabs tablet Commonly known as:  ELIQUIS   benzonatate 200 MG capsule Commonly known as:  TESSALON   chlorpheniramine-HYDROcodone 10-8 MG/5ML Suer Commonly known as:  TUSSIONEX   CINNAMON PO   emollient cream Commonly known as:  BIAFINE   feeding supplement (ENSURE ENLIVE) Liqd   FISH OIL PO   folic acid 1 MG tablet Commonly known as:  FOLVITE   ICAPS Caps   levofloxacin 750 MG tablet Commonly known as:  LEVAQUIN   LORazepam 0.5 MG tablet Commonly known as:  ATIVAN   mirtazapine 15 MG tablet Commonly known as:  REMERON   multivitamin with minerals Tabs tablet   omeprazole 20 MG capsule Commonly known as:  PRILOSEC   ondansetron 8 MG tablet Commonly known as:  ZOFRAN   polyethylene glycol packet Commonly known as:  MIRALAX   prochlorperazine 10 MG tablet Commonly known as:  COMPAZINE   senna-docusate 8.6-50 MG tablet Commonly known as:  Senokot-S     TAKE these medications   clonazePAM 0.5  MG disintegrating tablet Commonly known as:  KLONOPIN Take 1 tablet (0.5 mg total) by mouth 2 (two) times daily.   morphine CONCENTRATE 10 MG/0.5ML Soln concentrated solution Take 0.5 mLs (10 mg total) by mouth every 4 (four) hours.   oxyCODONE 10 mg 12 hr tablet Commonly known as:  OXYCONTIN Take 1 tablet (10 mg total) by mouth every 12 (twelve) hours as needed. What  changed:  Another medication with the same name was removed. Continue taking this medication, and follow the directions you see here.       Allergies  Allergen Reactions  . Celebrex [Celecoxib] Hives    Consultations:  PMT  Oncology   Procedures/Studies: Dg Chest 1 View  Result Date: 06/09/2016 CLINICAL DATA:  Status post left thoracentesis. EXAM: CHEST 1 VIEW COMPARISON:  06/09/2016. Chest CT dated 05/27/2016. PET-CT dated 03/05/2016. FINDINGS: Normal sized heart. Mildly decreased left pleural fluid. Further increase in interstitial opacity in the left lung. No pneumothorax. The previously demonstrated right fourth posterior rib bone island is partially obscured by no and overlying lead. Patchy sclerosis and lucency in the right humeral head with progressive sclerosis. IMPRESSION: 1. No pneumothorax following left thoracentesis. 2. Mildly decreased left pleural fluid. 3. Further progression of interstitial spread of lung cancer on the left. 4. Increased sclerosis associated with the previously demonstrated right humeral head metastasis. Electronically Signed   By: Claudie Revering M.D.   On: 06/09/2016 15:22   Dg Chest 2 View  Result Date: 06/14/2016 CLINICAL DATA:  Pt c/o SOB starting this morning. Denies pain. Pt sts "I took a drink of water and got strangled." Pt was discharged from inpatient admission for PNA yesterday. Sts he was feeling better until he got choked this this morning. Hx of left lung CA. Last treatment around 4 weeks ago. Hx htn EXAM: CHEST  2 VIEW COMPARISON:  06/12/2016 FINDINGS: Extensive interstitial airspace opacities throughout the left lung, centered on the left mid lung, are without change from the recent prior exam, consistent with the patient's known carcinoma and lymphangitic spread. As noted previously, superimposed pneumonia is not excluded. Right lung is hyperexpanded but clear, also stable. Small left pleural effusion, stable.  No right pleural effusion. No  pneumothorax. Cardiac silhouette is normal in size. IMPRESSION: 1. No change from the recent prior study. 2. Extensive left lung airspace and interstitial opacities consistent with the known carcinoma and lymphangitic spread are stable. No new lung abnormalities. Stable left pleural effusion. Electronically Signed   By: Lajean Manes M.D.   On: 06/14/2016 09:43   Dg Chest 2 View  Result Date: 06/12/2016 CLINICAL DATA:  Shortness of breath, cough, right chest pain, lung cancer EXAM: CHEST  2 VIEW COMPARISON:  06/07/2016 FINDINGS: Similar diffuse left lung mixed airspace and interstitial opacities compatible with known lung cancer and diffuse lymphangitic or interstitial spread. Similar small left pleural effusion. Difficult to exclude superimposed pneumonia. Right lung remains hyper aerated and clear. Trachea is midline. Normal heart size and vascularity. Aorta is atherosclerotic. Bones are osteopenic and degenerative changes noted of the spine. Stable sclerotic changes in the right humerus proximally. IMPRESSION: Stable diffuse left lung mixed airspace and interstitial opacities compatible with known left lung cancer and diffuse lymphangitic tumor spread. Difficult to exclude superimposed pneumonia. Stable small left effusion No significant interval change since 06/09/2016 Electronically Signed   By: Jerilynn Mages.  Shick M.D.   On: 06/12/2016 09:10   Dg Chest 2 View  Result Date: 06/08/2016 CLINICAL DATA:  Lung cancer in shortness  of breath, worsening recently. EXAM: CHEST  2 VIEW COMPARISON:  Radiography 03/09/2016.  CT 05/27/2016. FINDINGS: Heart size is normal. There is worsening of patchy attic spread of cancer throughout the left lung. Moderate size right effusion with atelectasis in the right lower lung. Question if there could be developing interstitial spread in the right perihilar region now. Bone island in the right fourth rib again noted. IMPRESSION: Worsened interstitial spread of cancer within the left  lung. Left effusion with left base atelectasis. Question early development of perihilar interstitial spread on the right. Certainly, coexistent infectious pneumonia in the left lung is not excluded. Electronically Signed   By: Nelson Chimes M.D.   On: 06/08/2016 12:43   Ct Chest W Contrast  Result Date: 06/22/2016 CLINICAL DATA:  Shortness of breath. Infection versus lymphangitic spread of tumor. EXAM: CT CHEST WITH CONTRAST TECHNIQUE: Multidetector CT imaging of the chest was performed during intravenous contrast administration. CONTRAST:  1 ISOVUE-300 IOPAMIDOL (ISOVUE-300) INJECTION 61% COMPARISON:  Multiple recent chest x-rays.  CT scan May 27, 2016 FINDINGS: Cardiovascular: The thoracic aorta demonstrates no aneurysm or dissection. The left-sided tumor encases the left upper lobe pulmonary artery and the proximal lower lobe pulmonary arteries. No obvious central emboli seen on today's limited evaluation. The heart is unchanged. Mediastinum/Nodes: Adenopathy in the mediastinum and hila is worsened in the interval. The superior mediastinal node on series 2, image 43 measures 15 by 11 mm today versus 12 by 10 mm previously. A pretracheal node on image 58 measures 12 by 15 mm today versus 9 by 10 mm previously. A node anterior to the left main pulmonary artery measures 16 mm today versus 12 mm previously. The subcarinal node measures 2.3 cm in short axis today versus 1.8 cm previously. There is likely involvement of paraesophageal nodes. Nodes in the gastrohepatic ligament are larger in the interval, likely representing further involvement. Bilateral pleural effusions are seen. No significant pericardial effusion. The esophagus itself is unremarkable. Lungs/Pleura: The trachea and right-sided airways are normal. There is narrowing of the left upper lobe airways. There is bronchial wall thickening in the left lower lobe. No pneumothorax. Emphysematous changes seen in the lungs. The consolidation in the left  upper lobe has markedly worsened. The suspected tumor now measures 12 x 9 cm today versus 6 x 7 cm previously. Lymphangitic spread persists. Patchy opacity superior to the known tumor could represent a superimposed infectious process. The tumor extends along the left fissure and along the left lower lobe bronchovascular bundle. More patchy opacity in the left base could represent superimposed infection. There is probable lymphangitic spread in the left lower lobe as well. There is scarring in the left apex. The 10 mm nodule seen in the lateral right lung previously is smaller in the interval, largely represented by ground-glass today. A few other tiny nodules in the right lung are stable. No entirely new nodules on the right. Bilateral small effusions with associated atelectasis. Upper Abdomen: The left adrenal mass measures 3.7 cm today versus 3.3 cm previously. Shotty nodes in the gastrohepatic ligament with some increased attenuation of fat have worsened. A few other shotty nodes are seen in the upper abdomen. Low-attenuation in the spleen is consistent with splenic infarcts, not seen previously. No other acute abnormalities. Musculoskeletal: Known metastatic disease to the left humeral head. Sclerosis is in upper right rib is stable. The T10 lesion is stable in the interval. IMPRESSION: 1. The patient's primary tumor in the central left upper lobe is markedly larger  in the interval. It now extends into the left lower lobe along the fissure and bronchovascular bundle. There is lymphangitic spread in both the upper and lower left lobes. Patchy opacities adjacent to tumor in the left lung could represent superimposed infection. 2. Worsening adenopathy in the mediastinum and hila. 3. Probable developing metastatic adenopathy in the upper abdomen. 4. The left adrenal mass measures a little larger in the interval. The previous PET-CT suggested a collision metastatic lesion. 5. Splenic infarcts, new in the interval. 6.  Persistent metastatic disease in the right humeral head and T10 vertebral body. 7. The 10 mm nodule in the right lung has decreased in size. These results will be called to the ordering clinician or representative by the Radiologist Assistant, and communication documented in the PACS or zVision Dashboard. Electronically Signed   By: Dorise Bullion III M.D   On: 06/22/2016 16:34   Ct Chest W Contrast  Result Date: 05/27/2016 CLINICAL DATA:  Left lung cancer staging with metastatic disease to bone adrenal glands. EXAM: CT CHEST, ABDOMEN, AND PELVIS WITH CONTRAST TECHNIQUE: Multidetector CT imaging of the chest, abdomen and pelvis was performed following the standard protocol during bolus administration of intravenous contrast. CONTRAST:  132m ISOVUE-300 IOPAMIDOL (ISOVUE-300) INJECTION 61% COMPARISON:  CT chest from 02/29/2016. Abdomen and pelvis CT from 11/08/2015. PET-CT from 03/05/2016. FINDINGS: CT CHEST FINDINGS Cardiovascular: Heart size is normal. Stable appearance trace anterior pericardial fluid. Coronary artery calcification is noted. Atherosclerotic calcification is noted in the wall of the thoracic aorta. Mediastinum/Nodes: 8 mm short axis prevascular lymph node on image 28 series 2 is measured at 15 mm on the previous PET-CT. 12 mm short axis left hilar lymph node seen on image 30 was 18 mm when I remeasure it on the prior exam. 18 mm short axis subcarinal lymph node was 20 mm previously. No right hilar lymphadenopathy. Abnormal soft tissue attenuation in the left hilum is progressive in the interval. Lungs/Pleura: Persistent interlobular septal thickening and confluent airspace opacity is identified in the posterior left upper lobe, progressive in the interval. There is now a 5.7 x 6.9 cm masslike area of confluent airspace opacity, markedly progressive in the interval. This is in the region of hypermetabolism seen on the previous PET-CT. 11 mm posterior right upper lobe pulmonary nodule (image 77  series 7) is new in the interval. Scattered areas of peribronchovascular nodularity in the posterior right upper lobe suggest atypical infection. Musculoskeletal: Lytic lesions noted right humeral head. Compression deformity of an upper thoracic vertebral body is unchanged. 3.2 cm lytic lesion in the T10 vertebral body has progressed in the interval. CT ABDOMEN PELVIS FINDINGS Hepatobiliary: Scattered tiny hypodensities in the liver parenchyma were present previously are not substantially changed in the interval. These remain too small to characterize. Pancreas: No focal mass lesion. No dilatation of the main duct. No intraparenchymal cyst. No peripancreatic edema. Spleen: No splenomegaly. No focal mass lesion. Adrenals/Urinary Tract: Stable appearance 3.2 cm left adrenal lesion. Right adrenal gland remains normal in appearance. Cysts are identified in the kidneys bilaterally without appreciable interval change. No evidence for hydroureter. The urinary bladder appears normal for the degree of distention. Stomach/Bowel: Stomach is nondistended. No gastric wall thickening. No evidence of outlet obstruction. Duodenum is normally positioned as is the ligament of Treitz. No small bowel wall thickening. No small bowel dilatation. The terminal ileum is normal. The appendix is normal. Diverticular changes are noted in the left colon without evidence of diverticulitis. Vascular/Lymphatic: There is no gastrohepatic or  hepatoduodenal ligament lymphadenopathy. No intraperitoneal or retroperitoneal lymphadenopathy. There is abdominal aortic atherosclerosis without aneurysm. No pelvic sidewall lymphadenopathy. Reproductive: The prostate gland and seminal vesicles have normal imaging features. Other: No intraperitoneal free fluid. Musculoskeletal: No suspicious lytic or sclerotic bony abnormality in the abdomen and pelvis. IMPRESSION: 1. Marked interval progression of confluent airspace opacity in the parahilar left lung, highly  suspicious for disease progression. This region was hypermetabolic on the previous PET-CT and appears more confluent than typically seen for post radiation change. 2. Evidence of decreasing size mediastinal lymphadenopathy. 3. Interval development of a small posterior right upper lobe pulmonary nodule. Continued attention on follow-up recommended. 4. Interval progression of the lucent lesion in T10, seen to be hypermetabolic on previous PET-CT. Lesions in the right humeral head are incompletely visualized on today's study. 5. No change 3.2 cm left adrenal lesion, indeterminate by CT imaging. 6. Interval progression small left pleural effusion. 7.  Abdominal Aortic Atherosclerois (ICD10-170.0) 8.  Emphysema. ([id number]-A21.9) Electronically Signed   By: Misty Stanley M.D.   On: 05/27/2016 13:14   Mr Brain Wo Contrast  Result Date: 06/10/2016 CLINICAL DATA:  Initial evaluation for history of metastatic lung cancer and P with worsening dyspnea. EXAM: MRI HEAD WITHOUT CONTRAST TECHNIQUE: Multiplanar, multiecho pulse sequences of the brain and surrounding structures were obtained without intravenous contrast. COMPARISON:  Previous MRI from 03/10/2016. FINDINGS: Brain: Study is technically limited as the patient was unable to tolerate the full length of the exam. Only diffusion-weighted imaging, sagittal T1, with axial T2 and FLAIR sequences were performed. Additionally, images provided are somewhat degraded by motion artifact. Generalized age-related cerebral atrophy is stable from previous. Mild for age chronic microvascular ischemic changes also similar. No abnormal foci of restricted diffusion to suggest acute or subacute ischemia. Gray-white matter differentiation maintained. No evidence for chronic infarction. No mass lesion, midline shift, or mass effect. Ventricles normal in size without evidence for hydrocephalus. Small right frontal hygroma is stable from previous. No imaging findings to suggest metastatic  disease identified on this limited noncontrast examination. Pituitary gland and suprasellar region within normal limits. Vascular: Major intracranial vascular flow voids are maintained. Skull and upper cervical spine: Craniocervical junction normal. Degenerative thickening noted at the tectorial membrane without significant stenosis. Visualized upper cervical spine otherwise unremarkable. Bone marrow signal intensity within normal limits. No scalp soft tissue abnormality. Sinuses/Orbits: Globes and orbital soft tissues within normal limits. Patient is status post cataract extraction bilaterally. Mild scattered mucosal thickening within the ethmoidal air cells. Visualized paranasal sinuses are otherwise clear. No mastoid effusion. Inner ear structures grossly normal. IMPRESSION: 1. Technically limited study due to patient's inability to tolerate the full length of the exam and motion artifact. 2. No acute intracranial process identified. No evidence for intracranial metastatic disease on this limited noncontrast examination. 3. Stable small right frontal hygroma. Electronically Signed   By: Jeannine Boga M.D.   On: 06/10/2016 22:13   Ct Abdomen Pelvis W Contrast  Result Date: 05/27/2016 CLINICAL DATA:  Left lung cancer staging with metastatic disease to bone adrenal glands. EXAM: CT CHEST, ABDOMEN, AND PELVIS WITH CONTRAST TECHNIQUE: Multidetector CT imaging of the chest, abdomen and pelvis was performed following the standard protocol during bolus administration of intravenous contrast. CONTRAST:  172m ISOVUE-300 IOPAMIDOL (ISOVUE-300) INJECTION 61% COMPARISON:  CT chest from 02/29/2016. Abdomen and pelvis CT from 11/08/2015. PET-CT from 03/05/2016. FINDINGS: CT CHEST FINDINGS Cardiovascular: Heart size is normal. Stable appearance trace anterior pericardial fluid. Coronary artery calcification is noted. Atherosclerotic calcification  is noted in the wall of the thoracic aorta. Mediastinum/Nodes: 8 mm  short axis prevascular lymph node on image 28 series 2 is measured at 15 mm on the previous PET-CT. 12 mm short axis left hilar lymph node seen on image 30 was 18 mm when I remeasure it on the prior exam. 18 mm short axis subcarinal lymph node was 20 mm previously. No right hilar lymphadenopathy. Abnormal soft tissue attenuation in the left hilum is progressive in the interval. Lungs/Pleura: Persistent interlobular septal thickening and confluent airspace opacity is identified in the posterior left upper lobe, progressive in the interval. There is now a 5.7 x 6.9 cm masslike area of confluent airspace opacity, markedly progressive in the interval. This is in the region of hypermetabolism seen on the previous PET-CT. 11 mm posterior right upper lobe pulmonary nodule (image 77 series 7) is new in the interval. Scattered areas of peribronchovascular nodularity in the posterior right upper lobe suggest atypical infection. Musculoskeletal: Lytic lesions noted right humeral head. Compression deformity of an upper thoracic vertebral body is unchanged. 3.2 cm lytic lesion in the T10 vertebral body has progressed in the interval. CT ABDOMEN PELVIS FINDINGS Hepatobiliary: Scattered tiny hypodensities in the liver parenchyma were present previously are not substantially changed in the interval. These remain too small to characterize. Pancreas: No focal mass lesion. No dilatation of the main duct. No intraparenchymal cyst. No peripancreatic edema. Spleen: No splenomegaly. No focal mass lesion. Adrenals/Urinary Tract: Stable appearance 3.2 cm left adrenal lesion. Right adrenal gland remains normal in appearance. Cysts are identified in the kidneys bilaterally without appreciable interval change. No evidence for hydroureter. The urinary bladder appears normal for the degree of distention. Stomach/Bowel: Stomach is nondistended. No gastric wall thickening. No evidence of outlet obstruction. Duodenum is normally positioned as is  the ligament of Treitz. No small bowel wall thickening. No small bowel dilatation. The terminal ileum is normal. The appendix is normal. Diverticular changes are noted in the left colon without evidence of diverticulitis. Vascular/Lymphatic: There is no gastrohepatic or hepatoduodenal ligament lymphadenopathy. No intraperitoneal or retroperitoneal lymphadenopathy. There is abdominal aortic atherosclerosis without aneurysm. No pelvic sidewall lymphadenopathy. Reproductive: The prostate gland and seminal vesicles have normal imaging features. Other: No intraperitoneal free fluid. Musculoskeletal: No suspicious lytic or sclerotic bony abnormality in the abdomen and pelvis. IMPRESSION: 1. Marked interval progression of confluent airspace opacity in the parahilar left lung, highly suspicious for disease progression. This region was hypermetabolic on the previous PET-CT and appears more confluent than typically seen for post radiation change. 2. Evidence of decreasing size mediastinal lymphadenopathy. 3. Interval development of a small posterior right upper lobe pulmonary nodule. Continued attention on follow-up recommended. 4. Interval progression of the lucent lesion in T10, seen to be hypermetabolic on previous PET-CT. Lesions in the right humeral head are incompletely visualized on today's study. 5. No change 3.2 cm left adrenal lesion, indeterminate by CT imaging. 6. Interval progression small left pleural effusion. 7.  Abdominal Aortic Atherosclerois (ICD10-170.0) 8.  Emphysema. (GUR42-H06.9) Electronically Signed   By: Misty Stanley M.D.   On: 05/27/2016 13:14   Dg Chest Port 1 View  Result Date: 06/21/2016 CLINICAL DATA:  Dyspnea for 1 day. EXAM: PORTABLE CHEST 1 VIEW COMPARISON:  06/21/2016 FINDINGS: Persistent consolidation in the left upper lobe, unchanged. Patchy opacities in the left base, also unchanged. Chronic blunting of the left lateral costophrenic angle. Possible 5 mm nodule in the right apex K  Right lung is otherwise clear. Pulmonary vasculature is  normal. IMPRESSION: Persistent confluent consolidation in the left upper lobe. Persistent patchy opacities in the left base. Possible new nodule in the right apex. Electronically Signed   By: Andreas Newport M.D.   On: 06/21/2016 19:57   Dg Chest Port 1 View  Result Date: 06/21/2016 CLINICAL DATA:  82 year old male with shortness of breath and cough. Stage IV lung cancer. Initial encounter. EXAM: PORTABLE CHEST 1 VIEW COMPARISON:  06/14/2016 and earlier. FINDINGS: Portable AP semi upright view at at 0810 hours. Increased confluence and density of diffuse left mid and upper lung opacity which was previously thought to represent lung cancer with lymphangitic carcinomatosis. Small left pleural effusion is stable. No superimposed pneumothorax or pulmonary edema. Stable and negative right lung. Stable cardiac size and mediastinal contours. Visualized tracheal air column is within normal limits. IMPRESSION: 1. Progressed density of the left mid and upper lung. 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 2. Small left pleural effusion is stable. Electronically Signed   By: Genevie Ann M.D.   On: 06/21/2016 08:30   US Thoracentesis Asp Pleural Space W/img Guide  Result Date: 06/09/2016 INDICATION: Patient with a history of lung cancer with metastatic disease. Left pleural effusion noted on recent imaging. Request is made for diagnostic and therapeutic thoracentesis. EXAM: ULTRASOUND GUIDED DIAGNOSTIC AND THERAPEUTIC THORACENTESIS MEDICATIONS: 1% lidocaine COMPLICATIONS: None immediate. PROCEDURE: An ultrasound guided thoracentesis was thoroughly discussed with the patient and questions answered. The benefits, risks, alternatives and complications were also discussed. The patient understands and wishes to proceed with the procedure. Written consent was obtained. Ultrasound was performed to localize and  mark an adequate pocket of fluid in the left chest. The area was then prepped and draped in the normal sterile fashion. 1% Lidocaine was used for local anesthesia. Under ultrasound guidance a Safe-T-Centesis catheter was introduced. Thoracentesis was performed. The catheter was removed and a dressing applied. FINDINGS: A total of approximately 2 L of bloody fluid was removed. Samples were sent to the laboratory as requested by the clinical team. IMPRESSION: Successful ultrasound guided left thoracentesis yielding 2 L of pleural fluid. Read by: Saverio Danker, PA-C Electronically Signed   By: Lucrezia Europe M.D.   On: 06/09/2016 15:41    Subjective: He relates  his breathing is much better compared to yesterday.  Discharge Exam: Vitals:   06/23/16 2222 06/24/16 0525  BP: 100/63 101/70  Pulse: (!) 117 89  Resp: 19 19  Temp: 98.3 F (36.8 C) (!) 96.8 F (36 C)   Vitals:   06/23/16 1508 06/23/16 2126 06/23/16 2222 06/24/16 0525  BP:   100/63 101/70  Pulse:   (!) 117 89  Resp:   19 19  Temp:   98.3 F (36.8 C) (!) 96.8 F (36 C)  TempSrc:   Axillary Axillary  SpO2: 92% 93% 94% 95%  Weight:      Height:        General: Pt is alert, awake, not in acute distress Cardiovascular: RRR, S1/S2 +, no rubs, no gallops Respiratory: CTA bilaterally, no wheezing, no rhonchi Abdominal: Soft, NT, ND, bowel sounds + Extremities: no edema, no cyanosis    The results of significant diagnostics from this hospitalization (including imaging, microbiology, ancillary and laboratory) are listed below for reference.     Microbiology: Recent Results (from the past 240 hour(s))  Culture, blood (Routine X 2) w Reflex to ID Panel     Status: None (Preliminary result)   Collection Time: 06/21/16 11:23  AM  Result Value Ref Range Status   Specimen Description BLOOD BLOOD LEFT FOREARM  Final   Special Requests BOTTLES DRAWN AEROBIC AND ANAEROBIC 5 CC EACH  Final   Culture   Final    NO GROWTH 2 DAYS Performed  at [facility], [address], [place], [place] [address]    Report Status PENDING  Incomplete  Culture, blood (Routine X 2) w Reflex to ID Panel     Status: None (Preliminary result)   Collection Time: 06/21/16 11:23 AM  Result Value Ref Range Status   Specimen Description BLOOD RIGHT HAND  Final   Special Requests IN PEDIATRIC BOTTLE 4CC  Final   Culture   Final    NO GROWTH 2 DAYS Performed at [facility], [place] [address], [place], [place] [address]    Report Status PENDING  Incomplete  MRSA PCR Screening     Status: None   Collection Time: 06/21/16  3:49 PM  Result Value Ref Range Status   MRSA by PCR NEGATIVE NEGATIVE Final    Comment:        The GeneXpert MRSA Assay (FDA approved for NASAL specimens only), is one component of a comprehensive MRSA colonization surveillance program. It is not intended to diagnose MRSA infection nor to guide or monitor treatment for MRSA infections.   Culture, sputum-assessment     Status: None   Collection Time: 06/21/16  8:54 PM  Result Value Ref Range Status   Specimen Description SPUTUM  Final   Special Requests NONE  Final   Sputum evaluation THIS SPECIMEN IS ACCEPTABLE FOR SPUTUM CULTURE  Final   Report Status 06/21/2016 FINAL  Final  Culture, respiratory (NON-Expectorated)     Status: None (Preliminary result)   Collection Time: 06/21/16  8:54 PM  Result Value Ref Range Status   Specimen Description SPUTUM  Final   Special Requests NONE Reflexed from [id number]  Final   Gram Stain   Final    FEW WBC PRESENT, PREDOMINANTLY PMN MODERATE GRAM NEGATIVE RODS FEW GRAM POSITIVE COCCI IN PAIRS FEW FUNGAL ELEMENTS SEEN    Culture   Final    CULTURE REINCUBATED FOR BETTER GROWTH Performed at [facility], [place] [address], [place], [place] [address]    Report Status PENDING  Incomplete     Labs: BNP (last 3 results)  Recent Labs  03/09/16 1456  BNP 91.4   Basic Metabolic Panel:  Recent Labs Lab  06/20/16 1359 06/21/16 0858 06/22/16 0350  NA 138 137 137  K 4.9 4.6 4.4  CL  --  106 110  CO2 '24 22 22  '$ GLUCOSE 120 132* 122*  BUN 35.9* 34* 27*  CREATININE 1.2 1.39* 1.24  CALCIUM 8.4 7.5* 6.8*   Liver Function Tests:  Recent Labs Lab 06/20/16 1359  AST 65*  ALT 44  ALKPHOS 69  BILITOT 0.35  PROT 6.0*  ALBUMIN <2.0*   No results for input(s): LIPASE, AMYLASE in the last 168 hours. No results for input(s): AMMONIA in the last 168 hours. CBC:  Recent Labs Lab 06/20/16 1359 06/21/16 0858 06/22/16 0350  WBC 13.6* 11.8* 11.5*  NEUTROABS 11.3*  --   --   HGB 8.7* 8.1* 9.1*  HCT 26.9* 25.4* 28.3*  MCV 94.0 94.4 91.9  PLT 292 202 168   Cardiac Enzymes:  Recent Labs Lab 06/21/16 1644 06/21/16 2135 06/22/16 0350  TROPONINI 0.08* 0.13* 0.11*   BNP: Invalid input(s): POCBNP CBG: No results for  input(s): GLUCAP in the last 168 hours. D-Dimer No results for input(s): DDIMER in the last 72 hours. Hgb A1c No results for input(s): HGBA1C in the last 72 hours. Lipid Profile No results for input(s): CHOL, HDL, LDLCALC, TRIG, CHOLHDL, LDLDIRECT in the last 72 hours. Thyroid function studies No results for input(s): TSH, T4TOTAL, T3FREE, THYROIDAB in the last 72 hours.  Invalid input(s): FREET3 Anemia work up No results for input(s): VITAMINB12, FOLATE, FERRITIN, TIBC, IRON, RETICCTPCT in the last 72 hours. Urinalysis    Component Value Date/Time   COLORURINE YELLOW 03/09/2016 1754   APPEARANCEUR CLEAR 03/09/2016 1754   LABSPEC 1.023 03/09/2016 1754   PHURINE 5.5 03/09/2016 1754   GLUCOSEU NEGATIVE 03/09/2016 1754   HGBUR NEGATIVE 03/09/2016 1754   BILIRUBINUR NEGATIVE 03/09/2016 1754   BILIRUBINUR n 06/07/2014 0929   KETONESUR NEGATIVE 03/09/2016 1754   PROTEINUR NEGATIVE 03/09/2016 1754   UROBILINOGEN 0.2 06/07/2014 0929   NITRITE NEGATIVE 03/09/2016 1754   LEUKOCYTESUR NEGATIVE 03/09/2016 1754   Sepsis Labs Invalid input(s): PROCALCITONIN,  WBC,   LACTICIDVEN Microbiology Recent Results (from the past 240 hour(s))  Culture, blood (Routine X 2) w Reflex to ID Panel     Status: None (Preliminary result)   Collection Time: 06/21/16 11:23 AM  Result Value Ref Range Status   Specimen Description BLOOD BLOOD LEFT FOREARM  Final   Special Requests BOTTLES DRAWN AEROBIC AND ANAEROBIC 5 CC EACH  Final   Culture   Final    NO GROWTH 2 DAYS Performed at [facility], [place] [address], [place], [place] [address]    Report Status PENDING  Incomplete  Culture, blood (Routine X 2) w Reflex to ID Panel     Status: None (Preliminary result)   Collection Time: 06/21/16 11:23 AM  Result Value Ref Range Status   Specimen Description BLOOD RIGHT HAND  Final   Special Requests IN PEDIATRIC BOTTLE 4CC  Final   Culture   Final    NO GROWTH 2 DAYS Performed at [facility], [place] [address], [place], [place] [address]    Report Status PENDING  Incomplete  MRSA PCR Screening     Status: None   Collection Time: 06/21/16  3:49 PM  Result Value Ref Range Status   MRSA by PCR NEGATIVE NEGATIVE Final    Comment:        The GeneXpert MRSA Assay (FDA approved for NASAL specimens only), is one component of a comprehensive MRSA colonization surveillance program. It is not intended to diagnose MRSA infection nor to guide or monitor treatment for MRSA infections.   Culture, sputum-assessment     Status: None   Collection Time: 06/21/16  8:54 PM  Result Value Ref Range Status   Specimen Description SPUTUM  Final   Special Requests NONE  Final   Sputum evaluation THIS SPECIMEN IS ACCEPTABLE FOR SPUTUM CULTURE  Final   Report Status 06/21/2016 FINAL  Final  Culture, respiratory (NON-Expectorated)     Status: None (Preliminary result)   Collection Time: 06/21/16  8:54 PM  Result Value Ref Range Status   Specimen Description SPUTUM  Final   Special Requests NONE Reflexed from [id number]  Final   Gram Stain   Final    FEW WBC PRESENT,  PREDOMINANTLY PMN MODERATE GRAM NEGATIVE RODS FEW GRAM POSITIVE COCCI IN PAIRS FEW FUNGAL ELEMENTS SEEN    Culture   Final    CULTURE REINCUBATED FOR BETTER GROWTH Performed at [facility], [place]  [address], [place], [place] [address]    Report Status PENDING  Incomplete     Time coordinating discharge: Over 30 minutes  SIGNED:   Charlynne Cousins, MD  [facility] 06/24/2016, 10:41 AM Pager   If 7PM-7AM, please contact night-coverage [email or web address] Password [email or web address]

## 2016-06-24 NOTE — Progress Notes (Signed)
CSW assisted with d/c planning to [facility] today. Pt / family was in agreement with d/c plan. [facility] transport was required. Medical necessity form completed. D/C summary sent to hospice home for review. # for transport provided to nsg.  Werner Lean LCSW [phone]

## 2016-06-24 NOTE — Progress Notes (Signed)
Pt discharged to [facility] via [facility] in stable condition. Pt medicated with pain meds prior to transport.  No immediate questions or concerns from patient or family at this time.

## 2016-06-24 NOTE — Telephone Encounter (Signed)
Tim Ross at [facility] is f/u on fax they sent on 1/29 for prior authorization for the oxycontin 10 mg controlled release tabs. The PA# is [id number].  Note yesterday documentation by Annamary Rummage oxycontin was denied.

## 2016-06-24 NOTE — Progress Notes (Signed)
Spoke with Tim Ross, wife of [name]. She requested not to wake this patient at this time for scheduled MN medication. Will monitor and assess pt needs and concerns

## 2016-06-24 NOTE — Progress Notes (Signed)
Report called to Helene Kelp, Therapist, sports at [facility].

## 2016-06-25 ENCOUNTER — Ambulatory Visit: Payer: Medicare Other

## 2016-06-25 NOTE — Progress Notes (Signed)
Oncology Progress Note  DOS: 06/23/2016  Att:  Dr Chancy Milroy: I was informed by Dr. Philippa Chester that Mr. Toral was admitted to the hospital with increasing shortness of breath. His CT scan of the chest shows disease progression and likely worsening lymphangitic involvement of his left lung. He has had significant anorexia and has not been eating and drinking enough to sustain himself leading to progressive fatigue and debility. I spent a significant period of time with his daughter , wife and several family members at bedside in the ICU. He is currently on a nonrebreather and is having significantly increased work of breathing and increased symptoms of dyspnea cough and anxiety. I along with Dr. Hilma Favors from palliative care have discussed extensively with the patient and his family regarding goals of care. The patient now has clarity that he would like to be kept comfortable and would not want to pursue any further definitive lung cancer treatments. This is a very reasonable decision given his burden of symptoms and progressive debility. Currently his symptoms and physical status may require more help than can be provided with home hospice. He and his family are willing to consider transfer to [facility] for end-of-life and best supportive cares.  O; VS review Gen- mildly anxiety with some increased work of breathing. CVS s1s2 tachycardia RS - air entry reduced left lung base. Bilateral basal rales PA soft BS+ Neuro: slightly sleepy due to Ativan and morphine.  LABS -reviewed per EPIC  -CT chest -reviewed per EPIC.    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 EGFR , ALK , ROS 1 and BRAF mutation negative based on foundation One testing  Patient is s/p 3 cycles of Carboplatin/Alimta. #2 painful right humerus metastatic lesion requiring significant pain medications. Pain resolved with palliative RT Assessment CT CAP  on 05/27/2016 shows signs of interval progression of left lung mass and T10 lesion. MRI Brain 06/10/2016 - no brain mets Patient was recently admitted to hospital with Left sided malignant pleural effusion s/p therapeutic thoracentesis (cytology +ve for malignant cells) . Concern with possible pneumonia #3 protein calorie malnutrition  -Encouraged to maintain small frequent meals. Poor appetite currently. #4 fatigue and depression PLAN -I discussed in details as noted with patient and daughter wife and other extended family his goals of care for metastatic lung adenocarcinoma with worsening performance status and debility in the setting of disease progression and poor oral intake. -Currently symptom burden appears to have started increasing and at this point he will not meaningfully benefit from additional definitive treatments for his lung cancer. -Patient and his family appropriately choose to pursue a best supportive comfort care's through hospice. -I appreciate Dr. Delanna Ahmadi palliative care input and medication management for comfort cares. He is appropriately on scheduled morphine and when necessary Ativan for dyspnea and anxiety. -He shall be likely transferring to [facility] for comfort cares. -Appreciate care by Dr. [facility] medicine. -I shall be available for any additional questions and for anything else I could help with the patient's care.  Sullivan Lone MD MS Hematology/Oncology Physician [facility]  Total time spent 40 minutes >50% of the time on direct patient contact and discussions with family at bedside

## 2016-06-26 ENCOUNTER — Ambulatory Visit: Payer: Medicare Other

## 2016-06-26 LAB — CULTURE, BLOOD (ROUTINE X 2)
CULTURE: NO GROWTH
Culture: NO GROWTH

## 2016-06-27 ENCOUNTER — Ambulatory Visit: Payer: Medicare Other

## 2016-06-27 NOTE — Telephone Encounter (Signed)
Pt has been moved to residential Hospice for comfort care. Nothing further needed at this time.

## 2016-06-28 ENCOUNTER — Telehealth: Payer: Self-pay | Admitting: Hematology

## 2016-06-28 NOTE — Telephone Encounter (Signed)
Message sent to [facility] per add on for 07/01/16. No appointments available with Dr Irene Limbo on 07/01/16, appointment was scheduled for 06/30/16. Left message on patient's voice mail with follow up and lab appointment info. Awaiting response from [facility] regarding Chemo appointment, per 06/20/16 los.

## 2016-06-30 ENCOUNTER — Telehealth: Payer: Self-pay | Admitting: *Deleted

## 2016-06-30 ENCOUNTER — Ambulatory Visit: Payer: Medicare Other

## 2016-06-30 ENCOUNTER — Telehealth: Payer: Self-pay | Admitting: Hematology

## 2016-06-30 ENCOUNTER — Other Ambulatory Visit: Payer: Medicare Other

## 2016-06-30 ENCOUNTER — Ambulatory Visit: Payer: Medicare Other | Admitting: Hematology

## 2016-06-30 NOTE — Telephone Encounter (Signed)
Called patient to confirm appointment and was told by his wife Alphonso Gregson, that the patient had expired yesterday 07/03/2016 @ 6 a.m.

## 2016-06-30 NOTE — Telephone Encounter (Signed)
Message received from pt's wife:  Pt expired 06/25/2016, will cancel future appointments.  Dr. Irene Limbo informed.

## 2016-07-01 ENCOUNTER — Ambulatory Visit: Payer: Medicare Other

## 2016-07-01 ENCOUNTER — Other Ambulatory Visit: Payer: Medicare Other

## 2016-07-02 ENCOUNTER — Ambulatory Visit: Payer: Medicare Other

## 2016-07-03 ENCOUNTER — Ambulatory Visit: Payer: Medicare Other

## 2016-07-04 ENCOUNTER — Ambulatory Visit: Payer: Medicare Other

## 2016-07-07 ENCOUNTER — Ambulatory Visit: Payer: Medicare Other

## 2016-07-07 NOTE — Progress Notes (Signed)
  Radiation Oncology         [phone]) [phone] ________________________________  Name: Tim Ross MRN: [id number]  Date: 06/20/2016  DOB: 10-17-1934  End of Treatment Note  Diagnosis:   Stage IV adenocarcinoma of the lung with osseous metastasis (Low PDL 1 expression at 20%. EGFR , ALK , ROS 1 and BRAF mutation negative based on foundation One testing.)  Indication for treatment: Palliative  Radiation treatment dates: 06/17/16 - 06/20/16  Site/dose:    1) Chest (Left Lung): 12 Gy in 4 fractions out of 30 Gy in 10 fractions. 2) Thoracic Spine (T10): 12 Gy in 4 fractions out of 30 Gy in 10 fractions.  Beams/energy:    1) 3D // 10X, 6X 2) Isodose Plan // 15X Photon  Narrative: During the first weekly under-treatment assessment, patient felt that something was stuck in his throat, he denied a cough, and his oxygen saturation was low at 89% on arrival to the clinic. He had mildly decreased breath sounds in the left lung. The patient presented to the ED on 06/21/16 for acute on chronic hypoxic respiratory failure due to HCAP and worsening metastatic disease with lymphangitic spread. The patient was referred to hospice and his radiation treatment was discontinued  Plan: The patient's pallaitive treatment was ended early due to progression and the patient was referred to hospice. The patient will see me on a PRN basis.   -----------------------------------  Blair Promise, PhD, MD  This document serves as a record of services personally performed by Gery Pray, MD. It was created on his behalf by Darcus Austin, a trained medical scribe. The creation of this record is based on the scribe's personal observations and the provider's statements to them. This document has been checked and approved by the attending provider.

## 2016-07-17 DEATH — deceased

## 2017-03-09 IMAGING — DX DG CHEST 1V PORT
1 series · 2 of 2 positions shown · non-contrast
Comparison: 06/21/2016

CLINICAL DATA: Dyspnea for 1 day.

EXAM:
PORTABLE CHEST 1 VIEW

[Series 1: chest ap · 0.14mm/px · 2 of 2 slices shown]
[im 1/2]
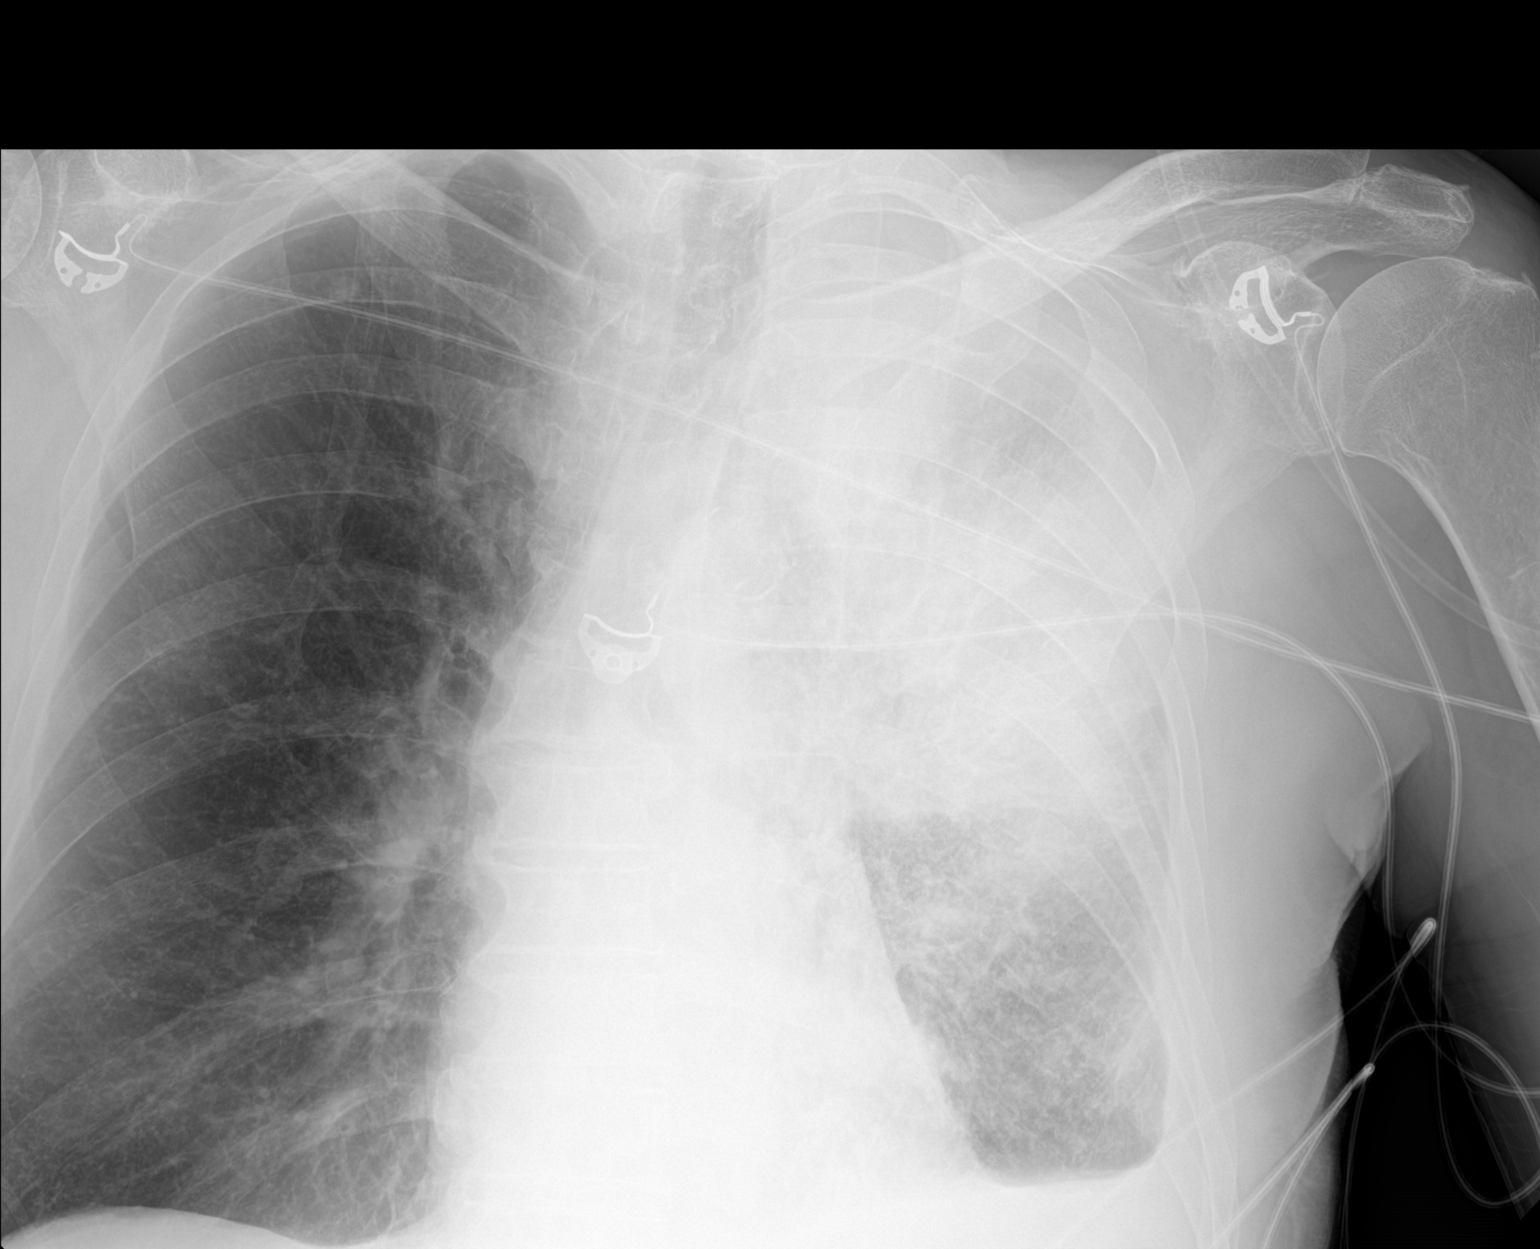
[im 2/2]
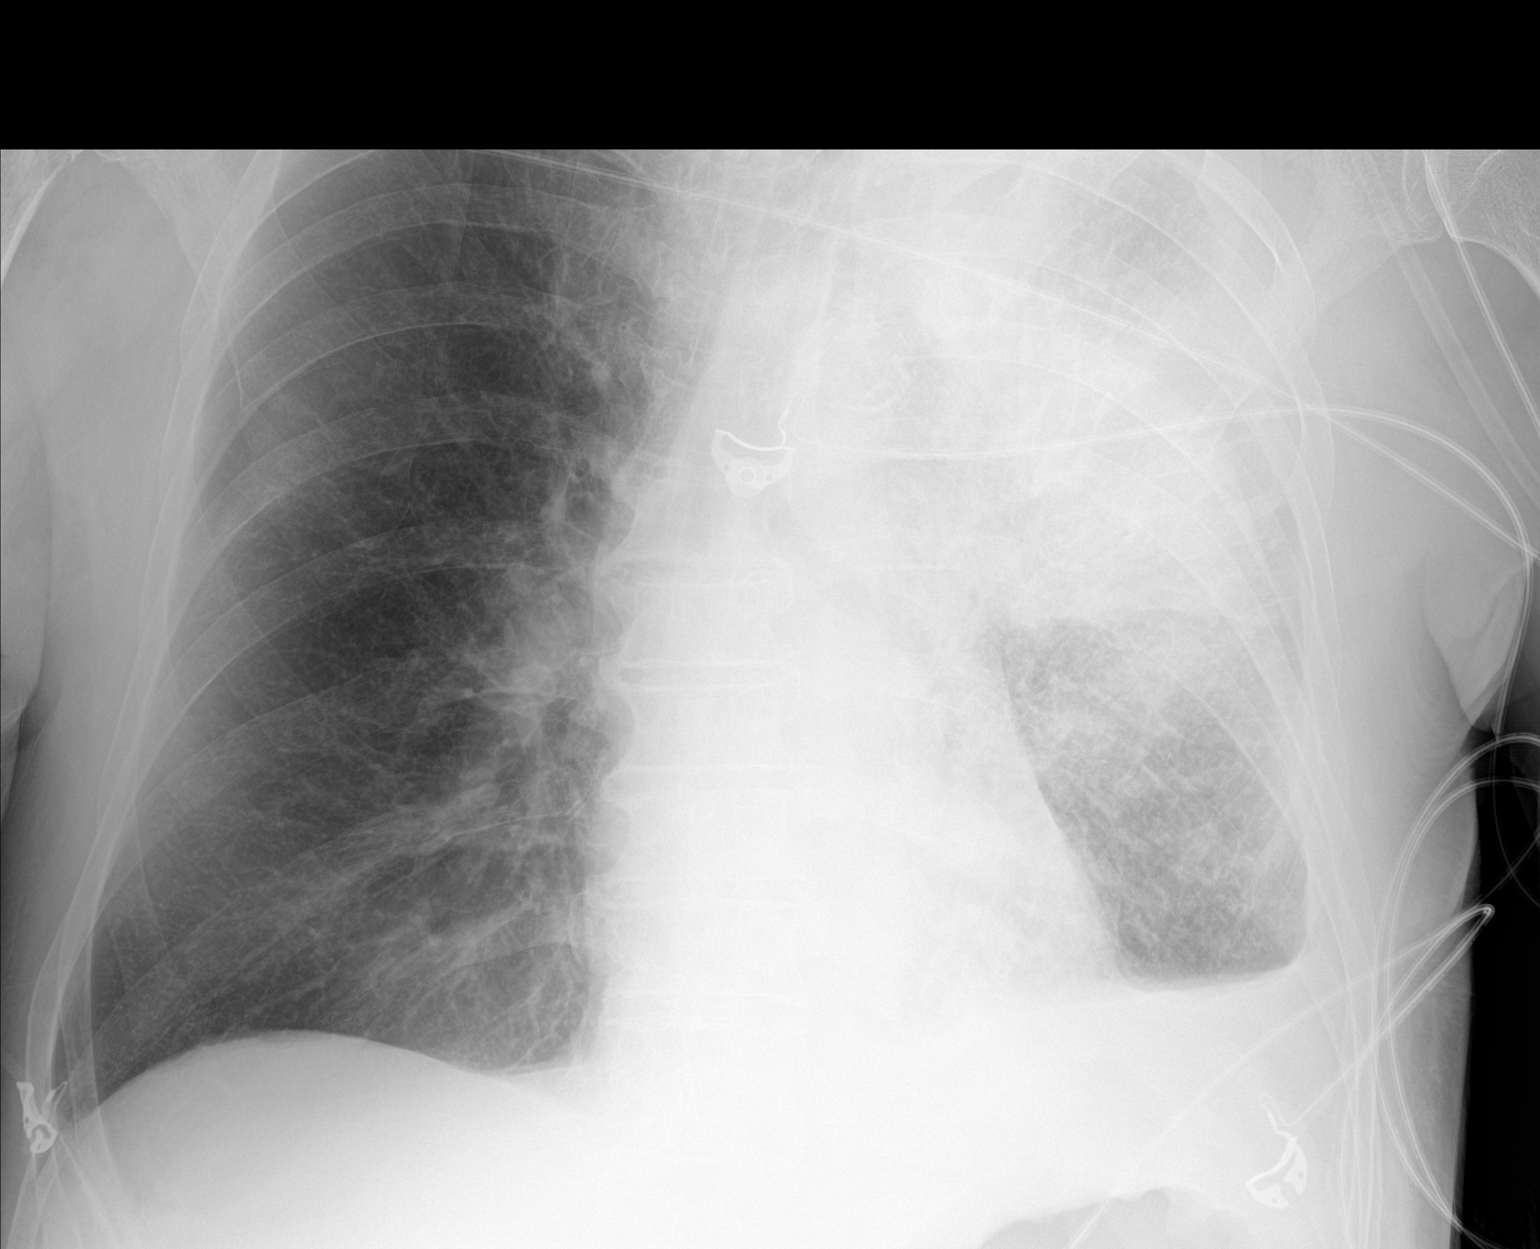

[2 of 2 positions shown; findings below may reference images not displayed]

FINDINGS: Persistent consolidation in the left upper lobe, unchanged. Patchy
opacities in the left base, also unchanged. Chronic blunting of the
left lateral costophrenic angle. Possible 5 mm nodule in the right
apex K Right lung is otherwise clear. Pulmonary vasculature is
normal.
IMPRESSION: Persistent confluent consolidation in the left upper lobe.
Persistent patchy opacities in the left base. Possible new nodule in
the right apex.

## 2017-03-10 IMAGING — CT CT CHEST W/ CM
2 of 4 series · 14 of 36 positions shown, 17 images · IV contrast (iopamidol)
Comparison: Multiple recent chest x-rays.  CT scan May 27, 2016

CLINICAL DATA: Shortness of breath. Infection versus lymphangitic
spread of tumor.

EXAM:
CT CHEST WITH CONTRAST
TECHNIQUE: Multidetector CT imaging of the chest was performed during
intravenous contrast administration.
CONTRAST:  1 3NAIM5-RHH IOPAMIDOL (3NAIM5-RHH) INJECTION 61%

[Series 2: axial st · axial · 0.93mm/px · z∈[+1050,+1378]mm · 11 of 192 slices shown, 14 images]
[im 14/192  mediastinal]
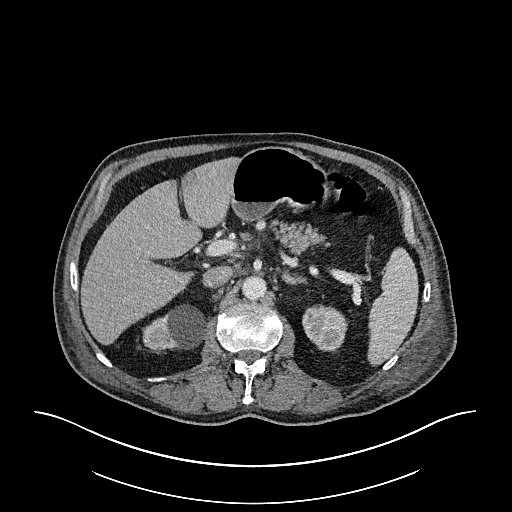
[im 14/192  lung]
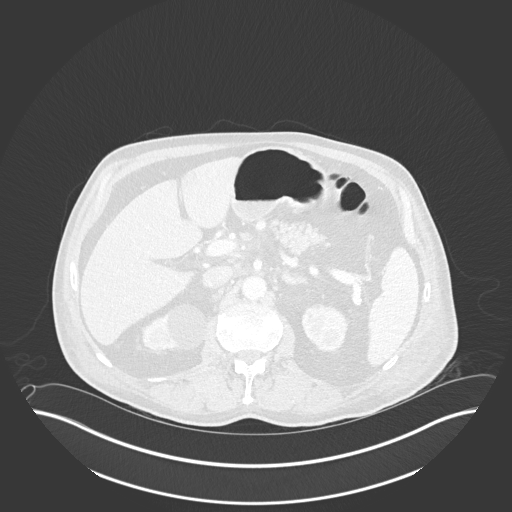
[im 28/192  lung]
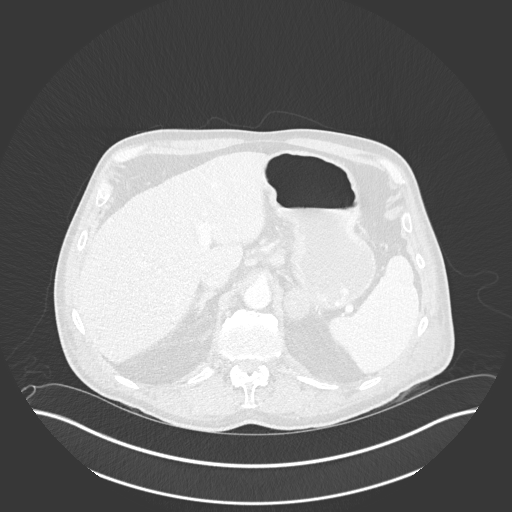
[im 41/192  lung]
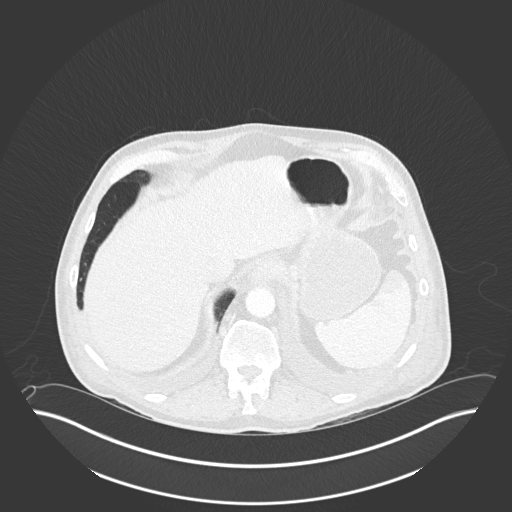
[im 69/192  lung]
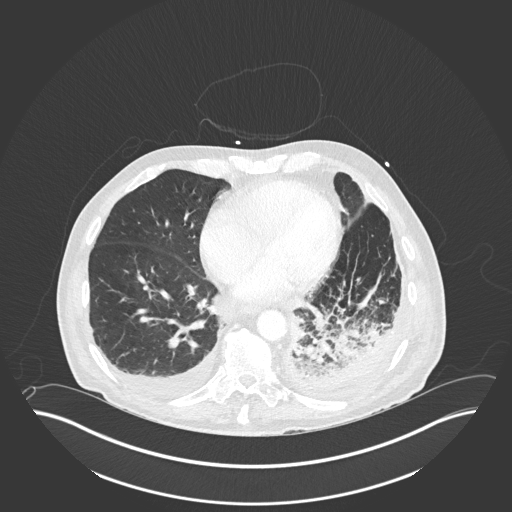
[im 82/192  mediastinal]
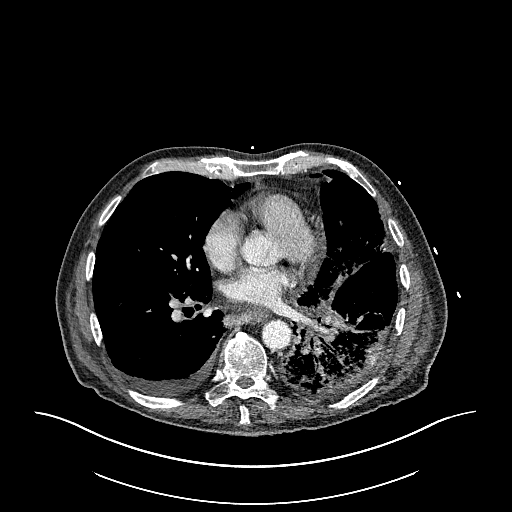
[im 82/192  lung]
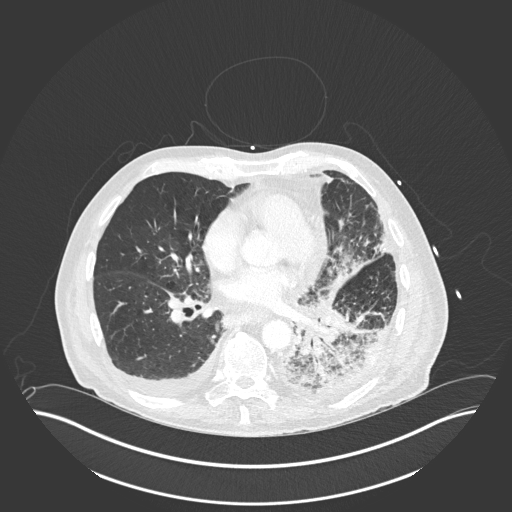
[im 96/192  lung]
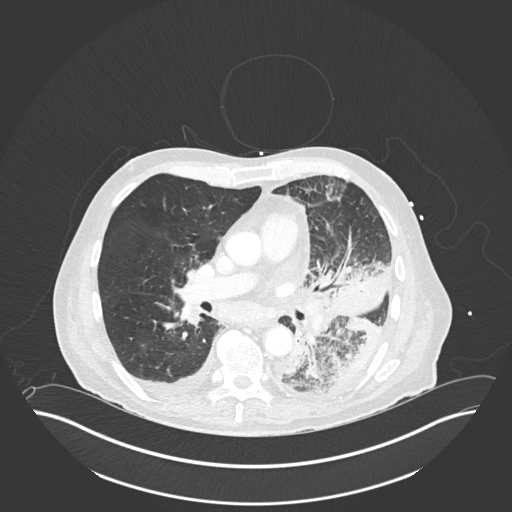
[im 110/192  lung]
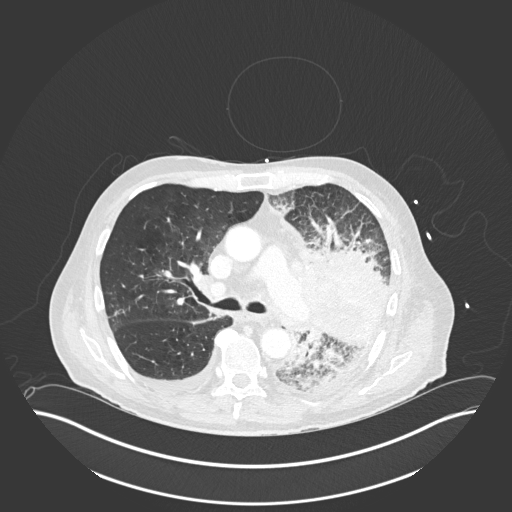
[im 123/192  lung]
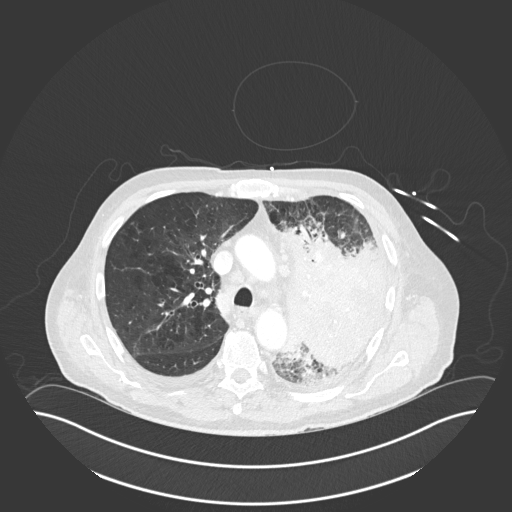
[im 151/192  mediastinal]
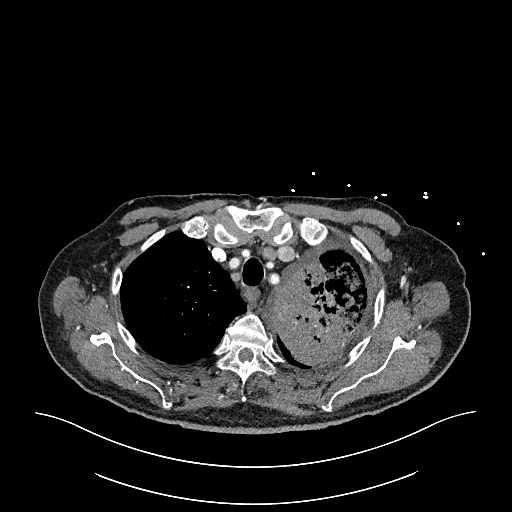
[im 151/192  lung]
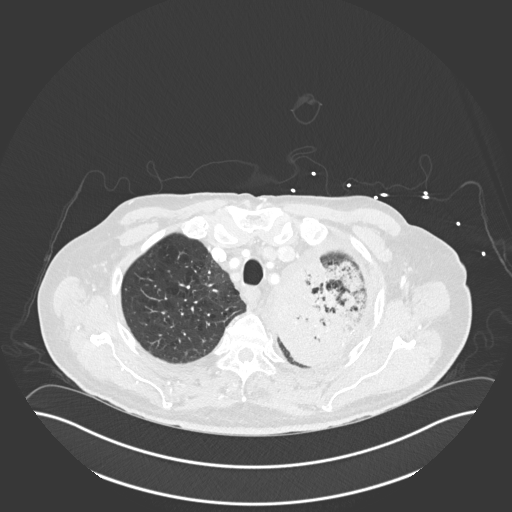
[im 164/192  lung]
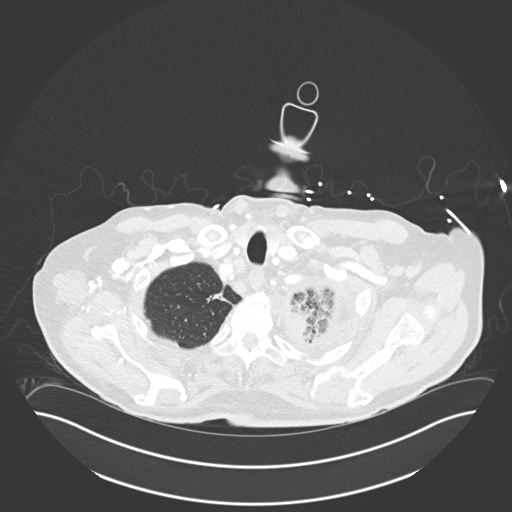
[im 178/192  lung]
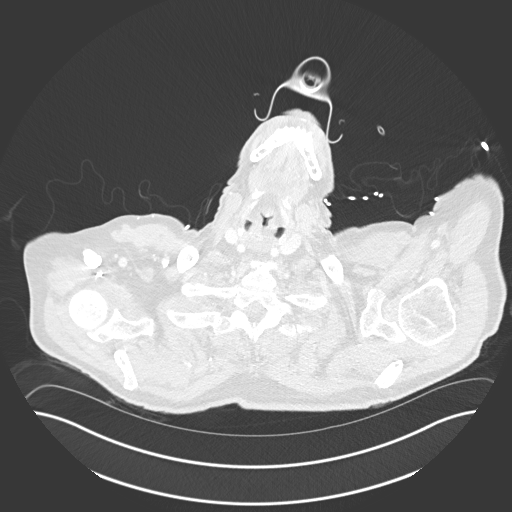

[Series 5: coronal · coronal · 0.74mm/px · 3 of 139 slices shown]
[im 28/139  lung]
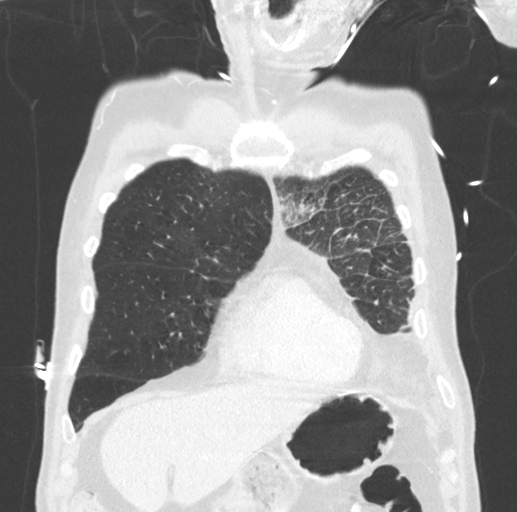
[im 56/139  lung]
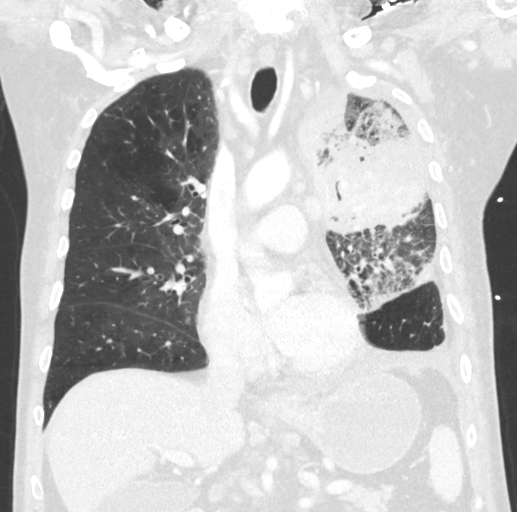
[im 83/139  lung]
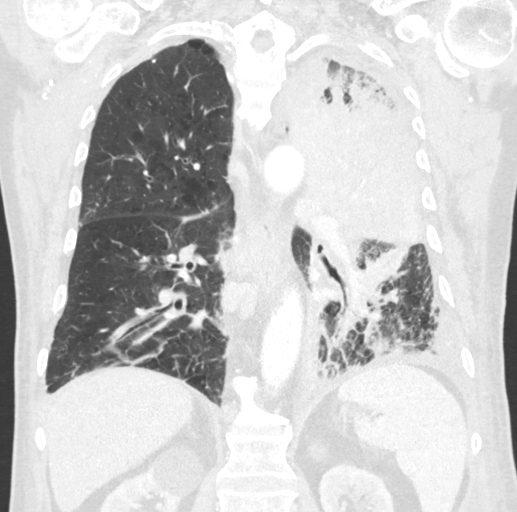

[14 of 36 positions shown; findings below may reference images not displayed]

FINDINGS: Cardiovascular: The thoracic aorta demonstrates no aneurysm or
dissection. The left-sided tumor encases the left upper lobe
pulmonary artery and the proximal lower lobe pulmonary arteries. No
obvious central emboli seen on today's limited evaluation. The heart
is unchanged.

Mediastinum/Nodes: Adenopathy in the mediastinum and hila is
worsened in the interval. The superior mediastinal node on series 2,
image 43 measures 15 by 11 mm today versus 12 by 10 mm previously. A
10 mm previously. A node anterior to the left main pulmonary artery
measures 16 mm today versus 12 mm previously. The subcarinal node
measures 2.3 cm in short axis today versus 1.8 cm previously. There
is likely involvement of paraesophageal nodes. Nodes in the
gastrohepatic ligament are larger in the interval, likely
representing further involvement. Bilateral pleural effusions are
seen. No significant pericardial effusion. The esophagus itself is
unremarkable.

Lungs/Pleura: The trachea and right-sided airways are normal. There
is narrowing of the left upper lobe airways. There is bronchial wall
thickening in the left lower lobe. No pneumothorax. Emphysematous
changes seen in the lungs. The consolidation in the left upper lobe
has markedly worsened. The suspected tumor now measures 12 x 9 cm
today versus 6 x 7 cm previously. Lymphangitic spread persists.
Patchy opacity superior to the known tumor could represent a
superimposed infectious process. The tumor extends along the left
fissure and along the left lower lobe bronchovascular bundle. More
patchy opacity in the left base could represent superimposed
infection. There is probable lymphangitic spread in the left lower
lobe as well. There is scarring in the left apex. The 10 mm nodule
seen in the lateral right lung previously is smaller in the
interval, largely represented by ground-glass today. A few other
tiny nodules in the right lung are stable. No entirely new nodules
on the right. Bilateral small effusions with associated atelectasis.

Upper Abdomen: The left adrenal mass measures 3.7 cm today versus
3.3 cm previously. Shotty nodes in the gastrohepatic ligament with
some increased attenuation of fat have worsened. A few other shotty
nodes are seen in the upper abdomen. Low-attenuation in the spleen
is consistent with splenic infarcts, not seen previously. No other
acute abnormalities.

Musculoskeletal: Known metastatic disease to the left humeral head.
Sclerosis is in upper right rib is stable. The T10 lesion is stable
in the interval.
IMPRESSION: 1. The patient's primary tumor in the central left upper lobe is
markedly larger in the interval. It now extends into the left lower
lobe along the fissure and bronchovascular bundle. There is
lymphangitic spread in both the upper and lower left lobes. Patchy
opacities adjacent to tumor in the left lung could represent
superimposed infection.
2. Worsening adenopathy in the mediastinum and hila.
3. Probable developing metastatic adenopathy in the upper abdomen.
4. The left adrenal mass measures a little larger in the interval.
The previous PET-CT suggested a collision metastatic lesion.
5. Splenic infarcts, new in the interval.
6. Persistent metastatic disease in the right humeral head and T10
vertebral body.
7. The 10 mm nodule in the right lung has decreased in size.
These results will be called to the ordering clinician or
representative by the Radiologist Assistant, and communication
documented in the PACS or zVision Dashboard.
